# Patient Record
Sex: Male | Born: 1935 | Race: White | Hispanic: No | Marital: Married | Smoking: Former smoker
Health system: Southern US, Community
[De-identification: ages and names within clinical notes are randomized; demographics above are authoritative.]

## PROBLEM LIST (undated history)

## (undated) DIAGNOSIS — I10 Essential (primary) hypertension: Secondary | ICD-10-CM

## (undated) DIAGNOSIS — N189 Chronic kidney disease, unspecified: Secondary | ICD-10-CM

## (undated) DIAGNOSIS — F0390 Unspecified dementia without behavioral disturbance: Secondary | ICD-10-CM

## (undated) DIAGNOSIS — I2510 Atherosclerotic heart disease of native coronary artery without angina pectoris: Secondary | ICD-10-CM

## (undated) DIAGNOSIS — R0602 Shortness of breath: Secondary | ICD-10-CM

## (undated) DIAGNOSIS — N400 Enlarged prostate without lower urinary tract symptoms: Secondary | ICD-10-CM

## (undated) DIAGNOSIS — IMO0002 Reserved for concepts with insufficient information to code with codable children: Secondary | ICD-10-CM

## (undated) DIAGNOSIS — N183 Chronic kidney disease, stage 3 unspecified: Secondary | ICD-10-CM

## (undated) DIAGNOSIS — Z8719 Personal history of other diseases of the digestive system: Secondary | ICD-10-CM

## (undated) DIAGNOSIS — M1990 Unspecified osteoarthritis, unspecified site: Secondary | ICD-10-CM

## (undated) DIAGNOSIS — E7800 Pure hypercholesterolemia, unspecified: Secondary | ICD-10-CM

## (undated) DIAGNOSIS — I340 Nonrheumatic mitral (valve) insufficiency: Secondary | ICD-10-CM

## (undated) DIAGNOSIS — Z7901 Long term (current) use of anticoagulants: Secondary | ICD-10-CM

## (undated) DIAGNOSIS — I4891 Unspecified atrial fibrillation: Secondary | ICD-10-CM

## (undated) DIAGNOSIS — N200 Calculus of kidney: Secondary | ICD-10-CM

## (undated) DIAGNOSIS — R55 Syncope and collapse: Secondary | ICD-10-CM

## (undated) DIAGNOSIS — R9430 Abnormal result of cardiovascular function study, unspecified: Secondary | ICD-10-CM

## (undated) DIAGNOSIS — I219 Acute myocardial infarction, unspecified: Secondary | ICD-10-CM

## (undated) HISTORY — PX: SATURATION BIOPSY OF PROSTATE: SHX2375

## (undated) HISTORY — DX: Chronic kidney disease, unspecified: N18.9

## (undated) HISTORY — PX: LITHOTRIPSY: SUR834

## (undated) HISTORY — DX: Benign prostatic hyperplasia without lower urinary tract symptoms: N40.0

## (undated) HISTORY — PX: TONSILLECTOMY: SUR1361

## (undated) HISTORY — DX: Syncope and collapse: R55

## (undated) HISTORY — DX: Long term (current) use of anticoagulants: Z79.01

## (undated) HISTORY — PX: TRANSURETHRAL RESECTION OF PROSTATE: SHX73

---

## 2001-12-03 ENCOUNTER — Encounter: Admission: RE | Admit: 2001-12-03 | Discharge: 2001-12-03 | Payer: Self-pay | Admitting: Urology

## 2001-12-03 ENCOUNTER — Encounter: Payer: Self-pay | Admitting: Urology

## 2001-12-09 ENCOUNTER — Encounter: Admission: RE | Admit: 2001-12-09 | Discharge: 2001-12-09 | Payer: Self-pay | Admitting: Urology

## 2001-12-09 ENCOUNTER — Encounter: Payer: Self-pay | Admitting: Urology

## 2001-12-11 ENCOUNTER — Ambulatory Visit (HOSPITAL_BASED_OUTPATIENT_CLINIC_OR_DEPARTMENT_OTHER): Admission: RE | Admit: 2001-12-11 | Discharge: 2001-12-11 | Payer: Self-pay | Admitting: Urology

## 2001-12-11 ENCOUNTER — Encounter: Payer: Self-pay | Admitting: Urology

## 2002-08-12 ENCOUNTER — Ambulatory Visit (HOSPITAL_BASED_OUTPATIENT_CLINIC_OR_DEPARTMENT_OTHER): Admission: RE | Admit: 2002-08-12 | Discharge: 2002-08-12 | Payer: Self-pay | Admitting: Urology

## 2002-08-12 HISTORY — PX: CIRCUMCISION: SHX1350

## 2005-10-22 ENCOUNTER — Emergency Department (HOSPITAL_COMMUNITY): Admission: EM | Admit: 2005-10-22 | Discharge: 2005-10-23 | Payer: Self-pay | Admitting: Emergency Medicine

## 2010-12-03 DIAGNOSIS — N189 Chronic kidney disease, unspecified: Secondary | ICD-10-CM | POA: Insufficient documentation

## 2010-12-03 DIAGNOSIS — I10 Essential (primary) hypertension: Secondary | ICD-10-CM | POA: Insufficient documentation

## 2010-12-03 HISTORY — DX: Chronic kidney disease, unspecified: N18.9

## 2010-12-27 ENCOUNTER — Emergency Department (HOSPITAL_COMMUNITY): Payer: Medicare Other

## 2010-12-27 ENCOUNTER — Observation Stay (HOSPITAL_COMMUNITY)
Admission: EM | Admit: 2010-12-27 | Discharge: 2010-12-28 | Disposition: A | Discharge status: Home / Self Care | Payer: Medicare Other | Attending: Internal Medicine | Admitting: Internal Medicine

## 2010-12-27 DIAGNOSIS — N189 Chronic kidney disease, unspecified: Secondary | ICD-10-CM | POA: Insufficient documentation

## 2010-12-27 DIAGNOSIS — Z79899 Other long term (current) drug therapy: Secondary | ICD-10-CM | POA: Insufficient documentation

## 2010-12-27 DIAGNOSIS — Z7901 Long term (current) use of anticoagulants: Secondary | ICD-10-CM | POA: Insufficient documentation

## 2010-12-27 DIAGNOSIS — N400 Enlarged prostate without lower urinary tract symptoms: Secondary | ICD-10-CM | POA: Insufficient documentation

## 2010-12-27 DIAGNOSIS — I129 Hypertensive chronic kidney disease with stage 1 through stage 4 chronic kidney disease, or unspecified chronic kidney disease: Secondary | ICD-10-CM | POA: Insufficient documentation

## 2010-12-27 DIAGNOSIS — R55 Syncope and collapse: Principal | ICD-10-CM | POA: Insufficient documentation

## 2010-12-27 DIAGNOSIS — I4891 Unspecified atrial fibrillation: Secondary | ICD-10-CM | POA: Insufficient documentation

## 2010-12-27 HISTORY — DX: Essential (primary) hypertension: I10

## 2010-12-27 LAB — POCT I-STAT, CHEM 8
BUN: 21 mg/dL (ref 6–23)
Calcium, Ion: 1.14 mmol/L (ref 1.12–1.32)
Chloride: 110 mEq/L (ref 96–112)
Creatinine, Ser: 2.1 mg/dL — ABNORMAL HIGH (ref 0.50–1.35)
Glucose, Bld: 118 mg/dL — ABNORMAL HIGH (ref 70–99)
HCT: 39 % (ref 39.0–52.0)
Hemoglobin: 13.3 g/dL (ref 13.0–17.0)
Potassium: 3.8 mEq/L (ref 3.5–5.1)
Sodium: 142 mEq/L (ref 135–145)
TCO2: 23 mmol/L (ref 0–100)

## 2010-12-27 LAB — DIFFERENTIAL
Lymphocytes Relative: 10 % — ABNORMAL LOW (ref 12–46)
Lymphs Abs: 0.8 10*3/uL (ref 0.7–4.0)
Monocytes Absolute: 0.8 10*3/uL (ref 0.1–1.0)
Monocytes Relative: 11 % (ref 3–12)
Neutro Abs: 5.8 10*3/uL (ref 1.7–7.7)
Neutrophils Relative %: 76 % (ref 43–77)

## 2010-12-27 LAB — CBC
HCT: 40.6 % (ref 39.0–52.0)
Hemoglobin: 14.3 g/dL (ref 13.0–17.0)
MCH: 31.1 pg (ref 26.0–34.0)
MCHC: 35.2 g/dL (ref 30.0–36.0)
MCV: 88.3 fL (ref 78.0–100.0)
RBC: 4.6 MIL/uL (ref 4.22–5.81)

## 2010-12-27 LAB — GLUCOSE, CAPILLARY: Glucose-Capillary: 117 mg/dL — ABNORMAL HIGH (ref 70–99)

## 2010-12-27 LAB — PROTIME-INR: Prothrombin Time: 25.3 seconds — ABNORMAL HIGH (ref 11.6–15.2)

## 2010-12-27 LAB — TROPONIN I: Troponin I: <0.3 ng/mL (ref <0.30)

## 2010-12-28 ENCOUNTER — Encounter (HOSPITAL_COMMUNITY): Payer: Self-pay | Admitting: Radiology

## 2010-12-28 ENCOUNTER — Emergency Department (HOSPITAL_COMMUNITY): Payer: Medicare Other

## 2010-12-28 DIAGNOSIS — I059 Rheumatic mitral valve disease, unspecified: Secondary | ICD-10-CM

## 2010-12-28 LAB — HEPATIC FUNCTION PANEL
Alkaline Phosphatase: 60 U/L (ref 39–117)
Indirect Bilirubin: 0.2 mg/dL — ABNORMAL LOW (ref 0.3–0.9)
Total Protein: 7.2 g/dL (ref 6.0–8.3)

## 2010-12-28 LAB — CBC
HCT: 39.1 % (ref 39.0–52.0)
Platelets: 151 10*3/uL (ref 150–400)
RBC: 4.4 MIL/uL (ref 4.22–5.81)
RDW: 13.1 % (ref 11.5–15.5)
WBC: 7.3 10*3/uL (ref 4.0–10.5)

## 2010-12-28 LAB — CK TOTAL AND CKMB (NOT AT ARMC)
CK, MB: 4.3 ng/mL — ABNORMAL HIGH (ref 0.3–4.0)
Relative Index: 1.9 (ref 0.0–2.5)
Total CK: 229 U/L (ref 7–232)

## 2010-12-28 LAB — TECHNOLOGIST SMEAR REVIEW

## 2010-12-28 LAB — CARDIAC PANEL(CRET KIN+CKTOT+MB+TROPI)
CK, MB: 3.5 ng/mL (ref 0.3–4.0)
CK, MB: 3.8 ng/mL (ref 0.3–4.0)
Relative Index: 1.9 (ref 0.0–2.5)
Total CK: 175 U/L (ref 7–232)
Troponin I: <0.3 ng/mL (ref <0.30)

## 2010-12-28 LAB — COMPREHENSIVE METABOLIC PANEL
Albumin: 3.3 g/dL — ABNORMAL LOW (ref 3.5–5.2)
BUN: 19 mg/dL (ref 6–23)
Creatinine, Ser: 1.84 mg/dL — ABNORMAL HIGH (ref 0.50–1.35)
Total Protein: 6.6 g/dL (ref 6.0–8.3)

## 2010-12-28 LAB — DIFFERENTIAL
Basophils Absolute: 0 10*3/uL (ref 0.0–0.1)
Eosinophils Absolute: 0.1 10*3/uL (ref 0.0–0.7)
Eosinophils Relative: 2 % (ref 0–5)
Lymphocytes Relative: 21 % (ref 12–46)
Neutrophils Relative %: 67 % (ref 43–77)

## 2010-12-28 LAB — D-DIMER, QUANTITATIVE: D-Dimer, Quant: 0.37 ug/mL-FEU (ref 0.00–0.48)

## 2010-12-28 LAB — MAGNESIUM: Magnesium: 1.9 mg/dL (ref 1.5–2.5)

## 2010-12-28 LAB — TSH: TSH: 1.226 u[IU]/mL (ref 0.350–4.500)

## 2010-12-29 NOTE — Discharge Summary (Signed)
Troy Gregory, Troy Gregory NO.:  [id number]  MEDICAL RECORD NO.:  [id number]  LOCATION:  3712                         FACILITY:  [facility]  PHYSICIAN:  Valetta Close, M.D.   DATE OF BIRTH:  1936/02/07  DATE OF ADMISSION:  12/27/2010 DATE OF DISCHARGE:  12/28/2010                              DISCHARGE SUMMARY   DISCHARGE DIAGNOSES: 1. Near-syncope, etiology is deemed secondary to the excessive heat. 2. Atrial fibrillation with a stable rate, on no medications. 3. Hypertension, was initially orthostatic, but now was back to being     hypotensive. 4. Chronic kidney disease with a creatinine on discharge of 1.84, was     2.1 on admission. 5. History of benign prostatic hypertrophy, but stable.  DISCHARGE MEDICATIONS:  Unchanged and include, 1. Tramadol 50 mg by mouth every 6 hours as needed for pain. 2. Lisinopril 20 mg by mouth once a day. 3. Avodart 0.5 mg by mouth once a day. 4. Coumadin 2 mg, he takes 6 mg daily for 3 days and he takes 4 mg     daily for 2 days, and he keeps alternating.  His regimen is again     unchanged.  PROCEDURES PERFORMED DURING THIS HOSPITALIZATION:  A 2-D echo that was done on December 28, 2010, which showed an EF of 50% to 55%, mildly dilated pulmonary arteries, otherwise normal.  He also had a CT scan of the head on December 28, 2010 that was negative, partially opacified left nasal cavity and ethmoid air cells correlate clinically, is concern for sinusitis which I was not.  BRIEF ADMISSION HISTORY AND PHYSICAL:  Troy Gregory is a 75 year old male with a history as previously stated who was at church outside were it was quite hot, although there was a roof and some fans.  He has been standing for a long time, then he was sitting for a long time and then while getting up he felt very dizzy and almost passed out.  He sat back in his chair.  He felt like he had vertigo.  He was very diaphoretic and because of this, he was brought to the  emergency room where he was mildly orthostatic and given fluids when he was admitted for observation.  INITIAL LABS AND VITALS:  Blood pressure 156/104, pulse 80, temperature 98.5, O2 sat 98% on room air.  White count 7.6, hemoglobin 13, hematocrit 29, platelets 143, INR 2.2.  Sodium 142, potassium 3.2, chloride 110, glucose 118, bicarb was 23, BUN 21, creatinine 2.1.  For detailed history and physical, please refer to the admission details by Dr. Toniann Fail.  HOSPITAL COURSE: 1. Near-syncope.  He was worked up for this with again the CT scan     that was negative.  A 2-D echo was negative.  We will cycle cardiac     enzymes, they have remained negative.  He is no longer orthostatic.     In fact, if anything he is just plain hypotensive with his     medications being held.  I have stopped his fluids.  He remained     stable.  He is able to stand without difficulty.  He is not having  any return of his symptoms and he had no events on telemetry.     Because of this, I will allow him to go home today.  I will resume     his home medications some of them to take it easy in the heat     outside and again he will need to follow with his primary care     physician in 1 week to have a recheck of his blood pressure.  Even     though he is a little hypotensive now, I would not start new blood     pressure medications in the acute setting, certainly there might be     a white coat hypertension affect to this, I do not commit for     hypertension.  I see no neurological deficits, I will not pursue a     neuro workup.  I really seem like this was all secondary to heat. 2. Chronic kidney disease.  This is stable.  DISCHARGE LABS AND VITALS:  Temperature 98, heart rate 74, blood pressure 160/87, O2 sat 96% on room air.  White count 17, hemoglobin 14, hematocrit 29, platelets 151.  Sodium 134, potassium 3.5, bicarb 27, BUN 19, creatinine 1.8, glucose 111, calcium 9.2, magnesium 1.9, INR  2.26. TSH normal.  Approximate length of this discharge was approximately 30 minutes.     Valetta Close, M.D.     JC/MEDQ  D:  12/28/2010  T:  12/28/2010  Job:  [id number]  cc:   Courtney Paris, M.D. Windle Guard, M.D.  Electronically Signed by Valetta Close M.D. on 12/29/2010 01:58:42 PM

## 2011-02-06 NOTE — H&P (Signed)
NAMELUVERN, MCISAAC NO.:  [id number]  MEDICAL RECORD NO.:  [id number]  LOCATION:  [facility]                         FACILITY:  [facility]  PHYSICIAN:  Eduard Clos, MDDATE OF BIRTH:  [birth year]  DATE OF ADMISSION:  12/27/2010 DATE OF DISCHARGE:                             HISTORY & PHYSICAL   PRIMARY CARE PHYSICIAN:  Windle Guard, MD, [place].  CHIEF COMPLAINT:  Almost passed out.  HISTORY OF PRESENTING ILLNESS:  A 75 year old male, who was recently diagnosed with atrial fibrillation and is on Coumadin, was at church last evening around 8:30 when he was trying to stand up from a sitting position.  He felt very dizzy and almost passed out.  He sat back in the chair, had a spinning sensation but did not lose consciousness.  Denies any chest pain or shortness of breath or nausea, vomiting, or abdominal pain.  Denies any dysuria, discharges, or diarrhea, any focal deficit, headache, or visual symptoms.  Denies any difficulty speaking or swallowing.  In the ER, the patient was found to be mildly orthostatic and was given a 500 mL normal saline bolus.  At this time, the patient is admitted for further observation.  PAST MEDICAL HISTORY: 1. Recently diagnosed atrial fibrillation on Coumadin. 2. History of hypertension. 3. History of chronic kidney disease. 4. History of BPH.  PAST SURGICAL HISTORY:  None as per patient.  MEDICATIONS ON ADMISSION:  The patient is on Coumadin.  As per the chart, the patient is on Avodart, lisinopril, and tramadol.  We need to verify all this.  ALLERGIES:  No known drug allergies.  FAMILY HISTORY:  Significant for stroke and he states his father had some type of cancer which he cannot recall.  SOCIAL HISTORY:  The patient denies smoking cigarettes, drinking alcohol, or using illegal drugs.  REVIEW OF SYSTEMS:  As present in the history of presenting illness. Nothing else significant.  PHYSICAL EXAMINATION:   GENERAL:  The patient is examined at bedside, not in acute distress. VITAL SIGNS:  Blood pressure is 156/104, pulse 80 per minute, temperature 98.5, respirations 18 per minute, O2 sat is 98%.  HEENT: Anicteric.  No pallor.  No facial asymmetry.  No discharge from ears, eyes, nose, or mouth.  PERRLA positive.  Tongue is midline. CHEST:  Bilateral air entry present.  No rhonchi.  No crepitation. HEART:  S1, S2 heard. ABDOMEN:  Soft, nontender.  Bowel sounds heard. CNS:  The patient is alert, awake, oriented to time, place, and person. Moves upper and lower extremities 5/5.  There is no pronator drift.  No dysdiadochokinesia or any ataxia. EXTREMITIES:  Peripheral pulses felt.  No edema.  LABORATORY DATA:  EKG shows atrial fibrillation, rate is around 85 beats per minute with nonspecific ST-T changes.  Chest x-ray shows no acute disease.  CBC:  WBC is 7.6, hemoglobin is 13.3, hematocrit 39, platelets 143.  PT/INR is 25.3 and 2.2.  Basic metabolic panel:  Sodium 142, potassium 3.2, chloride 110, glucose 118, bicarb was 23, BUN 21, creatinine 2.1, CK-MB is 4.3, troponin less than 0.3.  ASSESSMENT: 1. Near syncope. 2. Recently diagnosed atrial fibrillation, is on Coumadin.  Presently  rate is relatively controlled. 3. Thrombocytopenia. 4. Chronic kidney disease.  His creatinine in 2007 was 2.2, presently     is 2.1. 5. History of hypertension, presently uncontrolled. 6. History of benign prostatic hypertrophy.  PLAN: 1. At this time, admit the patient to telemetry for further monitoring     to be able to rule out any arrhythmias or worsening of his AFib. 2. Near syncope.  The patient was found to be orthostatic.  The     patient was given some IV fluids.  We will continue with gentle     hydration at this time.  I am going to do cycle cardiac markers,     check D-dimer.  We will get a CT of the head without contrast, also     get a 2-D echo. 3. The patient does have  thrombocytopenia.  I am going to get     peripheral smear to check for schistocytes.  If there are     schistocytes then we need to get a Hematology consult for further     addressing his thrombocytopenia.  Presently, the patient does not     have any purpuric rash or fever. 4. Chronic kidney disease.  We need to closely follow his creatinine.     We will place the patient on strict intake, output and daily     weights. 5. Uncontrolled hypertension.  At this time, keeping the patient on     p.r.n. labetalol.  We need to verify his home medication and     continue if clinically appropriate. 6. Further recommendation as condition evolves.     Eduard Clos, MD     ANK/MEDQ  D:  12/28/2010  T:  12/28/2010  Job:  [id number]  cc:   Windle Guard, M.D.  Electronically Signed by Midge Minium MD on 02/06/2011 08:53:35 AM

## 2011-04-22 ENCOUNTER — Encounter (HOSPITAL_COMMUNITY): Payer: Self-pay

## 2011-04-22 ENCOUNTER — Other Ambulatory Visit: Payer: Self-pay

## 2011-04-22 ENCOUNTER — Emergency Department (HOSPITAL_COMMUNITY): Payer: Medicare Other

## 2011-04-22 ENCOUNTER — Emergency Department (HOSPITAL_COMMUNITY)
Admission: EM | Admit: 2011-04-22 | Discharge: 2011-04-22 | Disposition: A | Discharge status: Home / Self Care | Payer: Medicare Other | Attending: Emergency Medicine | Admitting: Emergency Medicine

## 2011-04-22 DIAGNOSIS — Z79899 Other long term (current) drug therapy: Secondary | ICD-10-CM | POA: Insufficient documentation

## 2011-04-22 DIAGNOSIS — I959 Hypotension, unspecified: Secondary | ICD-10-CM | POA: Insufficient documentation

## 2011-04-22 DIAGNOSIS — I10 Essential (primary) hypertension: Secondary | ICD-10-CM | POA: Insufficient documentation

## 2011-04-22 DIAGNOSIS — R404 Transient alteration of awareness: Secondary | ICD-10-CM | POA: Insufficient documentation

## 2011-04-22 DIAGNOSIS — R55 Syncope and collapse: Secondary | ICD-10-CM | POA: Insufficient documentation

## 2011-04-22 DIAGNOSIS — R0602 Shortness of breath: Secondary | ICD-10-CM | POA: Insufficient documentation

## 2011-04-22 DIAGNOSIS — Z7901 Long term (current) use of anticoagulants: Secondary | ICD-10-CM | POA: Insufficient documentation

## 2011-04-22 DIAGNOSIS — I4891 Unspecified atrial fibrillation: Secondary | ICD-10-CM | POA: Insufficient documentation

## 2011-04-22 DIAGNOSIS — R059 Cough, unspecified: Secondary | ICD-10-CM | POA: Insufficient documentation

## 2011-04-22 DIAGNOSIS — R05 Cough: Secondary | ICD-10-CM | POA: Insufficient documentation

## 2011-04-22 HISTORY — DX: Unspecified atrial fibrillation: I48.91

## 2011-04-22 LAB — PROTIME-INR
INR: 2.42 — ABNORMAL HIGH (ref 0.00–1.49)
Prothrombin Time: 26.7 seconds — ABNORMAL HIGH (ref 11.6–15.2)

## 2011-04-22 LAB — DIFFERENTIAL
Basophils Relative: 1 % (ref 0–1)
Monocytes Relative: 9 % (ref 3–12)
Neutro Abs: 6.1 10*3/uL (ref 1.7–7.7)
Neutrophils Relative %: 76 % (ref 43–77)

## 2011-04-22 LAB — BASIC METABOLIC PANEL
BUN: 22 mg/dL (ref 6–23)
Calcium: 9.2 mg/dL (ref 8.4–10.5)
Creatinine, Ser: 1.94 mg/dL — ABNORMAL HIGH (ref 0.50–1.35)
GFR calc non Af Amer: 33 mL/min — ABNORMAL LOW (ref >90)
Glucose, Bld: 101 mg/dL — ABNORMAL HIGH (ref 70–99)

## 2011-04-22 LAB — POCT I-STAT TROPONIN I: Troponin i, poc: 0 ng/mL (ref 0.00–0.08)

## 2011-04-22 LAB — CBC
Hemoglobin: 14.5 g/dL (ref 13.0–17.0)
MCH: 31.6 pg (ref 26.0–34.0)
MCHC: 34.2 g/dL (ref 30.0–36.0)
Platelets: 188 10*3/uL (ref 150–400)

## 2011-04-22 MED ORDER — SODIUM CHLORIDE 0.9 % IV BOLUS (SEPSIS)
500.0000 mL | Freq: Once | INTRAVENOUS | Status: AC
  Administered 2011-04-22: 500 mL via INTRAVENOUS

## 2011-04-22 NOTE — ED Notes (Signed)
Pt returned from xray, pt resting. NAD noted at this time.

## 2011-04-22 NOTE — ED Provider Notes (Signed)
History     CSN: [id number] Arrival date & time: 04/22/2011 12:51 PM   First MD Initiated Contact with Patient 04/22/11 1254    pt seen at 1300  Chief Complaint  Patient presents with  . Hypotension  . Loss of Consciousness    Patient is a 74 y.o. male presenting with syncope. The history is provided by the patient and the spouse.  Loss of Consciousness This is a new problem. The current episode started less than 1 hour ago. The problem occurs rarely. The problem has been resolved. Pertinent negatives include no chest pain. Associated symptoms comments: Reports recent cough/sob this past week . The symptoms are aggravated by nothing. The symptoms are relieved by nothing. Treatments tried: Iv fluids. The treatment provided moderate relief.   Pt was at church when he "slumped over" per wife and was unresponsive for about 2 minutes He ate normal meal this AM, but recently started lasix No seizure No head injury Now back to baseline after IV fluids Recent cough/congestion but no active CP at this time No focal weakness No Ha H/o syncope in the past, admitted earlier this yr for similar episode  Past Medical History  Diagnosis Date  . Hypertension   . Atrial fibrillation     History reviewed. No pertinent past surgical history.  History reviewed. No pertinent family history.  History  Substance Use Topics  . Smoking status: Former Smoker -- 45 years  . Smokeless tobacco: Not on file  . Alcohol Use: No      Review of Systems  Cardiovascular: Positive for syncope. Negative for chest pain.  All other systems reviewed and are negative.    Allergies  Review of patient's allergies indicates no known allergies.  Home Medications   Current Outpatient Rx  Name Route Sig Dispense Refill  . DUTASTERIDE 0.5 MG PO CAPS Oral Take 0.5 mg by mouth daily.      . FUROSEMIDE 40 MG PO TABS Oral Take 40 mg by mouth daily.     Marland Kitchen LISINOPRIL 20 MG PO TABS Oral Take 20 mg by mouth  daily.      Marland Kitchen NIACIN 100 MG PO TABS Oral Take 100 mg by mouth daily with breakfast.      . TRAMADOL HCL 50 MG PO TABS Oral Take 50 mg by mouth every 6 (six) hours as needed. Maximum dose= 8 tablets per day     . WARFARIN SODIUM 4 MG PO TABS Oral Take 4 mg by mouth daily.        BP 112/84  Pulse 79  Temp(Src) 97.6 F (36.4 C) (Oral)  Resp 16  SpO2 97%  Physical Exam  CONSTITUTIONAL: Well developed/well nourished HEAD AND FACE: Normocephalic/atraumatic EYES: EOMI/PERRL ENMT: Mucous membranes moist NECK: supple no meningeal signs SPINE:entire spine nontender CV: irregular no murmurs/rubs/gallops noted LUNGS: Lungs are clear to auscultation bilaterally, no apparent distress ABDOMEN: soft, nontender, no rebound or guarding GU:no cva tenderness NEURO: Pt is awake/alert, moves all extremitiesx4 No arm drift, facies symmetric EXTREMITIES: pulses normal, full ROM SKIN: warm, color normal PSYCH: no abnormalities of mood noted   ED Course  Procedures (   Labs Reviewed  CBC  DIFFERENTIAL  POCT I-STAT TROPONIN I  BASIC METABOLIC PANEL  I-STAT TROPONIN I  PROTIME-INR   Dg Chest 2 View  04/22/2011  *RADIOLOGY REPORT*  Clinical Data: Syncope.  Shortness of breath.  Hypertension.  CHEST - 2 VIEW  Comparison: 02/28/2011  Findings: Mild thoracic spondylosis noted.  There is  mild tortuosity of the thoracic aorta.  Borderline cardiomegaly noted.  No significant pulmonary abnormality is observed.  No pleural effusion noted.  IMPRESSION:  1.  Borderline cardiomegaly, without edema. 2.  Thoracic spondylosis. 3.  Aortic tortuosity.  Original Report Authenticated By: Dellia Cloud, M.D.    4:05 PM Pt observed in the ED Feels improved Taking PO, ambulatory, no hypotension No cp reported No HA/weakness reported He had extensive workup earlier this yr, no signs of aortic stenosis/CHF He has f/u with cardiology soon, and can f/u with PCP this week He recently had lasix started, I  asked him to hold this I offered admission, but he would rather go home since he had similar episode last July and negative workup BP 142/102  Pulse 100  Temp(Src) 97.6 F (36.4 C) (Oral)  Resp 22  SpO2 95%     MDM  Nursing notes reviewed and considered in documentation All labs/vitals reviewed and considered Previous records reviewed and considered - admission for syncope 12/2010, and had echo that showed EF at 50%, h/o afib. He is on coumadin, not on rate control meds xrays reviewed and considered    Date: 04/22/2011  Rate: 78  Rhythm: atrial fibrillation  QRS Axis: normal  Intervals: normal  ST/T Wave abnormalities: nonspecific ST changes  Conduction Disutrbances:none  Narrative Interpretation:   Old EKG Reviewed: unchanged from 12/27/10          Joya Gaskins, MD 04/22/11 1610

## 2011-04-22 NOTE — ED Notes (Signed)
Pt resting. NAD noted at this time. Family at bedside.

## 2011-04-22 NOTE — ED Notes (Signed)
Per ems- pt experienced syncopal episode in church this am. Upon arrival ems could not palpate or auscultate BP, howevber, after bolus, BP went up to 94/72. Pt has 18G IV in Left wrist. Pt is currently a&o x4.

## 2011-04-22 NOTE — ED Notes (Signed)
Patient transported to X-ray 

## 2011-04-22 NOTE — ED Notes (Signed)
Family at bedside. NAD noted at this time. Pt states no needs at this time.

## 2011-04-25 ENCOUNTER — Encounter: Payer: Self-pay | Admitting: Cardiology

## 2011-04-30 ENCOUNTER — Encounter: Payer: Self-pay | Admitting: Cardiology

## 2011-05-07 ENCOUNTER — Encounter: Payer: Self-pay | Admitting: Cardiology

## 2011-05-07 ENCOUNTER — Ambulatory Visit (INDEPENDENT_AMBULATORY_CARE_PROVIDER_SITE_OTHER): Payer: Medicare Other | Admitting: Cardiology

## 2011-05-07 VITALS — BP 110/60 | HR 89 | Ht 72.0 in | Wt 177.8 lb

## 2011-05-07 DIAGNOSIS — R0600 Dyspnea, unspecified: Secondary | ICD-10-CM

## 2011-05-07 DIAGNOSIS — I480 Paroxysmal atrial fibrillation: Secondary | ICD-10-CM | POA: Insufficient documentation

## 2011-05-07 DIAGNOSIS — N189 Chronic kidney disease, unspecified: Secondary | ICD-10-CM

## 2011-05-07 DIAGNOSIS — R0602 Shortness of breath: Secondary | ICD-10-CM

## 2011-05-07 DIAGNOSIS — R55 Syncope and collapse: Secondary | ICD-10-CM | POA: Insufficient documentation

## 2011-05-07 DIAGNOSIS — I10 Essential (primary) hypertension: Secondary | ICD-10-CM

## 2011-05-07 DIAGNOSIS — I4891 Unspecified atrial fibrillation: Secondary | ICD-10-CM

## 2011-05-07 NOTE — Assessment & Plan Note (Signed)
There is no overt evidence of congestive heart failure. Diuretic therapy has not improved his symptoms of dyspnea. I think this is only exacerbated his syncope. I recommended stopping his Lasix at this point and we will focus more on rhythm management. We will obtain a nuclear stress test to rule out ischemic heart disease.

## 2011-05-07 NOTE — Assessment & Plan Note (Signed)
We need to rule out significant bradycardia or pauses. Given the fact that he has a normal heart rate on no rate slowing medications suggest that he has electrical system disease of the heart. We will await the results of his event monitor. I have held his Lasix for the time being since this may exacerbate his symptoms.

## 2011-05-07 NOTE — Progress Notes (Signed)
[name] Date of Birth: May 10, 1936 Medical Record #[id number]  History of Present Illness: Mr. Alcock is seen at the request of Dr. Jeannetta Nap for evaluation of dyspnea. He is a 75 year old white male who reports he was in good health until May of this year when he developed atrial fibrillation. He was started on Coumadin at that time. He has not required any rate control medications. Over the past one to 2 months he reports increasing symptoms of dyspnea. He complains of marked fatigue. He has passed out twice at church. One episode occurred in July and another 2 weeks ago. Both times he did present to the hospital for evaluation. He's had extensive evaluation including blood work, echocardiogram, and CT of the head. His echocardiogram showed an ejection fraction of 50-55%. There was mild to moderate left atrial enlargement. There was mild pulmonary hypertension. His CT scan was unremarkable. Recent BNP level was elevated at 450. He was started on Lasix prior to his last episode of passing out. Since then his Lasix dose was reduced. He has noted no improvement in his dyspnea with Lasix. He did have a recent chest x-ray which showed no edema. He's had no increase in orthopnea or edema. In fact his weight has declined progressively at a rate of 4-5 pounds per month.  Current Outpatient Prescriptions on File Prior to Visit  Medication Sig Dispense Refill  . dutasteride (AVODART) 0.5 MG capsule Take 0.5 mg by mouth daily.       . furosemide (LASIX) 80 MG tablet Take 40 mg by mouth daily.        Marland Kitchen lisinopril (PRINIVIL,ZESTRIL) 20 MG tablet Take 20 mg by mouth daily.        . niacin 100 MG tablet Take 100 mg by mouth daily with breakfast.        . warfarin (COUMADIN) 2 MG tablet Take 4-6 mg by mouth daily. Take 6mg  on mon and thurs, 4mg  all other days.         No Known Allergies  Past Medical History  Diagnosis Date  . Hypertension 12/2010    was initially orthostatic, but now was back to being  hypotensive  . Atrial fibrillation     with a stable rate, on no medications  . Syncope     Near-syncope, etiology is deemed secondary to the excessive heat  . Chronic kidney disease 12/2010    with a creatinine on discharge of 1.84, was  2.1 on admission  . Benign prostatic hypertrophy     but stable    Past Surgical History  Procedure Date  . Circumcision 08/12/2002    because of phimosis    History  Smoking status  . Former Smoker -- 45 years  Smokeless tobacco  . Not on file    History  Alcohol Use No    Family History  Problem Relation Age of Onset  . Cancer Father   . Lung cancer Father   . Diabetes Father   . Hypertension Mother   . Hypertension Brother     Review of Systems: The review of systems is positive for fatigue.  He's had no chest pain or palpitations. He is currently wearing an event monitor. All other systems were reviewed and are negative.  Physical Exam: BP 110/60  Pulse 89  Ht 6' (1.829 m)  Wt 177 lb 12.8 oz (80.65 kg)  BMI 24.11 kg/m2 He is a pleasant elderly male in no acute distress.The patient is alert and oriented x  3.  The mood and affect are normal.  The skin is warm and dry.  Color is normal.  The HEENT exam reveals that the sclera are nonicteric.  The mucous membranes are moist.  The carotids are 2+ without bruits.  There is no thyromegaly.  There is no JVD.  The lungs are clear.  The chest wall is non tender.  The heart exam reveals an irregular rate with a normal S1 and S2.  There are no murmurs, gallops, or rubs.  The PMI is not displaced.   Abdominal exam reveals good bowel sounds.  There is no guarding or rebound.  There is no hepatosplenomegaly or tenderness.  There are no masses.  Exam of the legs reveal no clubbing, cyanosis, or edema.  The legs are without rashes.  The distal pulses are intact.  Cranial nerves II - XII are intact.  Motor and sensory functions are intact.  The gait is normal.  LABORATORY DATA: ECG today  demonstrates atrial fibrillation with voltage criteria for LVH. There is nonspecific T-wave abnormality. His INRs for the past month have been therapeutic.  Assessment / Plan:

## 2011-05-07 NOTE — Patient Instructions (Signed)
We will schedule you for a nuclear stress test.  Stop taking Lasix for now.  Continue your other medication.  Continue wearing the monitor.  I will have you see Lawson Fiscal back in 2 weeks.

## 2011-05-07 NOTE — Assessment & Plan Note (Signed)
Rate is well controlled on no rate slowing medication. He is on chronic anticoagulation. I am concerned that his atrial fibrillation is the primary cause of his dyspnea although he has no overt evidence of CHF. He is to continue with his event monitor. I recommended a nuclear stress test to rule out ischemic heart disease. We will see him back in 2 weeks. If these studies are unremarkable I would plan on proceeding with a DC cardioversion. Given the fact that he has been in atrial fibrillation for the past 6-7 months I think it is likely he will require antiarrhythmic drug therapy to maintain sinus rhythm. If his stress test is normal then flecainide might be a good choice. Given his chronic renal insufficiency he is not a good candidate for sotalol or Tikosyn.

## 2011-05-09 ENCOUNTER — Other Ambulatory Visit (HOSPITAL_COMMUNITY): Payer: Medicare Other | Admitting: Radiology

## 2011-05-09 ENCOUNTER — Ambulatory Visit (HOSPITAL_COMMUNITY): Payer: Medicare Other | Attending: Cardiovascular Disease | Admitting: Radiology

## 2011-05-09 VITALS — Ht 72.0 in | Wt 178.0 lb

## 2011-05-09 DIAGNOSIS — Z87891 Personal history of nicotine dependence: Secondary | ICD-10-CM | POA: Insufficient documentation

## 2011-05-09 DIAGNOSIS — R0989 Other specified symptoms and signs involving the circulatory and respiratory systems: Secondary | ICD-10-CM | POA: Insufficient documentation

## 2011-05-09 DIAGNOSIS — R55 Syncope and collapse: Secondary | ICD-10-CM

## 2011-05-09 DIAGNOSIS — I10 Essential (primary) hypertension: Secondary | ICD-10-CM | POA: Insufficient documentation

## 2011-05-09 DIAGNOSIS — Z8249 Family history of ischemic heart disease and other diseases of the circulatory system: Secondary | ICD-10-CM | POA: Insufficient documentation

## 2011-05-09 DIAGNOSIS — R0609 Other forms of dyspnea: Secondary | ICD-10-CM | POA: Insufficient documentation

## 2011-05-09 DIAGNOSIS — I4891 Unspecified atrial fibrillation: Secondary | ICD-10-CM

## 2011-05-09 DIAGNOSIS — I2789 Other specified pulmonary heart diseases: Secondary | ICD-10-CM | POA: Insufficient documentation

## 2011-05-09 DIAGNOSIS — R0602 Shortness of breath: Secondary | ICD-10-CM

## 2011-05-09 DIAGNOSIS — R5381 Other malaise: Secondary | ICD-10-CM | POA: Insufficient documentation

## 2011-05-09 MED ORDER — TECHNETIUM TC 99M TETROFOSMIN IV KIT
11.0000 | PACK | Freq: Once | INTRAVENOUS | Status: AC | PRN
  Administered 2011-05-09: 11 via INTRAVENOUS

## 2011-05-09 MED ORDER — REGADENOSON 0.4 MG/5ML IV SOLN
0.4000 mg | Freq: Once | INTRAVENOUS | Status: AC
  Administered 2011-05-09: 0.4 mg via INTRAVENOUS

## 2011-05-09 MED ORDER — TECHNETIUM TC 99M TETROFOSMIN IV KIT
33.0000 | PACK | Freq: Once | INTRAVENOUS | Status: AC | PRN
  Administered 2011-05-09: 33 via INTRAVENOUS

## 2011-05-09 NOTE — Progress Notes (Signed)
[facility] SITE 3 NUCLEAR MED [address] [place] [address] [phone]  Cardiology Nuclear Med Study  Troy Gregory is a 75 y.o. male [id number] 18-Apr-1936   Nuclear Med Background Indication for Stress Test:  Evaluation for Ischemia History: 12/22/10 Echo:EF=50-55%, mild pulmonary HTN, new afib. Since 5/12 Cardiac Risk Factors: Family History - CAD, History of Smoking and Hypertension  Symptoms:  DOE, Fatigue and Syncope   Nuclear Pre-Procedure Caffeine/Decaff Intake:  None NPO After: 8:00pm   Lungs:  Clear.  O2 SAT 97% on RA IV 0.9% NS with Angio Cath:  20g  IV Site: R Antecubital  IV Started by:  Bonnita Levan, RN  Chest Size (in):  44 Cup Size: N/A  Height: 6' (1.829 m)  Weight:  178 lb (80.74 kg)  BMI:  Body mass index is 24.14 kg/(m^2). Tech Comments:  N/A    Nuclear Med Study 1 or 2 day study: 1 day  Stress Test Type:  Lexiscan  Reading MD: Kristeen Miss, MD  Order Authorizing Provider:  Peter [place], MD  Resting Radionuclide: Technetium 88m Tetrofosmin  Resting Radionuclide Dose: 11.0 mCi   Stress Radionuclide:  Technetium 43m Tetrofosmin  Stress Radionuclide Dose: 33.0 mCi           Stress Protocol Rest HR: 83 Stress HR: 99  Rest BP: 110/60 Stress BP: 120/64  Exercise Time (min): n/a METS: n/a   Predicted Max HR: 147 bpm % Max HR: 67.35 bpm Rate Pressure Product: 21308   Dose of Adenosine (mg):  n/a Dose of Lexiscan: 0.4 mg  Dose of Atropine (mg): n/a Dose of Dobutamine: n/a mcg/kg/min (at max HR)  Stress Test Technologist: Smiley Houseman, CMA-N  Nuclear Technologist:  Domenic Polite, CNMT     Rest Procedure:  Myocardial perfusion imaging was performed at rest 45 minutes following the intravenous administration of Technetium 46m Tetrofosmin.  Rest ECG: Atrial Fibrilliation with occasional PVC's.  Stress Procedure:  The patient received IV Lexiscan 0.4 mg over 15-seconds.  Technetium 73m Tetrofosmin injected at 30-seconds.   There were no significant changes with Lexiscan, other than frequent PVC's.  Quantitative spect images were obtained after a 45 minute delay.  Stress ECG: No significant change from baseline ECG  QPS Raw Data Images:  Normal; no motion artifact; normal heart/lung ratio. Stress Images:  There is mild apical thinning with normal uptake in other regions. Rest Images:  There is mild apical thinning with normal uptake in other regions. Subtraction (SDS):  No evidence of ischemia. Transient Ischemic Dilatation (Normal <1.22):  1.03 Lung/Heart Ratio (Normal <0.45):  0.39  Quantitative Gated Spect Images QGS EDV:  207 ml QGS ESV:  159 ml QGS cine images:  There is severe, global LV dysfunction.  The LV is markedly dilated. QGS EF: 23%  Impression Exercise Capacity:  Lexiscan with no exercise. BP Response:  Normal blood pressure response. Clinical Symptoms:  No chest pain. ECG Impression:  No significant ST segment change suggestive of ischemia. Comparison with Prior Nuclear Study: No previous nuclear study performed  Overall Impression:  Abnormal stress nuclear study.  There is no evidence of ischemia.  The LV is markedly dilated and hypocontractile with an EF of 23%.   [name], [name]., MD, [facility] 05/09/2011, 6:25 PM

## 2011-05-11 ENCOUNTER — Institutional Professional Consult (permissible substitution): Payer: Medicare Other | Admitting: Cardiology

## 2011-05-11 NOTE — Progress Notes (Signed)
lmtcb

## 2011-05-14 ENCOUNTER — Telehealth: Payer: Self-pay | Admitting: *Deleted

## 2011-05-14 ENCOUNTER — Other Ambulatory Visit: Payer: Self-pay | Admitting: Cardiology

## 2011-05-14 ENCOUNTER — Other Ambulatory Visit: Payer: Self-pay | Admitting: *Deleted

## 2011-05-14 ENCOUNTER — Telehealth: Payer: Self-pay | Admitting: Cardiology

## 2011-05-14 DIAGNOSIS — I4891 Unspecified atrial fibrillation: Secondary | ICD-10-CM

## 2011-05-14 NOTE — Telephone Encounter (Signed)
New message:  Wife called and would like to get results of test done on Weds.  Please call her back with results.

## 2011-05-14 NOTE — Telephone Encounter (Signed)
Message copied by Lorayne Bender on Mon May 14, 2011 12:12 PM ------      Message from: [place], PETER M      Created: Fri May 11, 2011  1:06 PM       No ischemia. EF is markedly decreased at 23%. This is quite a drop from his Echo in July 2012. I reviewed Echo- images limited. I think we need to repeat Echo with Definity contrast.       Peter [place]

## 2011-05-14 NOTE — Telephone Encounter (Signed)
Message copied by Lorayne Bender on Mon May 14, 2011  3:03 PM ------      Message from: [place], PETER M      Created: Fri May 11, 2011  1:06 PM       No ischemia. EF is markedly decreased at 23%. This is quite a drop from his Echo in July 2012. I reviewed Echo- images limited. I think we need to repeat Echo with Definity contrast.       Peter [place]

## 2011-05-14 NOTE — Telephone Encounter (Signed)
Correction. Spoke w/wife and gave results of stress test. Will send to Dr. Jeannetta Nap.

## 2011-05-14 NOTE — Telephone Encounter (Signed)
Spoke w/wife and gave results of echo. Advised will need to get a limited Echo to evaluate EF. Will send order to Scheduler to call her back.

## 2011-05-14 NOTE — Telephone Encounter (Signed)
Wife called back requesting stress test results. Advised Dr. Swaziland would like a limited Echo w/Definity. Will send request for schedulers. Wife states anytime OK.

## 2011-05-16 ENCOUNTER — Ambulatory Visit (HOSPITAL_COMMUNITY): Payer: Medicare Other | Attending: Cardiology | Admitting: Radiology

## 2011-05-16 ENCOUNTER — Other Ambulatory Visit (HOSPITAL_COMMUNITY): Payer: Medicare Other | Admitting: Radiology

## 2011-05-16 DIAGNOSIS — I059 Rheumatic mitral valve disease, unspecified: Secondary | ICD-10-CM | POA: Insufficient documentation

## 2011-05-16 DIAGNOSIS — I359 Nonrheumatic aortic valve disorder, unspecified: Secondary | ICD-10-CM | POA: Insufficient documentation

## 2011-05-16 DIAGNOSIS — I4891 Unspecified atrial fibrillation: Secondary | ICD-10-CM | POA: Insufficient documentation

## 2011-05-16 DIAGNOSIS — I079 Rheumatic tricuspid valve disease, unspecified: Secondary | ICD-10-CM | POA: Insufficient documentation

## 2011-05-17 ENCOUNTER — Telehealth: Payer: Self-pay | Admitting: *Deleted

## 2011-05-17 NOTE — Telephone Encounter (Signed)
Notified wife of Echo results. To see Lawson Fiscal on Monday with Dr. Sw[place]iland to discuss further treatment. Will send copy to Dr. Jeannetta Nap.

## 2011-05-17 NOTE — Telephone Encounter (Signed)
Message copied by Lorayne Bender on Thu May 17, 2011  9:40 AM ------      Message from: [place], PETER M      Created: Wed May 16, 2011  1:02 PM       EF has decreased significantly since July. Now severely reduced. We need to see him back to optimize his medical therapy and to consider cath.      Theron Arista [place]

## 2011-05-17 NOTE — Telephone Encounter (Signed)
Lm that the call was about his Echo results which I had called her about this AM

## 2011-05-17 NOTE — Telephone Encounter (Signed)
FU CAll: Pt wife returning call from our office from yesterday. Pt wife doesn't know if call was regarding results of pt ECHO. Please return pt call to discuss further.

## 2011-05-21 ENCOUNTER — Encounter: Payer: Self-pay | Admitting: Nurse Practitioner

## 2011-05-21 ENCOUNTER — Encounter (HOSPITAL_COMMUNITY): Payer: Self-pay | Admitting: Pharmacy Technician

## 2011-05-21 ENCOUNTER — Ambulatory Visit (INDEPENDENT_AMBULATORY_CARE_PROVIDER_SITE_OTHER): Payer: Medicare Other | Admitting: Nurse Practitioner

## 2011-05-21 ENCOUNTER — Other Ambulatory Visit: Payer: Self-pay | Admitting: Nurse Practitioner

## 2011-05-21 ENCOUNTER — Other Ambulatory Visit: Payer: Self-pay | Admitting: *Deleted

## 2011-05-21 DIAGNOSIS — I5031 Acute diastolic (congestive) heart failure: Secondary | ICD-10-CM

## 2011-05-21 DIAGNOSIS — I5020 Unspecified systolic (congestive) heart failure: Secondary | ICD-10-CM

## 2011-05-21 DIAGNOSIS — R0989 Other specified symptoms and signs involving the circulatory and respiratory systems: Secondary | ICD-10-CM

## 2011-05-21 DIAGNOSIS — R0600 Dyspnea, unspecified: Secondary | ICD-10-CM

## 2011-05-21 DIAGNOSIS — R55 Syncope and collapse: Secondary | ICD-10-CM

## 2011-05-21 DIAGNOSIS — R0609 Other forms of dyspnea: Secondary | ICD-10-CM

## 2011-05-21 DIAGNOSIS — I4891 Unspecified atrial fibrillation: Secondary | ICD-10-CM

## 2011-05-21 NOTE — Assessment & Plan Note (Addendum)
He remains in atrial fib. He is on coumadin but we are going to stop and arrange for left/right heart catheterization.   His INR on 12/17 was 3.2. Coumadin was held. He was told on 12/18 to increase his greens. Did not get the message. Spoke with Dr. Sw[place]iland. Will give 5 mg of Vitamin K x one dose. Recheck INR here Friday am. Dr. Clifton James will proceed with an INR of 1.8 or less.

## 2011-05-21 NOTE — Assessment & Plan Note (Signed)
He has an event monitor in place per Dr. Jeannetta Nap.

## 2011-05-21 NOTE — Patient Instructions (Addendum)
Stop taking your coumadin. We will check your labs today.   We are going to arrange for a heart catheterization this Friday with Dr. Clifton James at 9:30am  You are scheduled for an outpatient cardiac catheterization on Friday with Dr. Clifton James or associates.  Go the [facility] at [facility] this Friday at 8:30am.  Call the Heart & Vascular Center at [phone] if you are unable to make your appointment.  The code to get into the parking garage under the building is 0006. You must have someone available to drive you home. Someone needs to be with you for the first 24 hours after you arrive home. Please wear clothes that are easy to get on and off.  Do not eat or drink after midnight on Thursday. You may have water only with your medications on the morning of your procedure.    Coronary Angiography Coronary angiography is an X-ray procedure used to look at the arteries in the heart. In this procedure, a dye is injected through a long, hollow tube (catheter). The catheter is about the size of a piece of cooked spaghetti. The catheter injects a dye into an artery in your groin. X-rays are then taken to show if there is a blockage in the arteries of your heart. BEFORE THE PROCEDURE   Let your caregiver know if you have allergies to shellfish or contrast dye. Also let your caregiver know if you have kidney problems or failure.   Do not eat or drink starting from midnight up to the time of the procedure, or as directed.   You may drink enough water to take your medications the morning of the procedure if you were instructed to do so.   You should be at the hospital or outpatient facility where the procedure is to be done 60 minutes prior to the procedure or as directed.  PROCEDURE  You may be given an IV medication to help you relax before the procedure.   You will be prepared for the procedure by washing and shaving the area where the catheter will be inserted. This is usually  done in the groin but may be done in the fold of your arm by your elbow.   A medicine will be given to numb your groin where the catheter will be inserted.   A specially trained doctor will insert the catheter into an artery in your groin. The catheter is guided by using a special type of X-ray (fluoroscopy) to the blood vessel being examined.   A special dye is then injected into the catheter and X-rays are taken. The dye helps to show where any narrowing or blockages are located in the heart arteries.  AFTER THE PROCEDURE   After the procedure you will be kept in bed lying flat for several hours. You will be instructed to not bend or cross your legs.   The groin insertion site will be watched and checked frequently.   The pulse in your feet will be checked frequently.   Additional blood tests, X-rays and an EKG may be done.   You may stay in the hospital overnight for observation.  SEEK IMMEDIATE MEDICAL CARE IF:   You develop chest pain, shortness of breath, feel faint, or pass out.   There is bleeding, swelling, or drainage from the catheter insertion site.   You develop pain, discoloration, coldness, or severe bruising in the leg or area where the catheter was inserted.   You have a fever.  Document  Released: 11/25/2002 Document Revised: 01/31/2011 Document Reviewed: 01/14/2008 [facility] Patient Information 2012 [place], [place].

## 2011-05-21 NOTE — Progress Notes (Signed)
 Osmar P Paino Date of Birth: 05/12/1936 Medical Record #[id number]  History of Present Illness: Troy Gregory is seen today for a work in visit. He is seen with Dr. Jordan. He has atrial fibrillation that was noted back in May. Coumadin was started. He did not require rate control. He has had 2 episodes of syncope occuring at church. One was in July and the second was back in November. He has an event monitor in place. He has had extensive evaluation including blood work, echo and CT of the head. His echo at that time showed an EF of 50 to 55%. He had mild to moderate LAE and mild pulmonary HTN. BNP recently was elevated at 450. He has actually lost weight. He has been taken off of his Lasix. He then had a myoview which showed no ischemia but a marked reduction of his EF at 23%. He then had an echo with definity showing reduced EF as well. We are now referring on for left and right heart catheterization.   His wife notes that he tends to downplay his symptoms. She notes more DOE. He is fatigued. He sits most of the day. No swelling. Appetite is poor. Does not feel bloated. No cough/PND/orthopnea. No chest pain reported.   Current Outpatient Prescriptions on File Prior to Visit  Medication Sig Dispense Refill  . dutasteride (AVODART) 0.5 MG capsule Take 0.5 mg by mouth daily.       . lisinopril (PRINIVIL,ZESTRIL) 20 MG tablet Take 20 mg by mouth daily.        . warfarin (COUMADIN) 2 MG tablet Take 4-6 mg by mouth daily. Take 6mg on mon and thurs, 4mg all other days.         No Known Allergies  Past Medical History  Diagnosis Date  . Hypertension 12/2010    was initially orthostatic, but now was back to being hypotensive  . Atrial fibrillation     with a stable rate, on no medications  . Syncope     Near-syncope, etiology is deemed secondary to the excessive heat  . Chronic kidney disease 12/2010    with a creatinine on discharge of 1.84, was  2.1 on admission  . Benign prostatic hypertrophy    but stable    Past Surgical History  Procedure Date  . Circumcision 08/12/2002    because of phimosis    History  Smoking status  . Former Smoker -- 45 years  Smokeless tobacco  . Not on file    History  Alcohol Use No    Family History  Problem Relation Age of Onset  . Cancer Father   . Lung cancer Father   . Diabetes Father   . Hypertension Mother   . Hypertension Brother     Review of Systems: The review of systems is per the HPI.  All other systems were reviewed and are negative.  Physical Exam: BP 128/104  Pulse 94  Ht 6' (1.829 m)  Wt 178 lb (80.74 kg)  BMI 24.14 kg/m2 Patient is very pleasant and in no acute distress. He does look ill. Skin is warm and dry. Color is normal.  HEENT is unremarkable. Normocephalic/atraumatic. PERRL. Sclera are nonicteric. Neck is supple. No masses. No JVD. Lungs are clear. Cardiac exam shows an irregular rhythm. Abdomen is soft. Extremities are without edema. Gait and ROM are intact. No gross neurologic deficits noted.    Lab Results  Component Value Date   WBC 8.1 04/22/2011   HGB 14.5 04/22/2011     HCT 42.4 04/22/2011   PLT 188 04/22/2011   GLUCOSE 101* 04/22/2011   ALT 16 12/28/2010   AST 21 12/28/2010   NA 142 04/22/2011   K 3.7 04/22/2011   CL 101 04/22/2011   CREATININE 1.94* 04/22/2011   BUN 22 04/22/2011   CO2 30 04/22/2011   TSH 1.226 12/28/2010   INR 2.42* 04/22/2011   Dg Chest 2 View  04/22/2011  *RADIOLOGY REPORT*  Clinical Data: Syncope.  Shortness of breath.  Hypertension.  CHEST - 2 VIEW  Comparison: 02/28/2011  Findings: Mild thoracic spondylosis noted.  There is mild tortuosity of the thoracic aorta.  Borderline cardiomegaly noted.  No significant pulmonary abnormality is observed.  No pleural effusion noted.  IMPRESSION:  1.  Borderline cardiomegaly, without edema. 2.  Thoracic spondylosis. 3.  Aortic tortuosity.  Original Report Authenticated By: WALTER D. LIEBKEMANN, M.D.   EKG today shows atrial  fib. Rate is 94. Tracing is reviewed with Dr. Jordan.   Assessment / Plan:  

## 2011-05-21 NOTE — Assessment & Plan Note (Signed)
Now noted to have marked reduction in his EF. We are proceeding on with left/right heart cath. Can hold on the ventriculogram due to his kidney function. He would need a staged procedure if PCI is warranted. Further disposition to follow.

## 2011-05-22 ENCOUNTER — Telehealth: Payer: Self-pay | Admitting: *Deleted

## 2011-05-22 ENCOUNTER — Telehealth: Payer: Self-pay | Admitting: Cardiology

## 2011-05-22 LAB — CBC WITH DIFFERENTIAL/PLATELET
Basophils Absolute: 0.1 10*3/uL (ref 0.0–0.1)
Basophils Relative: 0.7 % (ref 0.0–3.0)
Eosinophils Absolute: 0.2 10*3/uL (ref 0.0–0.7)
Eosinophils Relative: 2 % (ref 0.0–5.0)
HCT: 41.9 % (ref 39.0–52.0)
Hemoglobin: 14.3 g/dL (ref 13.0–17.0)
Lymphocytes Relative: 15.2 % (ref 12.0–46.0)
Lymphs Abs: 1.2 10*3/uL (ref 0.7–4.0)
MCHC: 34.1 g/dL (ref 30.0–36.0)
MCV: 92.3 fl (ref 78.0–100.0)
Monocytes Absolute: 0.8 10*3/uL (ref 0.1–1.0)
Monocytes Relative: 10.5 % (ref 3.0–12.0)
Neutro Abs: 5.7 10*3/uL (ref 1.4–7.7)
Neutrophils Relative %: 71.6 % (ref 43.0–77.0)
Platelets: 158 10*3/uL (ref 150.0–400.0)
RBC: 4.54 Mil/uL (ref 4.22–5.81)
RDW: 13 % (ref 11.5–14.6)
WBC: 7.9 10*3/uL (ref 4.5–10.5)

## 2011-05-22 LAB — TSH: TSH: 1.38 u[IU]/mL (ref 0.35–5.50)

## 2011-05-22 LAB — BASIC METABOLIC PANEL
BUN: 25 mg/dL — ABNORMAL HIGH (ref 6–23)
CO2: 29 mEq/L (ref 19–32)
Calcium: 9.3 mg/dL (ref 8.4–10.5)
Chloride: 107 mEq/L (ref 96–112)
Creatinine, Ser: 2 mg/dL — ABNORMAL HIGH (ref 0.4–1.5)
GFR: 35.17 mL/min — ABNORMAL LOW (ref >60.00)
Glucose, Bld: 108 mg/dL — ABNORMAL HIGH (ref 70–99)
Potassium: 4.4 mEq/L (ref 3.5–5.1)
Sodium: 143 mEq/L (ref 135–145)

## 2011-05-22 LAB — PROTIME-INR
INR: 3.2 ratio — ABNORMAL HIGH (ref 0.8–1.0)
Prothrombin Time: 35.8 s — ABNORMAL HIGH (ref 10.2–12.4)

## 2011-05-22 LAB — APTT: aPTT: 35.7 s — ABNORMAL HIGH (ref 21.7–28.8)

## 2011-05-22 NOTE — Telephone Encounter (Signed)
Spoke w/wife. Advised to be here in our office at 7:45 am Friday. Will check INR here and if therapeutic will send to [facility] cath lab as planned.

## 2011-05-22 NOTE — Telephone Encounter (Signed)
Fu call Pt's wife was calling back from yesterday

## 2011-05-22 NOTE — Telephone Encounter (Signed)
Lm at home # for him to eat A LOT of green vegetables today and tomorrow. INR was 3.2. Lm to call back to make sure they got message. Will repeat INR Friday AM.

## 2011-05-23 ENCOUNTER — Telehealth: Payer: Self-pay | Admitting: *Deleted

## 2011-05-23 MED ORDER — PHYTONADIONE 5 MG PO TABS: 5.0000 mg | ORAL_TABLET | Freq: Once | ORAL | Status: DC

## 2011-05-23 NOTE — Telephone Encounter (Signed)
Notified of lab results. Advised to eat a lot of greens today and Thursday. Per Dr. [place] will give Vitamin K 5 mg PO today.

## 2011-05-23 NOTE — Telephone Encounter (Signed)
Message copied by Lorayne Bender on Wed May 23, 2011 12:45 PM ------      Message from: Rosalio Macadamia      Created: Wed May 23, 2011 12:36 PM       Have reviewed with Dr. Sw[place]il[place] and Dr. Clifton James. Hoping for INR less than or equal to 1.8 on Friday. Was told to increase his intake of greens. Coumadin is currently on hold.  No LV gram ordered.

## 2011-05-24 NOTE — Telephone Encounter (Signed)
Spoke w/wife. States he did take Vitamin K last PM and has eaten [place] and broccoli last PM and today. Will be here at 7:45 in AM for INR check and then will decide if will have cath.

## 2011-05-24 NOTE — Telephone Encounter (Signed)
Fu call °Pt returning your call  °

## 2011-05-25 ENCOUNTER — Inpatient Hospital Stay (HOSPITAL_BASED_OUTPATIENT_CLINIC_OR_DEPARTMENT_OTHER)
Admission: RE | Admit: 2011-05-25 | Discharge: 2011-05-25 | Disposition: A | Discharge status: Home / Self Care | Payer: Medicare Other | Source: Ambulatory Visit | Attending: Cardiovascular Disease | Admitting: Cardiovascular Disease

## 2011-05-25 ENCOUNTER — Encounter (HOSPITAL_BASED_OUTPATIENT_CLINIC_OR_DEPARTMENT_OTHER)
Admission: RE | Disposition: A | Discharge status: Home / Self Care | Payer: Self-pay | Source: Ambulatory Visit | Attending: Cardiovascular Disease

## 2011-05-25 ENCOUNTER — Other Ambulatory Visit: Payer: Self-pay | Admitting: Cardiovascular Disease

## 2011-05-25 ENCOUNTER — Ambulatory Visit (INDEPENDENT_AMBULATORY_CARE_PROVIDER_SITE_OTHER): Payer: Self-pay | Admitting: *Deleted

## 2011-05-25 DIAGNOSIS — I129 Hypertensive chronic kidney disease with stage 1 through stage 4 chronic kidney disease, or unspecified chronic kidney disease: Secondary | ICD-10-CM | POA: Insufficient documentation

## 2011-05-25 DIAGNOSIS — I4891 Unspecified atrial fibrillation: Secondary | ICD-10-CM

## 2011-05-25 DIAGNOSIS — I2510 Atherosclerotic heart disease of native coronary artery without angina pectoris: Secondary | ICD-10-CM

## 2011-05-25 DIAGNOSIS — I509 Heart failure, unspecified: Secondary | ICD-10-CM | POA: Insufficient documentation

## 2011-05-25 DIAGNOSIS — R0989 Other specified symptoms and signs involving the circulatory and respiratory systems: Secondary | ICD-10-CM

## 2011-05-25 DIAGNOSIS — N189 Chronic kidney disease, unspecified: Secondary | ICD-10-CM | POA: Insufficient documentation

## 2011-05-25 LAB — POCT I-STAT 3, ART BLOOD GAS (G3+)
Bicarbonate: 23.7 mEq/L (ref 20.0–24.0)
pCO2 arterial: 36.5 mmHg (ref 35.0–45.0)
pH, Arterial: 7.422 (ref 7.350–7.450)
pO2, Arterial: 63 mmHg — ABNORMAL LOW (ref 80.0–100.0)

## 2011-05-25 LAB — POCT I-STAT 3, VENOUS BLOOD GAS (G3P V): pH, Ven: 7.349 — ABNORMAL HIGH (ref 7.250–7.300)

## 2011-05-25 LAB — POCT INR: INR: 1.4

## 2011-05-25 SURGERY — JV LEFT HEART CATHETERIZATION WITH CORONARY ANGIOGRAM
Anesthesia: Moderate Sedation

## 2011-05-25 SURGERY — JV LEFT AND RIGHT HEART CATHETERIZATION WITH CORONARY ANGIOGRAM
Anesthesia: Moderate Sedation

## 2011-05-25 MED ORDER — ASPIRIN 81 MG PO CHEW
324.0000 mg | CHEWABLE_TABLET | ORAL | Status: AC
  Administered 2011-05-25: 324 mg via ORAL

## 2011-05-25 MED ORDER — DIAZEPAM 5 MG PO TABS
5.0000 mg | ORAL_TABLET | ORAL | Status: AC
  Administered 2011-05-25: 5 mg via ORAL

## 2011-05-25 MED ORDER — SODIUM CHLORIDE 0.9 % IV SOLN
INTRAVENOUS | Status: DC
  Administered 2011-05-25: 09:00:00 via INTRAVENOUS

## 2011-05-25 MED ORDER — SODIUM CHLORIDE 0.9 % IV SOLN: 1.0000 mL/kg/h | INTRAVENOUS | Status: DC

## 2011-05-25 MED ORDER — ATORVASTATIN CALCIUM 40 MG PO TABS: 40.0000 mg | ORAL_TABLET | Freq: Every day | ORAL | Status: DC

## 2011-05-25 MED ORDER — ONDANSETRON HCL 4 MG/2ML IJ SOLN: 4.0000 mg | Freq: Four times a day (QID) | INTRAMUSCULAR | Status: DC | PRN

## 2011-05-25 MED ORDER — ACETAMINOPHEN 325 MG PO TABS: 650.0000 mg | ORAL_TABLET | ORAL | Status: DC | PRN

## 2011-05-25 NOTE — Op Note (Signed)
    Cardiac Cath Note  Troy Gregory [id number] 12-12-1935  Procedure: Right and left Heart Cardiac Catheterization Note Indications: Congestive heart failure  Procedure Details Consent: Obtained Time Out: Verified patient identification, verified procedure, site/side was marked, verified correct patient position, special equipment/implants available, Radiology Safety Procedures followed,  medications/allergies/relevent history reviewed, required imaging and test results available.  Performed  The right femoral artery was easily canulated using a modified Seldinger technique.  Hemodynamics:   RA: 12/11/9 RV: 38/11 PCWP: 25/24/20 ( prominent V waves suggestive of TR) PA:  42/21/30  Cardiac Output   Thermodilution: 2.5 lpm, index of 1.2  Fick : 3.7 lpm, index 1.8  Arterial Sat: 92 PA Sat: 57.  LV pressure: 103/18 Aortic pressure: 101/81  Angiography :  It was very difficult to cannulate the left main. We ended up using a Judkins left 5 catheter. It is very difficult to maintain catheter position.  We used 60 cc of contrast for angiography  Left Main: The left main has mild irregularities in the proximal and midsegment. There is a 70% stenosis in the distal left main.  This stenosis actually continues for and involves the origin of the left circumflex artery and left anterior descending artery.  Left anterior Descending: There's a 70% irregular stenosis in the origin of the left anterior descending artery. The stenosis actually involves the left main and origin of the left circumflex artery.  The LAD has moderate irregularities throughout its course. It gives off a large first diagonal artery that has mild regularities. The second diagonal artery has a 50-60% stenosis at its proximal segment.  There is a discrete 60-70% stenosis in the distal left anterior descending artery.  There is a large septal perforator branch that has a 70-80% stenosis at its origin. This septal perforator  provides most of the filling to the intraventricular septum.  Left Circumflex: The left complex artery is relatively small vessel. There is a 70% stenosis at its origin. There is a 80-90% stenosis in the distal circumflex artery just prior to giving off a small obtuse marginal branch.  Right Coronary Artery: Right coronary artery was also very difficult to engage. There is a hazy stenosis at the origin of the right coronary artery that is associated with a 20-30% stenosis at its origin. The right coronary artery is very dilated and ectatic. The posterior descending artery has a 40-50% stenosis at its origin.  LV Gram: No left ventriculogram was performed because of a creatinine of 2.0. We measured pressures in the left ventricle and then an LV/AO pullback. Complications: No apparent complications Patient did tolerate procedure well.  Conclusions:    1. Coronary artery disease: The patient has significant coronary artery disease involving the left main, left anterior descending artery, and left circumflex artery. These lesions are nonmovable 2 percutaneous coronary intervention and. We'll consult the CV surgeons.  2. Congestive heart failure:  The patient has a known ejection fraction of 20-25% by echo. His cardiac output is moderately to severely depressed by Fick and thermodilution calculation.    3. Chronic renal insufficiency: The patient's baseline creatinine is 2.0. We tried to use a minimal amount of contrast. We used 60 cc of contrast during the case. We had considerable difficulty keeping the cath was engaged in the coronary arteries.   [name], [name]., MD, [facility] 05/25/2011, 10:53 AM

## 2011-05-25 NOTE — H&P (View-Only) (Signed)
[name] Date of Birth: April 08, 1936 Medical Record #[id number]  History of Present Illness: Nikan is seen today for a work in visit. He is seen with Dr. Swaziland. He has atrial fibrillation that was noted back in May. Coumadin was started. He did not require rate control. He has had 2 episodes of syncope occuring at church. One was in July and the second was back in November. He has an event monitor in place. He has had extensive evaluation including blood work, echo and CT of the head. His echo at that time showed an EF of 50 to 55%. He had mild to moderate LAE and mild pulmonary HTN. BNP recently was elevated at 450. He has actually lost weight. He has been taken off of his Lasix. He then had a myoview which showed no ischemia but a marked reduction of his EF at 23%. He then had an echo with definity showing reduced EF as well. We are now referring on for left and right heart catheterization.   His wife notes that he tends to downplay his symptoms. She notes more DOE. He is fatigued. He sits most of the day. No swelling. Appetite is poor. Does not feel bloated. No cough/PND/orthopnea. No chest pain reported.   Current Outpatient Prescriptions on File Prior to Visit  Medication Sig Dispense Refill  . dutasteride (AVODART) 0.5 MG capsule Take 0.5 mg by mouth daily.       Marland Kitchen lisinopril (PRINIVIL,ZESTRIL) 20 MG tablet Take 20 mg by mouth daily.        Marland Kitchen warfarin (COUMADIN) 2 MG tablet Take 4-6 mg by mouth daily. Take 6mg  on mon and thurs, 4mg  all other days.         No Known Allergies  Past Medical History  Diagnosis Date  . Hypertension 12/2010    was initially orthostatic, but now was back to being hypotensive  . Atrial fibrillation     with a stable rate, on no medications  . Syncope     Near-syncope, etiology is deemed secondary to the excessive heat  . Chronic kidney disease 12/2010    with a creatinine on discharge of 1.84, was  2.1 on admission  . Benign prostatic hypertrophy    but stable    Past Surgical History  Procedure Date  . Circumcision 08/12/2002    because of phimosis    History  Smoking status  . Former Smoker -- 45 years  Smokeless tobacco  . Not on file    History  Alcohol Use No    Family History  Problem Relation Age of Onset  . Cancer Father   . Lung cancer Father   . Diabetes Father   . Hypertension Mother   . Hypertension Brother     Review of Systems: The review of systems is per the HPI.  All other systems were reviewed and are negative.  Physical Exam: BP 128/104  Pulse 94  Ht 6' (1.829 m)  Wt 178 lb (80.74 kg)  BMI 24.14 kg/m2 Patient is very pleasant and in no acute distress. He does look ill. Skin is warm and dry. Color is normal.  HEENT is unremarkable. Normocephalic/atraumatic. PERRL. Sclera are nonicteric. Neck is supple. No masses. No JVD. Lungs are clear. Cardiac exam shows an irregular rhythm. Abdomen is soft. Extremities are without edema. Gait and ROM are intact. No gross neurologic deficits noted.    Lab Results  Component Value Date   WBC 8.1 04/22/2011   HGB 14.5 04/22/2011  HCT 42.4 04/22/2011   PLT 188 04/22/2011   GLUCOSE 101* 04/22/2011   ALT 16 12/28/2010   AST 21 12/28/2010   NA 142 04/22/2011   K 3.7 04/22/2011   CL 101 04/22/2011   CREATININE 1.94* 04/22/2011   BUN 22 04/22/2011   CO2 30 04/22/2011   TSH 1.226 12/28/2010   INR 2.42* 04/22/2011   Dg Chest 2 View  04/22/2011  *RADIOLOGY REPORT*  Clinical Data: Syncope.  Shortness of breath.  Hypertension.  CHEST - 2 VIEW  Comparison: 02/28/2011  Findings: Mild thoracic spondylosis noted.  There is mild tortuosity of the thoracic aorta.  Borderline cardiomegaly noted.  No significant pulmonary abnormality is observed.  No pleural effusion noted.  IMPRESSION:  1.  Borderline cardiomegaly, without edema. 2.  Thoracic spondylosis. 3.  Aortic tortuosity.  Original Report Authenticated By: Dellia Cloud, M.D.   EKG today shows atrial  fib. Rate is 94. Tracing is reviewed with Dr. [place].   Assessment / Plan:

## 2011-05-25 NOTE — Progress Notes (Signed)
Bedrest begins @ 1050.

## 2011-05-25 NOTE — Brief Op Note (Signed)
    Cardiac Cath Note  Troy Gregory [id number] 08/19/1935  Procedure: Right and left Heart Cardiac Catheterization Note Indications: Congestive heart failure  Procedure Details Consent: Obtained Time Out: Verified patient identification, verified procedure, site/side was marked, verified correct patient position, special equipment/implants available, Radiology Safety Procedures followed,  medications/allergies/relevent history reviewed, required imaging and test results available.  Performed  The right femoral artery was easily canulated using a modified Seldinger technique.  Hemodynamics:   RA: 12/11/9 RV: 38/11 PCWP: 25/24/20 ( prominent V waves suggestive of TR) PA:  42/21/30  Cardiac Output   Thermodilution: 2.5 lpm, index of 1.2  Fick : 3.7 lpm, index 1.8  Arterial Sat: 92 PA Sat: 57.  LV pressure: 103/18 Aortic pressure: 101/81  Angiography :  It was very difficult to cannulate the left main. We ended up using a Judkins left 5 catheter. It is very difficult to maintain catheter position.  We used 60 cc of contrast for angiography  Left Main: The left main has mild irregularities in the proximal and midsegment. There is a 70% stenosis in the distal left main.  This stenosis actually continues for and involves the origin of the left circumflex artery and left anterior descending artery.  Left anterior Descending: There's a 70% irregular stenosis in the origin of the left anterior descending artery. The stenosis actually involves the left main and origin of the left circumflex artery.  The LAD has moderate irregularities throughout its course. It gives off a large first diagonal artery that has mild regularities. The second diagonal artery has a 50-60% stenosis at its proximal segment.  There is a discrete 60-70% stenosis in the distal left anterior descending artery.  There is a large septal perforator branch that has a 70-80% stenosis at its origin. This septal perforator  provides most of the filling to the intraventricular septum.  Left Circumflex: The left complex artery is relatively small vessel. There is a 70% stenosis at its origin. There is a 80-90% stenosis in the distal circumflex artery just prior to giving off a small obtuse marginal branch.  Right Coronary Artery: Right coronary artery was also very difficult to engage. There is a hazy stenosis at the origin of the right coronary artery that is associated with a 20-30% stenosis at its origin. The right coronary artery is very dilated and ectatic. The posterior descending artery has a 40-50% stenosis at its origin.  LV Gram: No left ventriculogram was performed because of a creatinine of 2.0. We measured pressures in the left ventricle and then an LV/AO pullback. Complications: No apparent complications Patient did tolerate procedure well.  Conclusions:    1. Coronary artery disease: The patient has significant coronary artery disease involving the left main, left anterior descending artery, and left circumflex artery. These lesions are nonmovable 2 percutaneous coronary intervention and. We'll consult the CV surgeons.  2. Congestive heart failure:  The patient has a known ejection fraction of 20-25% by echo. His cardiac output is moderately to severely depressed by Fick and thermodilution calculation.    3. Chronic renal insufficiency: The patient's baseline creatinine is 2.0. We tried to use a minimal amount of contrast. We used 60 cc of contrast during the case. We had considerable difficulty keeping the cath was engaged in the coronary arteries.   Troy Gregory J. Troy Gregory, Jr., MD, FACC 05/25/2011, 10:53 AM   

## 2011-05-25 NOTE — Interval H&P Note (Signed)
History and Physical Interval Note:  05/25/2011 9:31 AM  Troy Gregory  has presented today for surgery, with the diagnosis of Chest pain  The various methods of treatment have been discussed with the patient and family. After consideration of risks, benefits and other options for treatment, the patient has consented to  Procedure(s): JV LEFT HEART CATHETERIZATION WITH CORONARY ANGIOGRAM as a surgical intervention .  The patients' history has been reviewed, patient examined, no change in status, stable for surgery.  I have reviewed the patients' chart and labs.  Questions were answered to the patient's satisfaction.    INR is 1.4.  Discussed case with family.  Will proceed with R and L heart cath.  Risks, benefits, options discussed.    [name].

## 2011-05-25 NOTE — Brief Op Note (Signed)
    Cardiac Cath Note  Troy Gregory [id number] 07/15/1935  Procedure: Right and left Heart Cardiac Catheterization Note Indications: Congestive heart failure  Procedure Details Consent: Obtained Time Out: Verified patient identification, verified procedure, site/side was marked, verified correct patient position, special equipment/implants available, Radiology Safety Procedures followed,  medications/allergies/relevent history reviewed, required imaging and test results available.  Performed  The right femoral artery was easily canulated using a modified Seldinger technique.  Hemodynamics:   RA: 12/11/9 RV: 38/11 PCWP: 25/24/20 ( prominent V waves suggestive of TR) PA:  42/21/30  Cardiac Output   Thermodilution: 2.5 lpm, index of 1.2  Fick : 3.7 lpm, index 1.8  Arterial Sat: 92 PA Sat: 57.  LV pressure: 103/18 Aortic pressure: 101/81  Angiography :  It was very difficult to cannulate the left main. We ended up using a Judkins left 5 catheter. It is very difficult to maintain catheter position.  We used 60 cc of contrast for angiography  Left Main: The left main has mild irregularities in the proximal and midsegment. There is a 70% stenosis in the distal left main.  This stenosis actually continues for and involves the origin of the left circumflex artery and left anterior descending artery.  Left anterior Descending: There's a 70% irregular stenosis in the origin of the left anterior descending artery. The stenosis actually involves the left main and origin of the left circumflex artery.  The LAD has moderate irregularities throughout its course. It gives off a large first diagonal artery that has mild regularities. The second diagonal artery has a 50-60% stenosis at its proximal segment.  There is a discrete 60-70% stenosis in the distal left anterior descending artery.  There is a large septal perforator branch that has a 70-80% stenosis at its origin. This septal perforator  provides most of the filling to the intraventricular septum.  Left Circumflex: The left complex artery is relatively small vessel. There is a 70% stenosis at its origin. There is a 80-90% stenosis in the distal circumflex artery just prior to giving off a small obtuse marginal branch.  Right Coronary Artery: Right coronary artery was also very difficult to engage. There is a hazy stenosis at the origin of the right coronary artery that is associated with a 20-30% stenosis at its origin. The right coronary artery is very dilated and ectatic. The posterior descending artery has a 40-50% stenosis at its origin.  LV Gram: No left ventriculogram was performed because of a creatinine of 2.0. We measured pressures in the left ventricle and then an LV/AO pullback. Complications: No apparent complications Patient did tolerate procedure well.  Conclusions:    1. Coronary artery disease: The patient has significant coronary artery disease involving the left main, left anterior descending artery, and left circumflex artery. These lesions are nonmovable 2 percutaneous coronary intervention and. We'll consult the CV surgeons.  2. Congestive heart failure:  The patient has a known ejection fraction of 20-25% by echo. His cardiac output is moderately to severely depressed by Fick and thermodilution calculation.    3. Chronic renal insufficiency: The patient's baseline creatinine is 2.0. We tried to use a minimal amount of contrast. We used 60 cc of contrast during the case. We had considerable difficulty keeping the cath was engaged in the coronary arteries.   [name], [name]., MD, [facility] 05/25/2011, 10:40 AM

## 2011-05-30 ENCOUNTER — Telehealth: Payer: Self-pay | Admitting: Cardiology

## 2011-05-30 MED ORDER — NITROGLYCERIN 0.4 MG SL SUBL: 0.4000 mg | SUBLINGUAL_TABLET | SUBLINGUAL | Status: DC | PRN

## 2011-05-30 NOTE — Telephone Encounter (Signed)
Wife calling wanting to know if appointment had been made w/surgeons. Spoke w/Dr. Elease Hashimoto who did his cath on Fri 12/21. He states he did call consult into [facility] on Friday but would call them again today. Advised wife that surgeons office should be getting in touch with them. Dr. Elease Hashimoto wanted to be sure he had some NTG. Wife states he did not have any NTG so sent Rx into [facility]. Also LM w/Dee Talley,RN at [facility] with wife's work number.

## 2011-05-30 NOTE — Telephone Encounter (Signed)
Fu call pts wife is calling back. She hasnt heard anything

## 2011-05-30 NOTE — Telephone Encounter (Signed)
New message:  Pt had cath on Friday and was told he would need by pass surgery.  Went home but has had a rough week end with shortness of breath.  Please call and refer to a surgeon as soon as possible.  Please call wife back and advise when this is done.

## 2011-06-01 ENCOUNTER — Encounter (HOSPITAL_COMMUNITY): Payer: Self-pay | Admitting: Pharmacy Technician

## 2011-06-01 ENCOUNTER — Institutional Professional Consult (permissible substitution) (INDEPENDENT_AMBULATORY_CARE_PROVIDER_SITE_OTHER): Payer: Medicare Other | Admitting: Cardiothoracic Surgery

## 2011-06-01 ENCOUNTER — Other Ambulatory Visit: Payer: Self-pay

## 2011-06-01 ENCOUNTER — Encounter: Payer: Self-pay | Admitting: Cardiothoracic Surgery

## 2011-06-01 VITALS — BP 112/84 | HR 52 | Resp 18 | Ht 72.0 in | Wt 177.0 lb

## 2011-06-01 DIAGNOSIS — I2510 Atherosclerotic heart disease of native coronary artery without angina pectoris: Secondary | ICD-10-CM

## 2011-06-01 DIAGNOSIS — I4891 Unspecified atrial fibrillation: Secondary | ICD-10-CM

## 2011-06-01 DIAGNOSIS — I059 Rheumatic mitral valve disease, unspecified: Secondary | ICD-10-CM

## 2011-06-01 NOTE — Progress Notes (Signed)
[address]            [name] [address]          [phone]      Troy Gregory [facility] Medical Record #[id number] Date of Birth: 1936-04-24  Referring: Nahser, Tiajuana Amass., MD Primary Cardiology:Dr. Peter Swaziland, MD Primary Care: Kaleen Mask, MD  Chief Complaint:    Chief Complaint  Patient presents with  . Coronary Artery Disease    Referral from Dr Elease Hashimoto for eval CABG, cath'ed 05/22/11    History of Present Illness:    Patient  is 75 yo with no known history of CAD presents with 100 lb weight lass over two years, onset of atrial fib in may 2012, and two documented syncopal episodes in July and Nov of this  Year. He has had over past several months progressive SOB with an with out exertion, PND frequently, 2 pillow ortho[pnea. He denies lower extremity edema or chest pain. He has no history of prior MI    Current Activity/ Functional Status: Patient is  independent with mobility/ambulation, transfers, ADL's, IADL's.    Past Medical History  Diagnosis Date  . Hypertension 12/2010    was initially orthostatic, but now was back to being hypotensive  . Campath-induced atrial fibrillation May 2012    with a stable rate, on no medications  . Syncope Two episodes July and Nov episode in Nov precipitated Cardiology evaluation      . Benign prostatic hypertrophy Elevated PSA to 13 by patient history bx done "several" years ago    but stable  . Chronic anticoagulation   . Coronary artery disease New DX since cath   . Mitral regurgitation   . Shortness of breath   . Chronic kidney disease 12/2010    with a creatinine on discharge of 1.84, was  2.1 on admission  . Calculus of kidney   . H/O hiatal hernia     Past Surgical History  Procedure Date  . Circumcision 08/12/2002    because of phimosis   Prostate BX   Family History  Problem Relation Age of Onset       . Lung cancer Father 62   . Diabetes Father   .  Hypertension- died in sleep age 15 on coumadin for AFIB Mother   . Hypertension Brother        Daughter with lupus      History   Social History  . Marital Status: Married         Number of Children: 2  . Years of Education: N/A   Occupational History  . 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 Smoking status: Former Smoker --quit age 53  .    Marland Kitchen Alcohol Use: No  . Drug Use: No   Other Topics Concern  . Not on file   Social History Narrative  . No narrative on file      No Known Allergies  Current Outpatient Prescriptions  Medication Sig Dispense Refill  . dutasteride (AVODART) 0.5 MG capsule Take 0.5 mg by mouth daily.       . furosemide (LASIX) 80 MG tablet Take 40 mg by mouth every morning.       Marland Kitchen lisinopril (PRINIVIL,ZESTRIL) 20 MG tablet Take 20 mg by mouth every morning.       Marland Kitchen  warfarin (COUMADIN) 2 MG tablet Take 4-6 mg by mouth every evening. Take 4mg  daily, then on day 5 take 6mg .      . atorvastatin (LIPITOR) 40 MG tablet Take 40 mg by mouth every morning.        . nitroGLYCERIN (NITROSTAT) 0.4 MG SL tablet Place 0.4 mg under the tongue every 5 (five) minutes as needed. FOR CHEST PAIN         Review of Systems  Constitutional: Positive for weight loss and malaise/fatigue. Negative for fever, chills and diaphoresis.  Eyes: Negative.   Respiratory: Positive for cough and shortness of breath. Negative for wheezing.   Cardiovascular: Positive for palpitations, orthopnea and PND. Negative for chest pain and leg swelling.  Gastrointestinal: Negative for heartburn, nausea, abdominal pain, diarrhea, constipation, blood in stool and melena.  Genitourinary: Negative.   Musculoskeletal: Positive for joint pain.  Skin: Negative for itching and rash.  Neurological: Positive for dizziness, loss of consciousness and weakness. Negative for focal weakness and seizures.  Psychiatric/Behavioral: Negative.   Regular dental  care No history of colonoscopy  Physical Exam: BP 112/84  Pulse 52  Resp 18  Ht 6' (1.829 m)  Wt 177 lb (80.287 kg)  BMI 24.01 kg/m2  SpO2 95%  General appearance: alert, cooperative, appears older than stated age, cachectic and no distress Neurologic: intact no carotid bruits Heart: irregularly irregular rhythm and no rub Lungs: clear to auscultation bilaterally Abdomen: soft, non-tender; bowel sounds normal; no masses,  no organomegaly Extremities: extremities normal, atraumatic, no cyanosis or edema, Homans sign is negative, no sign of DVT, no edema, redness or tenderness in the calves or thighs and no ulcers, gangrene or trophic changes Palpable distal pulses in feet bilateral No palpable AAA  Diagnostic Studies & Laboratory data:     Recent Radiology Findings:   Dg Chest 2 View  06/04/2011  *RADIOLOGY REPORT*  Clinical Data: Preop evaluation.  History of coronary artery disease.  History of mitral valvular prolapse.  For CABG and mitral valvular repair.  History of hypertension and atrial fibrillation. History of hiatal hernia.  CHEST - 2 VIEW  Comparison: 04/22/2011.  Findings: There is slight enlargement of the cardiac silhouette. Mediastinal and hilar contours appear stable.  No pulmonary edema, pneumonia, or pleural effusion is seen.  There is osteophyte formation in the spine.  IMPRESSION: Slight enlargement of the cardiac silhouette.  No pulmonary edema, pneumonia, or other acute abnormality is seen.  Original Report Authenticated By: Crawford Givens, M.D.      Recent Lab Findings: Lab Results  Component Value Date   WBC 7.0 06/04/2011   HGB 14.8 06/04/2011   HCT 44.0 06/04/2011   PLT 153 06/04/2011   GLUCOSE 180* 06/04/2011   ALT 27 06/04/2011   AST 28 06/04/2011   NA 140 06/04/2011   K 3.3* 06/04/2011   CL 102 06/04/2011   CREATININE 1.60* 06/04/2011   BUN 22 06/04/2011   CO2 24 06/04/2011   TSH 1.38 05/21/2011   INR 1.53* 06/04/2011   HGBA1C 6.1* 06/04/2011    Echo 05/16/11 Transthoracic Echocardiography  Patient: Gregory, Troy MR #: [id number] Study Date: 05/16/2011 Gender: M Age: 60 Height: 182.9cm Weight: 80.3kg BSA: 2.57m^2 Pt. Status: Room:  ATTENDING Peter Swaziland, MD [facility] Peter Swaziland, MD REFERRING Peter Swaziland, MD PERFORMING [facility] SONOGRAPHER Junious Dresser, RDCS cc:  ------------------------------------------------------------ LV EF: 20% - 25%  ------------------------------------------------------------ Indications: Atrial fibrillation - 427.31. Re-evaluate LV function.  ------------------------------------------------------------ History: PMH: Acquired from the patient  and from the patient's chart. Fatigue, syncope, and dyspnea. Atrial fibrillation. Left ventricular dysfunction, with an ejection fraction of 23%by radionuclide ventriculography. Mild mitral regurgitation. Renal disease. Risk factors: Hypertension.  ------------------------------------------------------------ Study Conclusions  - Left ventricle: The cavity size was normal. There was mild focal basal hypertrophy of the septum. Systolic function was severely reduced. The estimated ejection fraction was in the range of 20% to 25%. Diffuse hypokinesis. - Aortic valve: Trivial regurgitation. - Mitral valve: Moderate regurgitation. - Left atrium: The atrium was moderately dilated. - Right ventricle: The cavity size was mildly dilated. - Right atrium: The atrium was mildly dilated. - Pericardium, extracardiac: A trivial pericardial effusion was identified.  ------------------------------------------------------------ Labs, prior tests, procedures, and surgery: Echocardiography (July 2012). The mitral valve showed mild regurgitation. EF was 55%.  Stress nuclear imaging (December 2012). The study demonstrated marked LV dilation with severe global dysfunction. EF was 23%. Transthoracic echocardiography. M-mode, limited 2D,  limited spectral Doppler, and color Doppler. Height: Height: 182.9cm. Height: 72in. Weight: Weight: 80.3kg. Weight: 176.6lb. Body mass index: BMI: 24kg/m^2. Body surface area: BSA: 2.56m^2. Blood pressure: 138/94. Patient status: Outpatient. Location: [place] [facility]  ------------------------------------------------------------  ------------------------------------------------------------ Left ventricle: The cavity size was normal. There was mild focal basal hypertrophy of the septum. Systolic function was severely reduced. The estimated ejection fraction was in the range of 20% to 25%. Diffuse hypokinesis.  ------------------------------------------------------------ Aortic valve: Trileaflet; mildly thickened leaflets. Doppler: There was no stenosis. Trivial regurgitation.  ------------------------------------------------------------ Aorta: Aortic root: The aortic root was normal in size.  ------------------------------------------------------------ Mitral valve: Structurally normal valve. Leaflet separation was normal. Doppler: Transvalvular velocity was within the normal range. There was no evidence for stenosis. Moderate regurgitation.  ------------------------------------------------------------ Left atrium: The atrium was moderately dilated.  ------------------------------------------------------------ Right ventricle: The cavity size was mildly dilated. Systolic function was low normal.  ------------------------------------------------------------ Pulmonic valve: The valve appears to be grossly normal. Doppler: Mild regurgitation.  ------------------------------------------------------------ Tricuspid valve: Structurally normal valve. Leaflet separation was normal. Doppler: Transvalvular velocity was within the normal range. Mild regurgitation.  ------------------------------------------------------------ Right atrium: The atrium was mildly  dilated.  ------------------------------------------------------------ Pericardium: A trivial pericardial effusion was identified.  ------------------------------------------------------------ Systemic veins: Inferior vena cava: The vessel was normal in size; the respirophasic diameter changes were in the normal range (= 50%); findings are consistent with normal central venous pressure.  ------------------------------------------------------------  2D measurements Normal Left ventricle LVID ED, 51 mm 43-52 chord, PLAX LVID ES, 48 mm 23-38 chord, PLAX FS, chord, 6 % >29 PLAX LVPW, ED 9 mm ------ IVS/LVPW 1.44 <1.3 ratio, ED Ventricular septum IVS, ED 13 mm ------ LVOT Diam, S 21 mm ------ Area 3.46 cm^2 ------ Aorta Root diam, 37 mm ------ ED Left atrium AP dim 42 mm ------ AP dim 2.08 cm/m^2 <2.2 index  ------------------------------------------------------------ Prepared and Electronically Authenticated by  Olga Millers, MD, [facility] 2012-12-12T10:34:39.227  Cardiac Cath: Cardiac Cath Note  ALBERT HERSCH  [id number]  June 17, 1935  Procedure: Right and left Heart Cardiac Catheterization Note  Indications: Congestive heart failure  Procedure Details  Consent: Obtained  Time Out: Verified patient identification, verified procedure, site/side was marked, verified correct patient position, special equipment/implants available, Radiology Safety Procedures followed, medications/allergies/relevent history reviewed, required imaging and test results available. Performed  The right femoral artery was easily canulated using a modified Seldinger technique.  Hemodynamics:  RA: 12/11/9  RV: 38/11  PCWP: 25/24/20 ( prominent V waves suggestive of TR)  PA: 42/21/30  Cardiac Output  Thermodilution: 2.5 lpm, index of 1.2  Fick :  3.7 lpm, index 1.8  Arterial Sat: 92  PA Sat: 57.  LV pressure: 103/18  Aortic pressure: 101/81  Angiography : It was very difficult to  cannulate the left main. We ended up using a Judkins left 5 catheter. It is very difficult to maintain catheter position. We used 60 cc of contrast for angiography  Left Main: The left main has mild irregularities in the proximal and midsegment. There is a 70% stenosis in the distal left main. This stenosis actually continues for and involves the origin of the left circumflex artery and left anterior descending artery.  Left anterior Descending: There's a 70% irregular stenosis in the origin of the left anterior descending artery. The stenosis actually involves the left main and origin of the left circumflex artery. The LAD has moderate irregularities throughout its course. It gives off a large first diagonal artery that has mild regularities. The second diagonal artery has a 50-60% stenosis at its proximal segment. There is a discrete 60-70% stenosis in the distal left anterior descending artery.  There is a large septal perforator branch that has a 70-80% stenosis at its origin. This septal perforator provides most of the filling to the intraventricular septum.  Left Circumflex: The left complex artery is relatively small vessel. There is a 70% stenosis at its origin. There is a 80-90% stenosis in the distal circumflex artery just prior to giving off a small obtuse marginal branch.  Right Coronary Artery: Right coronary artery was also very difficult to engage. There is a hazy stenosis at the origin of the right coronary artery that is associated with a 20-30% stenosis at its origin. The right coronary artery is very dilated and ectatic. The posterior descending artery has a 40-50% stenosis at its origin.  LV Gram: No left ventriculogram was performed because of a creatinine of 2.0. We measured pressures in the left ventricle and then an LV/AO pullback.  Complications: No apparent complications  Patient did tolerate procedure well.  Conclusions:  1. Coronary artery disease: The patient has significant  coronary artery disease involving the left main, left anterior descending artery, and left circumflex artery. These lesions are nonmovable 2 percutaneous coronary intervention and. We'll consult the CV surgeons.  2. Congestive heart failure: The patient has a known ejection fraction of 20-25% by echo. His cardiac output is moderately to severely depressed by Fick and thermodilution calculation.  3. Chronic renal insufficiency: The patient's baseline creatinine is 2.0. We tried to use a minimal amount of contrast. We used 60 cc of contrast during the case. We had considerable difficulty keeping the cath was engaged in the coronary arteries.  [name], [name]., MD, [facility]  05/25/2011, 10:53 AM  Assessment / Plan:   Severe  ischemic cardiomyopathy with left main stenosis with failure symptoms but without anginia. Moderate MR A Fib of 8 month duration on coumadin Unexplained weight lass past 2 years  CABG with TEE , placement of atrial clip, poss MAZE  Has been recommended to the patient, If TEE shows significant MR  Mitral ring will be considered. With the degree of lv dysfunction, afib, wt loss with deceased albumin patient is at greater risk of surgical intervention.   The goals risks and alternatives of the planned surgical procedure CABG poss MAZE pass Mitral ring have been discussed with the patient in detail. The risks of the procedure including death, infection, stroke, myocardial infarction, bleeding, blood transfusion have all been discussed specifically.  I have quoted [name] a 10 %  of perioperative mortality and a complication rate as high as 40%. The patient's questions have been answered.ROLLYN SCIALDONE is willing  to proceed with the planned procedure.  [name] MD  Beeper [phone] Office [phone]        ROS

## 2011-06-01 NOTE — Patient Instructions (Addendum)
Stop Coumadin will check blood Monday then may need heparin shots Stop Lisinopril Monday  Coronary Artery Bypass Grafting Coronary artery bypass grafting (CABG) is done to bypass or fix arteries of the heart (coronary) that have become narrow or blocked. This is usually the result of plaque built up in the walls of the vessels. The coronary arteries supply the heart with the oxygen and nutrients it needs to pump blood to your body. The heart never rests and needs constant blood flow. If an artery is partially blocked, you may have chest pain (angina). Lack of blood flow to part of the heart muscle may cause that part to die. This is what happens in a heart attack (myocardial infarction). Reasons for CABG include:  Arteries that cannot be treated with medications or other interventions (such as a heart stent).   Severe angina not responsive to other treatment.   Improving heart function.   Treating a heart attack.  Every person is unique. Be sure you understand the risks and benefits of CABG.  RISKS AND COMPLICATIONS Your surgeon will discuss these with you. Your risks will be different depending on your past and present health and other factors. It may be helpful to have a family member or advocate with you so you feel free to ask questions and get the answers you need to give an informed consent. Possible problems of CABG surgery include:  Blood loss and replacement.   Stroke.   Infection.   Surgical site pain.   Heart attack during or after.   Kidney failure.  BEFORE THE PROCEDURE  Tell your doctor about any allergies, medicines, bleeding problems and other surgeries.   Take all medicines exactly as directed. You may start new medicines and stop taking others. Do not stop medications or adjust dosages on your own. Continue taking the medications up until the time of surgery.   Perform only activities that are suggested by your caregiver.   If you are overweight, you should  try to lose weight. Eat a heart-healthy diet that is low in fat and salt.   If you smoke, quit.   Let your caregiver know if you have been on steroids (including creams or drops) for long periods of time. This is critical.   If you are diabetic discuss with your doctor whether or not you should take insulin the day of your surgery.  DAY OF SURGERY:  Plan to arrive 60 minutes before the scheduled time or as directed.   Do not eat or drink anything, including medicine, unless directed.   You will sign a written consent. You may have blood work and other tests done.   Your family will be shown where they can wait for the surgeon to talk to them when the surgery is over.  PROCEDURE Only a specially trained surgeon does a CABG assisted by a team of other health care professionals. You will be asleep and not feel any discomfort during the surgery.   Traditional method:   A cut (incision) is made down the front of the chest through the breastbone (sternum).   The sternum is spread open so the surgeon can see your heart.   You are connected to a machine that does the work of your heart and lungs and your heart stops beating.   Veins are taken from your leg(s) and used to bypass the blocked arteries of your heart. Sometimes an artery from inside your chest wall is used, either by itself or along with leg  veins.   When the bypasses are done, you are taken off the machine, and your heart is restarted and takes over again.   Alternate methods:   The heart lung machine is not used. This is called off-pump. Your heart continues to beat while the bypasses are done.   Smaller incisions are used instead of going through the middle of the chest (minimally invasive).   Intended to reduce pain and promote a faster recovery.  AFTER THE PROCEDURE  You may wake up with a tube in your throat to help your breathing. You may be connected to a breathing machine.   You will not be able to talk while  the tube is in place. Try not to fight against it. The tube will be taken out as soon as it is safe.   Even if you cannot see them, there are nurses nearby who are watching everything. You are not alone.   Your family should be prepared to see you with many tubes and wires. You will be sleepy and pale. Your family can hold your hand and speak to you, but you may have no memory of this time.  SEEK IMMEDIATE MEDICAL CARE IF:  You have severe chest pain, especially if the pain is crushing or pressure-like and spreads to the arms, back, neck, or jaw, or if you have sweating, feel sick to your stomach (nausea), or shortness of breath. THIS IS AN EMERGENCY. Do not wait to see if the pain will go away. Get medical help at once. Call your local emergency services (911 in [place] DO NOT drive yourself to the hospital.   You have an attack of chest pain lasting longer than usual, despite rest and treatment with the medications your doctor has prescribed.   You wake from sleep with chest pain.   You feel dizzy or faint.  Document Released: 02/28/2005 Document Revised: 01/31/2011 Document Reviewed: 11/05/2007 [facility] Patient Information 2012 [place], [place].  Coronary Artery Bypass Grafting Care After Refer to this sheet in the next few weeks. These instructions provide you with information on caring for yourself after your procedure. Your caregiver may also give you more specific instructions. Your treatment has been planned according to current medical practices, but problems sometimes occur. Call your caregiver if you have any problems or questions after your procedure.  Recovery from open heart surgery will be different for everyone. Some people feel well after 3 or 4 weeks, while for others it takes longer. After heart surgery, it may be normal to:  Not have an appetite, feel nauseated by the smell of food, or only want to eat a small amount.   Be constipated because of changes in your diet,  activity, and medicines. Eat foods high in fiber. Add fresh fruits and vegetables to your diet. Stool softeners may be helpful.   Feel sad or unhappy. You may be frustrated or cranky. You may have good days and bad days. Do not give up. Talk to your caregiver if you do not feel better.   Feel weakness and fatigue. You many need physical therapy or cardiac rehabilitation to get your strength back.   Develop an irregular heartbeat called atrial fibrillation. Symptoms of atrial fibrillation are a fast, irregular heartbeat or feelings of fluttery heartbeats, shortness of breath, low blood pressure, and dizziness. If these symptoms develop, see your caregiver right away.  MEDICATION  Have a list of all the medicines you will be taking when you leave the hospital. For every medicine,  know the following:   Name.   Exact dose.   Time of day to be taken.   How often it should be taken.   Why you are taking it.   Ask which medicines should or should not be taken together. If you take more than one heart medicine, ask if it is okay to take them together. Some heart medicines should not be taken at the same time because they may lower your blood pressure too much.   Narcotic pain medicine can cause constipation. Eat fresh fruits and vegetables. Add fiber to your diet. Stool softener medicine may help relieve constipation.   Keep a copy of your medicines with you at all times.   Do not add or stop taking any medicine until you check with your caregiver.   Medicines can have side effects. Call your caregiver who prescribed the medicine if you:   Start throwing up, have diarrhea, or have stomach pain.   Feel dizzy or lightheaded when you stand up.   Feel your heart is skipping beats or is beating too fast or too slow.   Develop a rash.   Notice unusual bruising or bleeding.  HOME CARE INSTRUCTIONS  After heart surgery, it is important to learn how to take your pulse. Have your caregiver  show you how to take your pulse.   Use your incentive spirometer. Ask your caregiver how long after surgery you need to use it.  Care of your chest incision  Tell your caregiver right away if you notice clicking in your chest (sternum).   Support your chest with a pillow or your arms when you take deep breaths and cough.   Follow your caregiver's instructions about when you can bathe or swim.   Protect your incision from sunlight during the first year to keep the scar from getting dark.   Tell your caregiver if you notice:   Increased tenderness of your incision.   Increased redness or swelling around your incision.   Drainage or pus from your incision.  Care of your leg incision(s)  Avoid crossing your legs.   Avoid sitting for long periods of time. Change positions every half hour.   Elevate your leg(s) when you are sitting.   Check your leg(s) daily for swelling. Check the incisions for redness or drainage.   Wear your elastic stockings as told by your caregiver. Take them off at bedtime.  Diet  Diet is very important to heart health.   Eat plenty of fresh fruits and vegetables. Meats should be lean cut. Avoid canned, processed, and fried foods.   Talk to a dietician. They can teach you how to make healthy food and drink choices.  Weight  Weigh yourself every day. This is important because it helps to know if you are retaining fluid that may make your heart and lungs work harder.   Use the same scale each time.   Weigh yourself every morning at the same time. You should do this after you go to the bathroom, but before you eat breakfast.   Your weight will be more accurate if you do not wear any clothes.   Record your weight.   Tell your caregiver if you have gained 2 pounds or more overnight.  Activity Stop any activity at once if you have chest pain, shortness of breath, irregular heartbeats, or dizziness. Get help right away if you have any of these  symptoms.  Bathing.  Avoid soaking in a bath or hot tub  until your incisions are healed.   Rest. You need a balance of rest and activity.   Exercise. Exercise per your caregiver's advice. You may need physical therapy or cardiac rehabilitation to help strengthen your muscles and build your endurance.   Climbing stairs. Unless your caregiver tells you not to climb stairs, go up stairs slowly and rest if you tire. Do not pull yourself up by the handrail.   Driving a car. Follow your caregiver's advice on when you may drive. You may ride as a passenger at any time. When traveling for long periods of time in a car, get out of the car and walk around for a few minutes every 2 hours.   Lifting. Avoid lifting, pushing, or pulling anything heavier than 10 pounds for 6 weeks after surgery or as told by your caregiver.   Returning to work. Check with your caregiver. People heal at different rates. Most people will be able to go back to work 6 to 12 weeks after surgery.   Sexual activity. You may resume sexual relations as told by your caregiver.  SEEK MEDICAL CARE IF:  Any of your incisions are red, painful, or have any type of drainage coming from them.   You have an oral temperature above 102 F (38.9 C).   You have ankle or leg swelling.   You have pain in your legs.   You have weight gain of 2 or more pounds a day.   You feel dizzy or lightheaded when you stand up.  SEEK IMMEDIATE MEDICAL CARE IF:  You have angina or chest pain that goes to your jaw or arms. Call your local emergency services right away.   You have shortness of breath at rest or with activity.   You have a fast or irregular heartbeat (arrhythmia).   There is a "clicking" in your sternum when you move.   You have numbness or weakness in your arms or legs.  MAKE SURE YOU:  Understand these instructions.   Will watch your condition.   Will get help right away if you are not doing well or get worse.  Document  Released: 12/08/2004 Document Revised: 01/31/2011 Document Reviewed: 07/26/2010 [facility] Patient Information 2012 [place], [place].

## 2011-06-04 ENCOUNTER — Encounter (HOSPITAL_COMMUNITY)
Admission: RE | Admit: 2011-06-04 | Discharge: 2011-06-04 | Disposition: A | Discharge status: Home / Self Care | Payer: Medicare Other | Source: Ambulatory Visit | Attending: Cardiothoracic Surgery | Admitting: Cardiothoracic Surgery

## 2011-06-04 ENCOUNTER — Encounter (HOSPITAL_COMMUNITY): Payer: Self-pay

## 2011-06-04 ENCOUNTER — Ambulatory Visit (HOSPITAL_COMMUNITY)
Admission: RE | Admit: 2011-06-04 | Discharge: 2011-06-04 | Disposition: A | Discharge status: Home / Self Care | Payer: Medicare Other | Source: Ambulatory Visit | Attending: Cardiothoracic Surgery | Admitting: Cardiothoracic Surgery

## 2011-06-04 DIAGNOSIS — Z01810 Encounter for preprocedural cardiovascular examination: Secondary | ICD-10-CM

## 2011-06-04 DIAGNOSIS — Z01818 Encounter for other preprocedural examination: Secondary | ICD-10-CM | POA: Insufficient documentation

## 2011-06-04 DIAGNOSIS — I059 Rheumatic mitral valve disease, unspecified: Secondary | ICD-10-CM

## 2011-06-04 DIAGNOSIS — I2510 Atherosclerotic heart disease of native coronary artery without angina pectoris: Secondary | ICD-10-CM

## 2011-06-04 DIAGNOSIS — Z01812 Encounter for preprocedural laboratory examination: Secondary | ICD-10-CM | POA: Insufficient documentation

## 2011-06-04 DIAGNOSIS — I4891 Unspecified atrial fibrillation: Secondary | ICD-10-CM

## 2011-06-04 HISTORY — DX: Nonrheumatic mitral (valve) insufficiency: I34.0

## 2011-06-04 HISTORY — DX: Calculus of kidney: N20.0

## 2011-06-04 HISTORY — DX: Atherosclerotic heart disease of native coronary artery without angina pectoris: I25.10

## 2011-06-04 HISTORY — DX: Personal history of other diseases of the digestive system: Z87.19

## 2011-06-04 HISTORY — DX: Shortness of breath: R06.02

## 2011-06-04 LAB — CBC
HCT: 44 % (ref 39.0–52.0)
Hemoglobin: 14.8 g/dL (ref 13.0–17.0)
MCH: 30.3 pg (ref 26.0–34.0)
MCHC: 33.6 g/dL (ref 30.0–36.0)
MCV: 90.2 fL (ref 78.0–100.0)
Platelets: 153 10*3/uL (ref 150–400)
RBC: 4.88 MIL/uL (ref 4.22–5.81)
RDW: 12.9 % (ref 11.5–15.5)
WBC: 7 10*3/uL (ref 4.0–10.5)

## 2011-06-04 LAB — BLOOD GAS, ARTERIAL
Acid-Base Excess: 3 mmol/L — ABNORMAL HIGH (ref 0.0–2.0)
Bicarbonate: 26.9 mEq/L — ABNORMAL HIGH (ref 20.0–24.0)
Drawn by: 181601
FIO2: 0.21 %
O2 Saturation: 98.6 %
Patient temperature: 98.6
TCO2: 28.2 mmol/L (ref 0–100)
pCO2 arterial: 40.8 mmHg (ref 35.0–45.0)
pH, Arterial: 7.435 (ref 7.350–7.450)
pO2, Arterial: 106 mmHg — ABNORMAL HIGH (ref 80.0–100.0)

## 2011-06-04 LAB — COMPREHENSIVE METABOLIC PANEL
ALT: 27 U/L (ref 0–53)
AST: 28 U/L (ref 0–37)
Albumin: 3.2 g/dL — ABNORMAL LOW (ref 3.5–5.2)
Alkaline Phosphatase: 75 U/L (ref 39–117)
BUN: 22 mg/dL (ref 6–23)
CO2: 24 mEq/L (ref 19–32)
Calcium: 9.2 mg/dL (ref 8.4–10.5)
Chloride: 102 mEq/L (ref 96–112)
Creatinine, Ser: 1.6 mg/dL — ABNORMAL HIGH (ref 0.50–1.35)
GFR calc Af Amer: 48 mL/min — ABNORMAL LOW (ref >90)
GFR calc non Af Amer: 41 mL/min — ABNORMAL LOW (ref >90)
Glucose, Bld: 180 mg/dL — ABNORMAL HIGH (ref 70–99)
Potassium: 3.3 mEq/L — ABNORMAL LOW (ref 3.5–5.1)
Sodium: 140 mEq/L (ref 135–145)
Total Bilirubin: 0.5 mg/dL (ref 0.3–1.2)
Total Protein: 7.4 g/dL (ref 6.0–8.3)

## 2011-06-04 LAB — URINALYSIS, ROUTINE W REFLEX MICROSCOPIC
Bilirubin Urine: NEGATIVE
Glucose, UA: NEGATIVE mg/dL
Hgb urine dipstick: NEGATIVE
Ketones, ur: NEGATIVE mg/dL
Leukocytes, UA: NEGATIVE
Nitrite: NEGATIVE
Protein, ur: 30 mg/dL — AB
Specific Gravity, Urine: 1.016 (ref 1.005–1.030)
Urobilinogen, UA: 1 mg/dL (ref 0.0–1.0)
pH: 5.5 (ref 5.0–8.0)

## 2011-06-04 LAB — HEMOGLOBIN A1C
Hgb A1c MFr Bld: 6.1 % — ABNORMAL HIGH (ref <5.7)
Mean Plasma Glucose: 128 mg/dL — ABNORMAL HIGH (ref <117)

## 2011-06-04 LAB — TYPE AND SCREEN
ABO/RH(D): O POS
Antibody Screen: NEGATIVE

## 2011-06-04 LAB — URINE MICROSCOPIC-ADD ON

## 2011-06-04 LAB — PSA: PSA: 3.77 ng/mL (ref <=4.00)

## 2011-06-04 LAB — PROTIME-INR
INR: 1.53 — ABNORMAL HIGH (ref 0.00–1.49)
Prothrombin Time: 18.7 seconds — ABNORMAL HIGH (ref 11.6–15.2)

## 2011-06-04 LAB — APTT: aPTT: 26 seconds (ref 24–37)

## 2011-06-04 LAB — ABO/RH: ABO/RH(D): O POS

## 2011-06-04 LAB — CEA: CEA: <0.5 ng/mL (ref 0.0–5.0)

## 2011-06-04 MED ORDER — CHLORHEXIDINE GLUCONATE 4 % EX LIQD: 30.0000 mL | CUTANEOUS | Status: DC

## 2011-06-04 NOTE — Pre-Procedure Instructions (Signed)
22 Troy Gregory  06/04/2011   Your procedure is scheduled on: 06/06/11  Report to [facility] at 630 AM.  Call this number if you have problems the morning of surgery: [phone]   Remember:   Do not eat food:After Midnight.  May have clear liquids: up to 4 Hours before arrival.  Clear liquids include soda, tea, black coffee, apple or grape juice, broth.  Take these medicines the morning of surgery with A SIP OF WATER: avodart   Do not wear jewelry, make-up or nail polish.  Do not wear lotions, powders, or perfumes. You may wear deodorant.  Do not shave 48 hours prior to surgery.  Do not bring valuables to the hospital.  Contacts, dentures or bridgework may not be worn into surgery.  Leave suitcase in the car. After surgery it may be brought to your room.  For patients admitted to the hospital, checkout time is 11:00 AM the day of discharge.   Patients discharged the day of surgery will not be allowed to drive home.  Name and phone number of your driver: Ardine Eng [phone]  Special Instructions: CHG Shower Use Special Wash: 1/2 bottle night before surgery and 1/2 bottle morning of surgery.   Please read over the following fact sheets that you were given: Pain Booklet, Coughing and Deep Breathing, Blood Transfusion Information, Open Heart Packet, MRSA Information and Surgical Site Infection Prevention

## 2011-06-04 NOTE — Progress Notes (Addendum)
ekg in epic 05/21/11,echo 05/16/11 in epic

## 2011-06-04 NOTE — Progress Notes (Signed)
Pre CABG Dopplers completed at 09:55.  Preliminary report is carotid artery duplex and ABI is within normal limits bilaterally. Smiley Houseman 06/04/2011, 10:54 AM

## 2011-06-04 NOTE — Consult Note (Signed)
Anesthesia:  Patient is a 75 year old male scheduled for CABG, possible Maze/MV Repair on 06/06/11.  History includes HTN, CAD, afib, syncope, BPH, CKD, HH, and former smoker.   I was asked to review his preoperative labs showing an elevated Cr of 1.6.  This is actually improved from 05/21/11 when it was 2.0.  ABG and electrolytes also noted.  His PT and INR are elevated at 18.7 and 1.53, respectively.  His PTT is 26.  Since his INR is less than 1.6, then I will defer additional lab orders to Dr. Tyrone Sage.    CXR showed slight enlargement of the cardiac silhouette. No pulmonary edema, pneumonia, or other acute abnormality is seen.   His EKG from [facility] done on 05/21/11 showed afib.  ECHO on 12/12//12 showed: - Left ventricle: The cavity size was normal. There was mild focal basal hypertrophy of the septum. Systolic function was severely reduced. The estimated ejection fraction was in the range of 20% to 25%. Diffuse hypokinesis. - Aortic valve: Trivial regurgitation. - Mitral valve: Moderate regurgitation. - Left atrium: The atrium was moderately dilated. - Right ventricle: The cavity size was mildly dilated. - Right atrium: The atrium was mildly dilated. - Pericardium, extracardiac: A trivial pericardial effusion was identified.  Dr. Elease Hashimoto performed a cardiac cath (see Op Note under Notes tab) on 05/25/11.   Conclusions:  1. Coronary artery disease: The patient has significant coronary artery disease involving the left main, left anterior descending artery, and left circumflex artery. These lesions are nonmovable 2 percutaneous coronary intervention and. We'll consult the CV surgeons.  2. Congestive heart failure: The patient has a known ejection fraction of 20-25% by echo. His cardiac output is moderately to severely depressed by Fick and thermodilution calculation.  3. Chronic renal insufficiency: The patient's baseline creatinine is 2.0. We tried to use a minimal amount of contrast. We  used 60 cc of contrast during the case. We had considerable difficulty keeping the cath was engaged in the coronary arteries.   He will be evaluated by his assigned Anesthesiologist on the morning of surgery, but anticipate he can proceed as planned.

## 2011-06-05 DIAGNOSIS — I4891 Unspecified atrial fibrillation: Secondary | ICD-10-CM

## 2011-06-05 DIAGNOSIS — Z9889 Other specified postprocedural states: Secondary | ICD-10-CM | POA: Insufficient documentation

## 2011-06-05 DIAGNOSIS — Z951 Presence of aortocoronary bypass graft: Secondary | ICD-10-CM | POA: Insufficient documentation

## 2011-06-05 DIAGNOSIS — I2510 Atherosclerotic heart disease of native coronary artery without angina pectoris: Secondary | ICD-10-CM

## 2011-06-05 DIAGNOSIS — I340 Nonrheumatic mitral (valve) insufficiency: Secondary | ICD-10-CM

## 2011-06-05 HISTORY — DX: Atherosclerotic heart disease of native coronary artery without angina pectoris: I25.10

## 2011-06-05 HISTORY — DX: Nonrheumatic mitral (valve) insufficiency: I34.0

## 2011-06-05 HISTORY — DX: Unspecified atrial fibrillation: I48.91

## 2011-06-05 MED ORDER — SODIUM CHLORIDE 0.9 % IV SOLN
1250.0000 mg | INTRAVENOUS | Status: AC
  Administered 2011-06-06: 1250 mg via INTRAVENOUS
  Filled 2011-06-05: qty 1250

## 2011-06-05 MED ORDER — NITROGLYCERIN IN D5W 200-5 MCG/ML-% IV SOLN
2.0000 ug/min | INTRAVENOUS | Status: DC
  Filled 2011-06-05: qty 250

## 2011-06-05 MED ORDER — SODIUM CHLORIDE 0.9 % IV SOLN
INTRAVENOUS | Status: DC
  Filled 2011-06-05: qty 40

## 2011-06-05 MED ORDER — DEXTROSE 5 % IV SOLN
750.0000 mg | INTRAVENOUS | Status: DC
  Filled 2011-06-05: qty 750

## 2011-06-05 MED ORDER — PHENYLEPHRINE HCL 10 MG/ML IJ SOLN
30.0000 ug/min | INTRAVENOUS | Status: DC
  Filled 2011-06-05: qty 2

## 2011-06-05 MED ORDER — CEFUROXIME SODIUM 1.5 G IJ SOLR
1.5000 g | INTRAMUSCULAR | Status: AC
  Administered 2011-06-06: 1.5 g via INTRAVENOUS
  Filled 2011-06-05: qty 1.5

## 2011-06-05 MED ORDER — DOPAMINE-DEXTROSE 3.2-5 MG/ML-% IV SOLN
2.0000 ug/kg/min | INTRAVENOUS | Status: DC
  Filled 2011-06-05: qty 250

## 2011-06-05 MED ORDER — PLASMA-LYTE 148 IV SOLN
INTRAVENOUS | Status: AC
  Administered 2011-06-06: 10:00:00
  Filled 2011-06-05: qty 0.5

## 2011-06-05 MED ORDER — SODIUM CHLORIDE 0.9 % IV SOLN
INTRAVENOUS | Status: DC
  Filled 2011-06-05: qty 1

## 2011-06-05 MED ORDER — MAGNESIUM SULFATE 50 % IJ SOLN
40.0000 meq | INTRAMUSCULAR | Status: DC
  Filled 2011-06-05: qty 10

## 2011-06-05 MED ORDER — SODIUM CHLORIDE 0.9 % IV SOLN
0.1000 ug/kg/h | INTRAVENOUS | Status: DC
  Filled 2011-06-05: qty 4

## 2011-06-05 MED ORDER — EPINEPHRINE HCL 1 MG/ML IJ SOLN
0.5000 ug/min | INTRAVENOUS | Status: DC
  Filled 2011-06-05: qty 4

## 2011-06-05 MED ORDER — POTASSIUM CHLORIDE 2 MEQ/ML IV SOLN
80.0000 meq | INTRAVENOUS | Status: DC
  Filled 2011-06-05: qty 40

## 2011-06-06 ENCOUNTER — Encounter (HOSPITAL_COMMUNITY): Payer: Self-pay | Admitting: Vascular Surgery

## 2011-06-06 ENCOUNTER — Inpatient Hospital Stay (HOSPITAL_COMMUNITY): Payer: Medicare Other

## 2011-06-06 ENCOUNTER — Other Ambulatory Visit: Payer: Self-pay

## 2011-06-06 ENCOUNTER — Inpatient Hospital Stay (HOSPITAL_COMMUNITY)
Admission: RE | Admit: 2011-06-06 | Discharge: 2011-06-19 | DRG: 219 | Disposition: A | Discharge status: Skilled Nursing Facility | Payer: Medicare Other | Source: Ambulatory Visit | Attending: Cardiothoracic Surgery | Admitting: Cardiothoracic Surgery

## 2011-06-06 ENCOUNTER — Encounter (HOSPITAL_COMMUNITY)
Admission: RE | Disposition: A | Discharge status: Skilled Nursing Facility | Payer: Self-pay | Source: Ambulatory Visit | Attending: Cardiothoracic Surgery

## 2011-06-06 ENCOUNTER — Encounter (HOSPITAL_COMMUNITY): Payer: Self-pay | Admitting: *Deleted

## 2011-06-06 ENCOUNTER — Ambulatory Visit (HOSPITAL_COMMUNITY): Payer: Medicare Other | Admitting: Vascular Surgery

## 2011-06-06 DIAGNOSIS — T50995A Adverse effect of other drugs, medicaments and biological substances, initial encounter: Secondary | ICD-10-CM | POA: Diagnosis not present

## 2011-06-06 DIAGNOSIS — Z8249 Family history of ischemic heart disease and other diseases of the circulatory system: Secondary | ICD-10-CM

## 2011-06-06 DIAGNOSIS — I4891 Unspecified atrial fibrillation: Secondary | ICD-10-CM

## 2011-06-06 DIAGNOSIS — Z7901 Long term (current) use of anticoagulants: Secondary | ICD-10-CM

## 2011-06-06 DIAGNOSIS — Z7982 Long term (current) use of aspirin: Secondary | ICD-10-CM

## 2011-06-06 DIAGNOSIS — Z79899 Other long term (current) drug therapy: Secondary | ICD-10-CM

## 2011-06-06 DIAGNOSIS — I2510 Atherosclerotic heart disease of native coronary artery without angina pectoris: Secondary | ICD-10-CM

## 2011-06-06 DIAGNOSIS — E8779 Other fluid overload: Secondary | ICD-10-CM | POA: Diagnosis not present

## 2011-06-06 DIAGNOSIS — N179 Acute kidney failure, unspecified: Secondary | ICD-10-CM | POA: Diagnosis not present

## 2011-06-06 DIAGNOSIS — N138 Other obstructive and reflux uropathy: Secondary | ICD-10-CM | POA: Diagnosis present

## 2011-06-06 DIAGNOSIS — R310 Gross hematuria: Secondary | ICD-10-CM | POA: Diagnosis not present

## 2011-06-06 DIAGNOSIS — Y9210 Unspecified residential institution as the place of occurrence of the external cause: Secondary | ICD-10-CM | POA: Diagnosis not present

## 2011-06-06 DIAGNOSIS — R339 Retention of urine, unspecified: Secondary | ICD-10-CM | POA: Diagnosis not present

## 2011-06-06 DIAGNOSIS — I059 Rheumatic mitral valve disease, unspecified: Secondary | ICD-10-CM

## 2011-06-06 DIAGNOSIS — Z87891 Personal history of nicotine dependence: Secondary | ICD-10-CM

## 2011-06-06 DIAGNOSIS — N401 Enlarged prostate with lower urinary tract symptoms: Secondary | ICD-10-CM | POA: Diagnosis present

## 2011-06-06 DIAGNOSIS — S3730XA Unspecified injury of urethra, initial encounter: Secondary | ICD-10-CM | POA: Diagnosis not present

## 2011-06-06 DIAGNOSIS — S3720XA Unspecified injury of bladder, initial encounter: Secondary | ICD-10-CM | POA: Diagnosis not present

## 2011-06-06 DIAGNOSIS — Z833 Family history of diabetes mellitus: Secondary | ICD-10-CM

## 2011-06-06 DIAGNOSIS — D62 Acute posthemorrhagic anemia: Secondary | ICD-10-CM | POA: Diagnosis not present

## 2011-06-06 DIAGNOSIS — I129 Hypertensive chronic kidney disease with stage 1 through stage 4 chronic kidney disease, or unspecified chronic kidney disease: Secondary | ICD-10-CM | POA: Diagnosis present

## 2011-06-06 DIAGNOSIS — Z801 Family history of malignant neoplasm of trachea, bronchus and lung: Secondary | ICD-10-CM

## 2011-06-06 DIAGNOSIS — N189 Chronic kidney disease, unspecified: Secondary | ICD-10-CM | POA: Diagnosis present

## 2011-06-06 DIAGNOSIS — X58XXXA Exposure to other specified factors, initial encounter: Secondary | ICD-10-CM | POA: Diagnosis not present

## 2011-06-06 DIAGNOSIS — I5022 Chronic systolic (congestive) heart failure: Secondary | ICD-10-CM | POA: Diagnosis present

## 2011-06-06 DIAGNOSIS — G929 Unspecified toxic encephalopathy: Secondary | ICD-10-CM | POA: Diagnosis not present

## 2011-06-06 DIAGNOSIS — G92 Toxic encephalopathy: Secondary | ICD-10-CM | POA: Diagnosis not present

## 2011-06-06 DIAGNOSIS — D696 Thrombocytopenia, unspecified: Secondary | ICD-10-CM | POA: Diagnosis not present

## 2011-06-06 DIAGNOSIS — R5381 Other malaise: Secondary | ICD-10-CM | POA: Diagnosis not present

## 2011-06-06 DIAGNOSIS — I2589 Other forms of chronic ischemic heart disease: Secondary | ICD-10-CM | POA: Diagnosis present

## 2011-06-06 HISTORY — PX: MAZE: SHX5063

## 2011-06-06 HISTORY — PX: CORONARY ARTERY BYPASS GRAFT: SHX141

## 2011-06-06 HISTORY — PX: MITRAL VALVE REPAIR: SHX2039

## 2011-06-06 LAB — POCT I-STAT 4, (NA,K, GLUC, HGB,HCT)
Glucose, Bld: 120 mg/dL — ABNORMAL HIGH (ref 70–99)
Glucose, Bld: 134 mg/dL — ABNORMAL HIGH (ref 70–99)
Glucose, Bld: 114 mg/dL — ABNORMAL HIGH (ref 70–99)
Glucose, Bld: 169 mg/dL — ABNORMAL HIGH (ref 70–99)
Glucose, Bld: 127 mg/dL — ABNORMAL HIGH (ref 70–99)
Glucose, Bld: 82 mg/dL (ref 70–99)
HCT: 39 % (ref 39.0–52.0)
HCT: 38 % — ABNORMAL LOW (ref 39.0–52.0)
HCT: 26 % — ABNORMAL LOW (ref 39.0–52.0)
HCT: 30 % — ABNORMAL LOW (ref 39.0–52.0)
HCT: 26 % — ABNORMAL LOW (ref 39.0–52.0)
HCT: 34 % — ABNORMAL LOW (ref 39.0–52.0)
Hemoglobin: 13.3 g/dL (ref 13.0–17.0)
Hemoglobin: 12.9 g/dL — ABNORMAL LOW (ref 13.0–17.0)
Hemoglobin: 8.8 g/dL — ABNORMAL LOW (ref 13.0–17.0)
Hemoglobin: 10.2 g/dL — ABNORMAL LOW (ref 13.0–17.0)
Hemoglobin: 8.8 g/dL — ABNORMAL LOW (ref 13.0–17.0)
Hemoglobin: 11.6 g/dL — ABNORMAL LOW (ref 13.0–17.0)
Potassium: 3.7 mEq/L (ref 3.5–5.1)
Potassium: 3.8 mEq/L (ref 3.5–5.1)
Potassium: 3.6 mEq/L (ref 3.5–5.1)
Potassium: 4 mEq/L (ref 3.5–5.1)
Potassium: 3.6 mEq/L (ref 3.5–5.1)
Potassium: 3.4 mEq/L — ABNORMAL LOW (ref 3.5–5.1)
Sodium: 142 mEq/L (ref 135–145)
Sodium: 141 mEq/L (ref 135–145)
Sodium: 139 mEq/L (ref 135–145)
Sodium: 142 mEq/L (ref 135–145)
Sodium: 139 mEq/L (ref 135–145)
Sodium: 142 mEq/L (ref 135–145)

## 2011-06-06 LAB — POCT I-STAT GLUCOSE
Glucose, Bld: 150 mg/dL — ABNORMAL HIGH (ref 70–99)
Glucose, Bld: 179 mg/dL — ABNORMAL HIGH (ref 70–99)
Operator id: 3406
Operator id: 3406

## 2011-06-06 LAB — POCT I-STAT 3, ART BLOOD GAS (G3+)
Acid-Base Excess: 2 mmol/L (ref 0.0–2.0)
Acid-base deficit: 2 mmol/L (ref 0.0–2.0)
Bicarbonate: 25.8 mEq/L — ABNORMAL HIGH (ref 20.0–24.0)
Bicarbonate: 20.4 mEq/L (ref 20.0–24.0)
O2 Saturation: 100 %
O2 Saturation: 92 %
Patient temperature: 34.6
TCO2: 27 mmol/L (ref 0–100)
TCO2: 21 mmol/L (ref 0–100)
pCO2 arterial: 35 mmHg (ref 35.0–45.0)
pCO2 arterial: 24.5 mmHg — ABNORMAL LOW (ref 35.0–45.0)
pH, Arterial: 7.476 — ABNORMAL HIGH (ref 7.350–7.450)
pH, Arterial: 7.518 — ABNORMAL HIGH (ref 7.350–7.450)
pO2, Arterial: 287 mmHg — ABNORMAL HIGH (ref 80.0–100.0)
pO2, Arterial: 50 mmHg — ABNORMAL LOW (ref 80.0–100.0)

## 2011-06-06 LAB — CBC
HCT: 35.3 % — ABNORMAL LOW (ref 39.0–52.0)
HCT: 31 % — ABNORMAL LOW (ref 39.0–52.0)
Hemoglobin: 12 g/dL — ABNORMAL LOW (ref 13.0–17.0)
Hemoglobin: 10.7 g/dL — ABNORMAL LOW (ref 13.0–17.0)
MCH: 30.2 pg (ref 26.0–34.0)
MCH: 30.8 pg (ref 26.0–34.0)
MCHC: 34 g/dL (ref 30.0–36.0)
MCHC: 34.5 g/dL (ref 30.0–36.0)
MCV: 88.9 fL (ref 78.0–100.0)
MCV: 89.3 fL (ref 78.0–100.0)
Platelets: 76 10*3/uL — ABNORMAL LOW (ref 150–400)
Platelets: 85 10*3/uL — ABNORMAL LOW (ref 150–400)
RBC: 3.97 MIL/uL — ABNORMAL LOW (ref 4.22–5.81)
RBC: 3.47 MIL/uL — ABNORMAL LOW (ref 4.22–5.81)
RDW: 12.9 % (ref 11.5–15.5)
RDW: 13.2 % (ref 11.5–15.5)
WBC: 12.1 10*3/uL — ABNORMAL HIGH (ref 4.0–10.5)
WBC: 14.1 10*3/uL — ABNORMAL HIGH (ref 4.0–10.5)

## 2011-06-06 LAB — POCT I-STAT, CHEM 8
BUN: 18 mg/dL (ref 6–23)
Calcium, Ion: 1.21 mmol/L (ref 1.12–1.32)
Chloride: 111 mEq/L (ref 96–112)
Creatinine, Ser: 1.3 mg/dL (ref 0.50–1.35)
Glucose, Bld: 123 mg/dL — ABNORMAL HIGH (ref 70–99)
HCT: 30 % — ABNORMAL LOW (ref 39.0–52.0)
Hemoglobin: 10.2 g/dL — ABNORMAL LOW (ref 13.0–17.0)
Potassium: 4.4 mEq/L (ref 3.5–5.1)
Sodium: 142 mEq/L (ref 135–145)
TCO2: 22 mmol/L (ref 0–100)

## 2011-06-06 LAB — PROTIME-INR
INR: 1.73 — ABNORMAL HIGH (ref 0.00–1.49)
Prothrombin Time: 20.6 seconds — ABNORMAL HIGH (ref 11.6–15.2)

## 2011-06-06 LAB — APTT: aPTT: 33 seconds (ref 24–37)

## 2011-06-06 LAB — MAGNESIUM: Magnesium: 2.8 mg/dL — ABNORMAL HIGH (ref 1.5–2.5)

## 2011-06-06 LAB — CREATININE, SERUM
Creatinine, Ser: 1.33 mg/dL (ref 0.50–1.35)
GFR calc Af Amer: 60 mL/min — ABNORMAL LOW (ref >90)
GFR calc non Af Amer: 51 mL/min — ABNORMAL LOW (ref >90)

## 2011-06-06 LAB — HEMOGLOBIN AND HEMATOCRIT, BLOOD
HCT: 29.9 % — ABNORMAL LOW (ref 39.0–52.0)
Hemoglobin: 10.2 g/dL — ABNORMAL LOW (ref 13.0–17.0)

## 2011-06-06 LAB — PLATELET COUNT: Platelets: 100 10*3/uL — ABNORMAL LOW (ref 150–400)

## 2011-06-06 SURGERY — CORONARY ARTERY BYPASS GRAFTING (CABG)
Anesthesia: General | Site: Chest | Wound class: Clean

## 2011-06-06 MED ORDER — METOPROLOL TARTRATE 12.5 MG HALF TABLET
12.5000 mg | ORAL_TABLET | Freq: Two times a day (BID) | ORAL | Status: DC
  Administered 2011-06-07 – 2011-06-09 (×3): 12.5 mg via ORAL
  Filled 2011-06-06 (×7): qty 1

## 2011-06-06 MED ORDER — HEPARIN SODIUM (PORCINE) 1000 UNIT/ML IJ SOLN
INTRAMUSCULAR | Status: DC | PRN
  Administered 2011-06-06: 30000 [IU] via INTRAVENOUS

## 2011-06-06 MED ORDER — NITROGLYCERIN IN D5W 200-5 MCG/ML-% IV SOLN
INTRAVENOUS | Status: DC | PRN
  Administered 2011-06-06: 16 ug/min via INTRAVENOUS

## 2011-06-06 MED ORDER — SODIUM CHLORIDE 0.9 % IV SOLN
0.4000 ug/kg/h | INTRAVENOUS | Status: DC
  Administered 2011-06-06: 0.4 ug/kg/h via INTRAVENOUS
  Filled 2011-06-06: qty 4

## 2011-06-06 MED ORDER — LACTATED RINGERS IV SOLN
INTRAVENOUS | Status: DC
  Administered 2011-06-09: 20 mL/h via INTRAVENOUS

## 2011-06-06 MED ORDER — ALBUMIN HUMAN 25 % IV SOLN
INTRAVENOUS | Status: DC | PRN
  Administered 2011-06-06 (×2): via INTRAVENOUS

## 2011-06-06 MED ORDER — SODIUM CHLORIDE 0.9 % IJ SOLN
OROMUCOSAL | Status: DC | PRN
  Administered 2011-06-06 (×3): via TOPICAL

## 2011-06-06 MED ORDER — INSULIN ASPART 100 UNIT/ML ~~LOC~~ SOLN
0.0000 [IU] | SUBCUTANEOUS | Status: DC
  Administered 2011-06-07: 2 [IU] via SUBCUTANEOUS
  Administered 2011-06-07: 4 [IU] via SUBCUTANEOUS
  Administered 2011-06-07: 2 [IU] via SUBCUTANEOUS
  Administered 2011-06-07: 8 [IU] via SUBCUTANEOUS
  Administered 2011-06-07: 4 [IU] via SUBCUTANEOUS
  Administered 2011-06-08 (×2): 2 [IU] via SUBCUTANEOUS

## 2011-06-06 MED ORDER — METOPROLOL TARTRATE 25 MG/10 ML ORAL SUSPENSION
12.5000 mg | Freq: Two times a day (BID) | ORAL | Status: DC
  Administered 2011-06-08 (×2): 12.5 mg
  Filled 2011-06-06 (×7): qty 5

## 2011-06-06 MED ORDER — ASPIRIN EC 325 MG PO TBEC
325.0000 mg | DELAYED_RELEASE_TABLET | Freq: Every day | ORAL | Status: DC
  Administered 2011-06-07 – 2011-06-08 (×2): 325 mg via ORAL
  Filled 2011-06-06 (×3): qty 1

## 2011-06-06 MED ORDER — ACETAMINOPHEN 160 MG/5ML PO SOLN: 650.0000 mg | ORAL | Status: AC

## 2011-06-06 MED ORDER — LACTATED RINGERS IV SOLN
INTRAVENOUS | Status: DC | PRN
  Administered 2011-06-06 (×2): via INTRAVENOUS

## 2011-06-06 MED ORDER — DUTASTERIDE 0.5 MG PO CAPS
0.5000 mg | ORAL_CAPSULE | Freq: Every day | ORAL | Status: DC
  Administered 2011-06-07 – 2011-06-11 (×4): 0.5 mg via ORAL
  Filled 2011-06-06 (×7): qty 1

## 2011-06-06 MED ORDER — 0.9 % SODIUM CHLORIDE (POUR BTL) OPTIME
TOPICAL | Status: DC | PRN
  Administered 2011-06-06: 5000 mL

## 2011-06-06 MED ORDER — ASPIRIN 81 MG PO CHEW
324.0000 mg | CHEWABLE_TABLET | Freq: Every day | ORAL | Status: DC
  Administered 2011-06-09: 324 mg
  Filled 2011-06-06: qty 4

## 2011-06-06 MED ORDER — BISACODYL 5 MG PO TBEC
10.0000 mg | DELAYED_RELEASE_TABLET | Freq: Every day | ORAL | Status: DC
  Administered 2011-06-07 – 2011-06-11 (×4): 10 mg via ORAL
  Filled 2011-06-06 (×4): qty 2

## 2011-06-06 MED ORDER — FENTANYL CITRATE 0.05 MG/ML IJ SOLN
INTRAMUSCULAR | Status: DC | PRN
  Administered 2011-06-06: 250 ug via INTRAVENOUS
  Administered 2011-06-06: 50 ug via INTRAVENOUS
  Administered 2011-06-06 (×3): 250 ug via INTRAVENOUS

## 2011-06-06 MED ORDER — FAMOTIDINE IN NACL 20-0.9 MG/50ML-% IV SOLN
20.0000 mg | Freq: Two times a day (BID) | INTRAVENOUS | Status: AC
  Administered 2011-06-06 (×2): 20 mg via INTRAVENOUS
  Filled 2011-06-06: qty 50

## 2011-06-06 MED ORDER — MORPHINE SULFATE 2 MG/ML IJ SOLN: 1.0000 mg | INTRAMUSCULAR | Status: AC | PRN

## 2011-06-06 MED ORDER — PHENYLEPHRINE HCL 10 MG/ML IJ SOLN
0.0000 ug/min | INTRAVENOUS | Status: DC
  Filled 2011-06-06: qty 2

## 2011-06-06 MED ORDER — DOCUSATE SODIUM 100 MG PO CAPS
200.0000 mg | ORAL_CAPSULE | Freq: Every day | ORAL | Status: DC
  Administered 2011-06-07 – 2011-06-11 (×4): 200 mg via ORAL
  Filled 2011-06-06 (×4): qty 2

## 2011-06-06 MED ORDER — CEFUROXIME SODIUM 1.5 G IJ SOLR
750.0000 g | INTRAMUSCULAR | Status: DC | PRN
  Administered 2011-06-06: 750 g via INTRAVENOUS

## 2011-06-06 MED ORDER — MILRINONE IN DEXTROSE 200-5 MCG/ML-% IV SOLN
INTRAVENOUS | Status: DC | PRN
  Administered 2011-06-06: .3 ug/kg/min via INTRAVENOUS

## 2011-06-06 MED ORDER — MILRINONE IN DEXTROSE 200-5 MCG/ML-% IV SOLN
0.1250 ug/kg/min | INTRAVENOUS | Status: AC
  Administered 2011-06-06 – 2011-06-08 (×3): 0.3 ug/kg/min via INTRAVENOUS
  Filled 2011-06-06 (×3): qty 100

## 2011-06-06 MED ORDER — INSULIN ASPART 100 UNIT/ML ~~LOC~~ SOLN
0.0000 [IU] | SUBCUTANEOUS | Status: AC
  Administered 2011-06-07 (×2): 2 [IU] via SUBCUTANEOUS
  Filled 2011-06-06 (×2): qty 3

## 2011-06-06 MED ORDER — SODIUM CHLORIDE 0.9 % IV SOLN: INTRAVENOUS | Status: DC

## 2011-06-06 MED ORDER — MAGNESIUM SULFATE 40 MG/ML IJ SOLN
4.0000 g | Freq: Once | INTRAMUSCULAR | Status: AC
  Administered 2011-06-06: 4 g via INTRAVENOUS
  Filled 2011-06-06: qty 100

## 2011-06-06 MED ORDER — DOPAMINE-DEXTROSE 3.2-5 MG/ML-% IV SOLN
3.0000 ug/kg/min | INTRAVENOUS | Status: DC
  Administered 2011-06-08: 3 ug/kg/min via INTRAVENOUS
  Filled 2011-06-06: qty 250

## 2011-06-06 MED ORDER — SODIUM CHLORIDE 0.9 % IV SOLN
200.0000 ug | INTRAVENOUS | Status: DC | PRN
  Administered 2011-06-06: .2 ug/kg/h via INTRAVENOUS

## 2011-06-06 MED ORDER — ROCURONIUM BROMIDE 100 MG/10ML IV SOLN
INTRAVENOUS | Status: DC | PRN
  Administered 2011-06-06: 50 mg via INTRAVENOUS

## 2011-06-06 MED ORDER — PROTAMINE SULFATE 10 MG/ML IV SOLN
INTRAVENOUS | Status: DC | PRN
  Administered 2011-06-06: 280 mg via INTRAVENOUS

## 2011-06-06 MED ORDER — ACETAMINOPHEN 500 MG PO TABS
1000.0000 mg | ORAL_TABLET | Freq: Four times a day (QID) | ORAL | Status: AC
  Administered 2011-06-07 – 2011-06-08 (×5): 1000 mg via ORAL
  Administered 2011-06-09: 500 mg via ORAL
  Administered 2011-06-09 – 2011-06-11 (×3): 1000 mg via ORAL
  Filled 2011-06-06 (×19): qty 2

## 2011-06-06 MED ORDER — VECURONIUM BROMIDE 10 MG IV SOLR
INTRAVENOUS | Status: DC | PRN
  Administered 2011-06-06: 4 mg via INTRAVENOUS
  Administered 2011-06-06: 5 mg via INTRAVENOUS
  Administered 2011-06-06: 3 mg via INTRAVENOUS
  Administered 2011-06-06: 2 mg via INTRAVENOUS
  Administered 2011-06-06: 6 mg via INTRAVENOUS

## 2011-06-06 MED ORDER — HEMOSTATIC AGENTS (NO CHARGE) OPTIME
TOPICAL | Status: DC | PRN
  Administered 2011-06-06: 1 via TOPICAL

## 2011-06-06 MED ORDER — PANTOPRAZOLE SODIUM 40 MG PO TBEC
40.0000 mg | DELAYED_RELEASE_TABLET | Freq: Every day | ORAL | Status: DC
  Administered 2011-06-08 – 2011-06-11 (×4): 40 mg via ORAL
  Filled 2011-06-06 (×4): qty 1

## 2011-06-06 MED ORDER — AMIODARONE HCL 150 MG/3ML IV SOLN
30.0000 mg/h | INTRAVENOUS | Status: DC
  Administered 2011-06-06 – 2011-06-08 (×2): 30 mg/h via INTRAVENOUS
  Filled 2011-06-06 (×4): qty 9

## 2011-06-06 MED ORDER — SIMVASTATIN 20 MG PO TABS
20.0000 mg | ORAL_TABLET | Freq: Every day | ORAL | Status: DC
  Administered 2011-06-07 – 2011-06-11 (×4): 20 mg via ORAL
  Filled 2011-06-06 (×7): qty 1

## 2011-06-06 MED ORDER — ALBUMIN HUMAN 5 % IV SOLN
250.0000 mL | INTRAVENOUS | Status: AC | PRN
  Administered 2011-06-06 (×3): 250 mL via INTRAVENOUS
  Filled 2011-06-06 (×2): qty 250

## 2011-06-06 MED ORDER — LACTATED RINGERS IV SOLN
INTRAVENOUS | Status: DC | PRN
  Administered 2011-06-06 (×2): via INTRAVENOUS

## 2011-06-06 MED ORDER — PAPAVERINE HCL 30 MG/ML IJ SOLN
INTRAMUSCULAR | Status: DC | PRN
  Administered 2011-06-06: 60 mg via INTRAVENOUS

## 2011-06-06 MED ORDER — SODIUM CHLORIDE 0.9 % IV SOLN
INTRAVENOUS | Status: DC
  Filled 2011-06-06: qty 40

## 2011-06-06 MED ORDER — PROPOFOL 10 MG/ML IV BOLUS
INTRAVENOUS | Status: DC | PRN
  Administered 2011-06-06: 40 mg via INTRAVENOUS

## 2011-06-06 MED ORDER — DEXTROSE 5 % IV SOLN
1.5000 g | Freq: Two times a day (BID) | INTRAVENOUS | Status: AC
  Administered 2011-06-06 – 2011-06-08 (×4): 1.5 g via INTRAVENOUS
  Filled 2011-06-06 (×4): qty 1.5

## 2011-06-06 MED ORDER — SODIUM CHLORIDE 0.45 % IV SOLN: INTRAVENOUS | Status: DC

## 2011-06-06 MED ORDER — PHENYLEPHRINE HCL 10 MG/ML IJ SOLN
20.0000 mg | INTRAVENOUS | Status: DC | PRN
  Administered 2011-06-06: 13 ug/min via INTRAVENOUS

## 2011-06-06 MED ORDER — SODIUM CHLORIDE 0.9 % IV SOLN: 250.0000 mL | INTRAVENOUS | Status: DC

## 2011-06-06 MED ORDER — ACETAMINOPHEN 650 MG RE SUPP
650.0000 mg | RECTAL | Status: AC
  Administered 2011-06-06: 650 mg via RECTAL

## 2011-06-06 MED ORDER — SODIUM CHLORIDE 0.9 % IV SOLN
100.0000 [IU] | INTRAVENOUS | Status: DC | PRN
  Administered 2011-06-06: 1.8 [IU]/h via INTRAVENOUS

## 2011-06-06 MED ORDER — CALCIUM CHLORIDE 10 % IV SOLN
INTRAVENOUS | Status: DC | PRN
  Administered 2011-06-06 (×2): .35 g via INTRAVENOUS

## 2011-06-06 MED ORDER — SODIUM CHLORIDE 0.9 % IJ SOLN
3.0000 mL | INTRAMUSCULAR | Status: DC | PRN
  Administered 2011-06-08: 3 mL via INTRAVENOUS

## 2011-06-06 MED ORDER — METOPROLOL TARTRATE 1 MG/ML IV SOLN
2.5000 mg | INTRAVENOUS | Status: DC | PRN
  Administered 2011-06-08 (×2): 2.5 mg via INTRAVENOUS
  Administered 2011-06-08: 3 mg via INTRAVENOUS
  Administered 2011-06-08: 2.5 mg via INTRAVENOUS
  Filled 2011-06-06 (×3): qty 5

## 2011-06-06 MED ORDER — MORPHINE SULFATE 4 MG/ML IJ SOLN
2.0000 mg | INTRAMUSCULAR | Status: DC | PRN
  Administered 2011-06-07: 2 mg via INTRAVENOUS
  Filled 2011-06-06 (×3): qty 1

## 2011-06-06 MED ORDER — VANCOMYCIN HCL 1000 MG IV SOLR
1000.0000 mg | Freq: Once | INTRAVENOUS | Status: AC
  Administered 2011-06-06: 1000 mg via INTRAVENOUS
  Filled 2011-06-06: qty 1000

## 2011-06-06 MED ORDER — DOPAMINE-DEXTROSE 1.6-5 MG/ML-% IV SOLN
INTRAVENOUS | Status: DC | PRN
  Administered 2011-06-06: 3 ug/kg/min via INTRAVENOUS

## 2011-06-06 MED ORDER — ONDANSETRON HCL 4 MG/2ML IJ SOLN
4.0000 mg | Freq: Four times a day (QID) | INTRAMUSCULAR | Status: DC | PRN
  Administered 2011-06-08: 4 mg via INTRAVENOUS
  Filled 2011-06-06: qty 2

## 2011-06-06 MED ORDER — OXYCODONE HCL 5 MG PO TABS: 5.0000 mg | ORAL_TABLET | ORAL | Status: DC | PRN

## 2011-06-06 MED ORDER — BISACODYL 10 MG RE SUPP
10.0000 mg | Freq: Every day | RECTAL | Status: DC
  Administered 2011-06-10: 10 mg via RECTAL
  Filled 2011-06-06: qty 1

## 2011-06-06 MED ORDER — PHENYLEPHRINE HCL 10 MG/ML IJ SOLN
10.0000 mg | INTRAVENOUS | Status: DC | PRN
  Administered 2011-06-06: 1 ug/min via INTRAVENOUS

## 2011-06-06 MED ORDER — ACETAMINOPHEN 160 MG/5ML PO SOLN
975.0000 mg | Freq: Four times a day (QID) | ORAL | Status: AC
  Administered 2011-06-10 – 2011-06-11 (×2): 975 mg
  Filled 2011-06-06 (×3): qty 40.6

## 2011-06-06 MED ORDER — LACTATED RINGERS IV SOLN
INTRAVENOUS | Status: DC | PRN
  Administered 2011-06-06 (×2): via INTRAVENOUS

## 2011-06-06 MED ORDER — SODIUM CHLORIDE 0.9 % IV SOLN
0.1000 ug/kg/h | INTRAVENOUS | Status: DC
  Filled 2011-06-06: qty 2

## 2011-06-06 MED ORDER — POTASSIUM CHLORIDE 10 MEQ/50ML IV SOLN
10.0000 meq | INTRAVENOUS | Status: AC
  Administered 2011-06-06 (×3): 10 meq via INTRAVENOUS

## 2011-06-06 MED ORDER — SODIUM CHLORIDE 0.9 % IV SOLN
10.0000 g | INTRAVENOUS | Status: DC | PRN
  Administered 2011-06-06: 5 g/h via INTRAVENOUS

## 2011-06-06 MED ORDER — NITROGLYCERIN IN D5W 200-5 MCG/ML-% IV SOLN
0.0000 ug/min | INTRAVENOUS | Status: DC
Start: 2011-06-06 — End: 2011-06-09
  Administered 2011-06-07: 10 ug/min via INTRAVENOUS
  Filled 2011-06-06: qty 250

## 2011-06-06 MED ORDER — METOPROLOL TARTRATE 12.5 MG HALF TABLET
12.5000 mg | ORAL_TABLET | ORAL | Status: AC
  Administered 2011-06-06: 12.5 mg via ORAL

## 2011-06-06 MED ORDER — MIDAZOLAM HCL 5 MG/5ML IJ SOLN
INTRAMUSCULAR | Status: DC | PRN
  Administered 2011-06-06: 3 mg via INTRAVENOUS
  Administered 2011-06-06 (×2): 2 mg via INTRAVENOUS
  Administered 2011-06-06: 5 mg via INTRAVENOUS
  Administered 2011-06-06: 3 mg via INTRAVENOUS

## 2011-06-06 MED ORDER — LACTATED RINGERS IV SOLN: 500.0000 mL | Freq: Once | INTRAVENOUS | Status: AC | PRN

## 2011-06-06 MED ORDER — MIDAZOLAM HCL 2 MG/2ML IJ SOLN: 2.0000 mg | INTRAMUSCULAR | Status: DC | PRN

## 2011-06-06 MED ORDER — SODIUM CHLORIDE 0.9 % IJ SOLN
3.0000 mL | Freq: Two times a day (BID) | INTRAMUSCULAR | Status: DC
  Administered 2011-06-07 – 2011-06-11 (×7): 3 mL via INTRAVENOUS

## 2011-06-06 SURGICAL SUPPLY — 162 items
ADAPTER CARDIO PERF ANTE/RETRO (ADAPTER) ×6 IMPLANT
AORTIC HEART VENT ×3 IMPLANT
ATTRACTOMAT 16X20 MAGNETIC DRP (DRAPES) ×3 IMPLANT
BAG DECANTER FOR FLEXI CONT (MISCELLANEOUS) ×6 IMPLANT
BANDAGE ACE 4 STERILE (GAUZE/BANDAGES/DRESSINGS) ×3 IMPLANT
BANDAGE ELASTIC 4 VELCRO ST LF (GAUZE/BANDAGES/DRESSINGS) ×3 IMPLANT
BANDAGE ELASTIC 6 VELCRO ST LF (GAUZE/BANDAGES/DRESSINGS) ×3 IMPLANT
BANDAGE GAUZE ELAST BULKY 4 IN (GAUZE/BANDAGES/DRESSINGS) ×3 IMPLANT
BLADE SAW STERNAL (BLADE) ×6 IMPLANT
BLADE SURG 11 STRL SS (BLADE) ×3 IMPLANT
BLADE SURG 15 STRL LF DISP TIS (BLADE) ×2 IMPLANT
BLADE SURG 15 STRL SS (BLADE) ×1
BLADE SURG ROTATE 9660 (MISCELLANEOUS) ×3 IMPLANT
CANISTER SUCTION 2500CC (MISCELLANEOUS) ×6 IMPLANT
CANN PRFSN .5XCNCT 15X34-48 (MISCELLANEOUS)
CANN PRFSN 3/8X14X24FR PCFC (MISCELLANEOUS) ×2
CANN PRFSN 3/8XCNCT ST RT ANG (MISCELLANEOUS) ×2
CANNULA ARTERIAL NVNT 3/8 20FR (MISCELLANEOUS) ×3 IMPLANT
CANNULA GUNDRY RCSP 15FR (MISCELLANEOUS) IMPLANT
CANNULA PRFSN .5XCNCT 15X34-48 (MISCELLANEOUS) IMPLANT
CANNULA PRFSN 3/8X14X24FR PCFC (MISCELLANEOUS) ×2 IMPLANT
CANNULA PRFSN 3/8XCNCT RT ANG (MISCELLANEOUS) ×2 IMPLANT
CANNULA VEN 1 STAGE STR 66236 (MISCELLANEOUS) IMPLANT
CANNULA VEN 2 STAGE (MISCELLANEOUS)
CANNULA VEN MTL TIP RT (MISCELLANEOUS) ×2
CANNULA VESSEL W/WING WO/VALVE (CANNULA) IMPLANT
CARDIOBLATE CARDIAC ABLATION (MISCELLANEOUS)
CATH CPB KIT GERHARDT (MISCELLANEOUS) ×3 IMPLANT
CATH HEART VENT LEFT (CATHETERS) ×6 IMPLANT
CATH ROBINSON RED A/P 18FR (CATHETERS) ×6 IMPLANT
CATH THORACIC 28FR (CATHETERS) ×3 IMPLANT
CATH THORACIC 28FR RT ANG (CATHETERS) IMPLANT
CATH THORACIC 36FR (CATHETERS) IMPLANT
CATH THORACIC 36FR RT ANG (CATHETERS) IMPLANT
CLIP FOGARTY SPRING 6M (CLIP) IMPLANT
CLIP RETRACTION 3.0MM CORONARY (MISCELLANEOUS) ×3 IMPLANT
CLIP TI MEDIUM 24 (CLIP) IMPLANT
CLIP TI WIDE RED SMALL 24 (CLIP) IMPLANT
CLOTH BEACON ORANGE TIMEOUT ST (SAFETY) ×3 IMPLANT
CONN 1/2X1/2X1/2  BEN (MISCELLANEOUS) ×1
CONN 1/2X1/2X1/2 BEN (MISCELLANEOUS) ×2 IMPLANT
CONN 3/8X1/2 ST GISH (MISCELLANEOUS) IMPLANT
CONN Y 3/8X3/8X3/8  BEN (MISCELLANEOUS) ×1
CONN Y 3/8X3/8X3/8 BEN (MISCELLANEOUS) ×2 IMPLANT
COVER SURGICAL LIGHT HANDLE (MISCELLANEOUS) ×6 IMPLANT
CRADLE DONUT ADULT HEAD (MISCELLANEOUS) ×3 IMPLANT
DEVICE CARDIOBLATE CARDIAC ABL (MISCELLANEOUS) IMPLANT
DRAIN CHANNEL 28F RND 3/8 FF (WOUND CARE) ×3 IMPLANT
DRAIN CHANNEL 32F RND 10.7 FF (WOUND CARE) IMPLANT
DRAPE CARDIOVASCULAR INCISE (DRAPES) ×1
DRAPE SLUSH MACHINE 52X66 (DRAPES) ×3 IMPLANT
DRAPE SLUSH/WARMER DISC (DRAPES) IMPLANT
DRAPE SRG 135X102X78XABS (DRAPES) ×2 IMPLANT
DRSG COVADERM 4X14 (GAUZE/BANDAGES/DRESSINGS) IMPLANT
ELECT BLADE 4.0 EZ CLEAN MEGAD (MISCELLANEOUS) ×3
ELECT CAUTERY BLADE 6.4 (BLADE) ×3 IMPLANT
ELECT REM PT RETURN 9FT ADLT (ELECTROSURGICAL) ×9
ELECTRODE BLDE 4.0 EZ CLN MEGD (MISCELLANEOUS) ×2 IMPLANT
ELECTRODE REM PT RTRN 9FT ADLT (ELECTROSURGICAL) ×6 IMPLANT
FRENCH EYE ×3 IMPLANT
GAUZE KERLIX 2  STERILE LF (GAUZE/BANDAGES/DRESSINGS) ×3 IMPLANT
GAUZE SPONGE 4X4 12PLY STRL LF (GAUZE/BANDAGES/DRESSINGS) ×3 IMPLANT
GLOVE BIO SURGEON STRL SZ 6 (GLOVE) IMPLANT
GLOVE BIO SURGEON STRL SZ 6.5 (GLOVE) ×15 IMPLANT
GLOVE BIO SURGEON STRL SZ7 (GLOVE) IMPLANT
GLOVE BIO SURGEON STRL SZ7.5 (GLOVE) IMPLANT
GLOVE BIOGEL PI IND STRL 6 (GLOVE) ×4 IMPLANT
GLOVE BIOGEL PI IND STRL 6.5 (GLOVE) ×4 IMPLANT
GLOVE BIOGEL PI IND STRL 7.0 (GLOVE) ×6 IMPLANT
GLOVE BIOGEL PI INDICATOR 6 (GLOVE) ×2
GLOVE BIOGEL PI INDICATOR 6.5 (GLOVE) ×2
GLOVE BIOGEL PI INDICATOR 7.0 (GLOVE) ×3
GLOVE ORTHO TXT STRL SZ7.5 (GLOVE) IMPLANT
GOWN STRL NON-REIN LRG LVL3 (GOWN DISPOSABLE) ×15 IMPLANT
HEMOSTAT POWDER SURGIFOAM 1G (HEMOSTASIS) ×9 IMPLANT
HEMOSTAT SURGICEL 2X14 (HEMOSTASIS) ×3 IMPLANT
INSERT FOGARTY 61MM (MISCELLANEOUS) IMPLANT
INSERT FOGARTY XLG (MISCELLANEOUS) IMPLANT
KIT BASIN OR (CUSTOM PROCEDURE TRAY) ×3 IMPLANT
KIT ROOM TURNOVER OR (KITS) ×3 IMPLANT
KIT SUCTION CATH 14FR (SUCTIONS) ×9 IMPLANT
KIT VASOVIEW W/TROCAR VH 2000 (KITS) ×3 IMPLANT
LEAD MYOCARDIAL PACING ×3 IMPLANT
LEAD PACING MYOCARDI (MISCELLANEOUS) ×9 IMPLANT
LINE VENT (MISCELLANEOUS) ×3 IMPLANT
MARKER GRAFT CORONARY BYPASS (MISCELLANEOUS) ×9 IMPLANT
NS IRRIG 1000ML POUR BTL (IV SOLUTION) ×18 IMPLANT
PACK OPEN HEART (CUSTOM PROCEDURE TRAY) ×3 IMPLANT
PAD ARMBOARD 7.5X6 YLW CONV (MISCELLANEOUS) ×6 IMPLANT
PENCIL BUTTON HOLSTER BLD 10FT (ELECTRODE) ×3 IMPLANT
PROBE CRYO2-ABLATION MALLABLE (MISCELLANEOUS) IMPLANT
PUNCH AORTIC ROTATE 4.0MM (MISCELLANEOUS) ×3 IMPLANT
PUNCH AORTIC ROTATE 4.5MM 8IN (MISCELLANEOUS) IMPLANT
PUNCH AORTIC ROTATE 5MM 8IN (MISCELLANEOUS) IMPLANT
RING MITRAL MEMO 3D 30MM SMD30 (Prosthesis & Implant Heart) ×3 IMPLANT
SET CARDIOPLEGIA MPS 5001102 (MISCELLANEOUS) ×3 IMPLANT
SOLUTION ANTI FOG 6CC (MISCELLANEOUS) IMPLANT
SPONGE GAUZE 4X4 12PLY (GAUZE/BANDAGES/DRESSINGS) ×3 IMPLANT
SPONGE INTESTINAL PEANUT (DISPOSABLE) IMPLANT
SPONGE LAP 18X18 X RAY DECT (DISPOSABLE) ×6 IMPLANT
SPONGE LAP 4X18 X RAY DECT (DISPOSABLE) IMPLANT
STOPCOCK MORSE 400PSI 3WAY (MISCELLANEOUS) ×3 IMPLANT
SUCKER WEIGHTED FLEX (MISCELLANEOUS) ×6 IMPLANT
SUT BONE WAX W31G (SUTURE) ×3 IMPLANT
SUT ETHIBOND 2 0 SH (SUTURE) ×1 IMPLANT
SUT ETHIBOND 2 0 SH 36X2 (SUTURE) ×2 IMPLANT
SUT ETHIBOND 2 0 V4 (SUTURE) ×9 IMPLANT
SUT ETHIBOND 2 0V4 GREEN (SUTURE) ×9 IMPLANT
SUT MNCRL AB 4-0 PS2 18 (SUTURE) ×3 IMPLANT
SUT PROLENE 3 0 SH 1 (SUTURE) ×3 IMPLANT
SUT PROLENE 3 0 SH DA (SUTURE) IMPLANT
SUT PROLENE 3 0 SH1 36 (SUTURE) ×6 IMPLANT
SUT PROLENE 4 0 RB 1 (SUTURE) ×3
SUT PROLENE 4 0 SH DA (SUTURE) ×6 IMPLANT
SUT PROLENE 4 0 TF (SUTURE) ×6 IMPLANT
SUT PROLENE 4-0 RB1 .5 CRCL 36 (SUTURE) ×6 IMPLANT
SUT PROLENE 5 0 C 1 36 (SUTURE) ×6 IMPLANT
SUT PROLENE 5 0 CC1 (SUTURE) IMPLANT
SUT PROLENE 6 0 C 1 30 (SUTURE) ×3 IMPLANT
SUT PROLENE 6 0 CC (SUTURE) ×9 IMPLANT
SUT PROLENE 7 0 BV 1 (SUTURE) IMPLANT
SUT PROLENE 7 0 BV1 MDA (SUTURE) ×3 IMPLANT
SUT PROLENE 7.0 RB 3 (SUTURE) IMPLANT
SUT PROLENE 8 0 BV175 6 (SUTURE) ×9 IMPLANT
SUT SILK  1 MH (SUTURE)
SUT SILK 1 MH (SUTURE) IMPLANT
SUT SILK 1 TIES 10X30 (SUTURE) IMPLANT
SUT SILK 2 0 (SUTURE)
SUT SILK 2 0 SH CR/8 (SUTURE) ×3 IMPLANT
SUT SILK 2-0 18XBRD TIE 12 (SUTURE) IMPLANT
SUT SILK 3 0 SH CR/8 (SUTURE) IMPLANT
SUT SILK 4 0 (SUTURE)
SUT SILK 4-0 18XBRD TIE 12 (SUTURE) IMPLANT
SUT STEEL 6MS V (SUTURE) ×3 IMPLANT
SUT STEEL STERNAL CCS#1 18IN (SUTURE) IMPLANT
SUT STEEL SZ 6 DBL 3X14 BALL (SUTURE) ×3 IMPLANT
SUT TEM PAC WIRE 2 0 SH (SUTURE) IMPLANT
SUT VIC AB 1 CTX 18 (SUTURE) ×6 IMPLANT
SUT VIC AB 1 CTX 36 (SUTURE)
SUT VIC AB 1 CTX36XBRD ANBCTR (SUTURE) IMPLANT
SUT VIC AB 2-0 CT1 27 (SUTURE) ×1
SUT VIC AB 2-0 CT1 TAPERPNT 27 (SUTURE) ×2 IMPLANT
SUT VIC AB 2-0 CTX 27 (SUTURE) IMPLANT
SUT VIC AB 3-0 SH 27 (SUTURE)
SUT VIC AB 3-0 SH 27X BRD (SUTURE) IMPLANT
SUT VIC AB 3-0 X1 27 (SUTURE) IMPLANT
SUT VICRYL 4-0 PS2 18IN ABS (SUTURE) IMPLANT
SUTURE E-PAK OPEN HEART (SUTURE) ×3 IMPLANT
SYS ATRICLIP LAA EXCLUSION 45 (CLIP) IMPLANT
SYSTEM SAHARA CHEST DRAIN ATS (WOUND CARE) ×3 IMPLANT
TAPE CLOTH SURG 4X10 WHT LF (GAUZE/BANDAGES/DRESSINGS) ×3 IMPLANT
TOWEL OR 17X24 6PK STRL BLUE (TOWEL DISPOSABLE) ×6 IMPLANT
TOWEL OR 17X26 10 PK STRL BLUE (TOWEL DISPOSABLE) ×6 IMPLANT
TRAY CATH LUMEN 1 20CM STRL (SET/KITS/TRAYS/PACK) ×3 IMPLANT
TRAY FOLEY IC TEMP SENS 14FR (CATHETERS) ×3 IMPLANT
TUBE FEEDING 8FR 16IN STR KANG (MISCELLANEOUS) ×3 IMPLANT
TUBE SUCT INTRACARD DLP 20F (MISCELLANEOUS) ×3 IMPLANT
TUBING ART PRESS 48 MALE/FEM (TUBING) ×3 IMPLANT
TUBING INSUFFLATION 10FT LAP (TUBING) ×3 IMPLANT
UNDERPAD 30X30 INCONTINENT (UNDERPADS AND DIAPERS) ×3 IMPLANT
VENT LEFT HEART 12002 (CATHETERS) ×9
WATER STERILE IRR 1000ML POUR (IV SOLUTION) ×6 IMPLANT

## 2011-06-06 NOTE — Transfer of Care (Signed)
Immediate Anesthesia Transfer of Care Note  Patient: Troy Gregory  Procedure(s) Performed:  CORONARY ARTERY BYPASS GRAFTING (CABG) - grafts times three using left internal mammary artery and right leg greater saphenous vein harvested endoscopically; MAZE; MITRAL VALVE REPAIR (MVR)  Patient Location: PACU and SICU  Anesthesia Type: General  Level of Consciousness: sedated  Airway & Oxygen Therapy: Patient remains intubated per anesthesia plan and Patient placed on Ventilator (see vital sign flow sheet for setting)  Post-op Assessment: Report given to PACU RN and Post -op Vital signs reviewed and stable  Post vital signs: Reviewed and stable  Complications: No apparent anesthesia complications

## 2011-06-06 NOTE — Preoperative (Signed)
Beta Blockers   Reason not to administer Beta Blockers:Not Applicable 

## 2011-06-06 NOTE — Anesthesia Preprocedure Evaluation (Addendum)
Anesthesia Evaluation  Patient identified by MRN, date of birth, ID band Patient awake    Reviewed: Allergy & Precautions, H&P , NPO status   Airway       Dental  (+) Partial Upper and Partial Lower   Pulmonary  clear to auscultation        Cardiovascular Irregular Normal    Neuro/Psych    GI/Hepatic hiatal hernia,   Endo/Other    Renal/GU      Musculoskeletal   Abdominal   Peds  Hematology   Anesthesia Other Findings   Reproductive/Obstetrics                         Anesthesia Physical Anesthesia Plan  ASA: III  Anesthesia Plan: General   Post-op Pain Management:    Induction: Intravenous  Airway Management Planned: Oral ETT  Additional Equipment: PA Cath and 3D TEE  Intra-op Plan:   Post-operative Plan:   Informed Consent: I have reviewed the patients History and Physical, chart, labs and discussed the procedure including the risks, benefits and alternatives for the proposed anesthesia with the patient or authorized representative who has indicated his/her understanding and acceptance.   Dental advisory given  Plan Discussed with: CRNA and Surgeon  Anesthesia Plan Comments: (Severe 3 vessel CAD L. Ventricular Dysfunction Renal insufficiency Cr. 1.6 Mitral insuuficiency)        Anesthesia Quick Evaluation

## 2011-06-06 NOTE — Progress Notes (Signed)
[address]            [place] [address]          [id number]                        Troy Gregory [facility] Medical Record #[id number] Date of Birth: 1936/01/10  Referring: Dr Becky Augusta Primary Cardiology:Dr. Peter Swaziland, MD Primary Care: Kaleen Mask, MD  Chief Complaint:    Syncope and SOB   History of Present Illness:    Patient  is 76 yo with no known history of CAD presents with 100 lb weight lass over two years, onset of atrial fib in may 2012, and two documented syncopal episodes in July and Nov of this  Year. He has had over past several months progressive SOB with an with out exertion, PND frequently, 2 pillow ortho[pnea. He denies lower extremity edema or chest pain. He has no history of prior MI    Current Activity/ Functional Status: Patient is  independent with mobility/ambulation, transfers, ADL's, IADL's.    Past Medical History  Diagnosis Date  . Hypertension 12/2010    was initially orthostatic, but now was back to being hypotensive  . Campath-induced atrial fibrillation May 2012    with a stable rate, on no medications  . Syncope Two episodes July and Nov episode in Nov precipitated Cardiology evaluation      . Benign prostatic hypertrophy Elevated PSA to 13 by patient history bx done "several" years ago    but stable  . Chronic anticoagulation   . Coronary artery disease New DX since cath   . Mitral regurgitation   . Shortness of breath   . Chronic kidney disease 12/2010    with a creatinine on discharge of 1.84, was  2.1 on admission  . Calculus of kidney   . H/O hiatal hernia     Past Surgical History  Procedure Date  . Circumcision 08/12/2002    because of phimosis   Prostate BX   Family History  Problem Relation Age of Onset       . Lung cancer Father 70   . Diabetes Father   . Hypertension- died in sleep age 16 on coumadin for AFIB Mother   . Hypertension Brother        Daughter with lupus       History   Social History  . Marital Status: Married         Number of Children: 2  . Years of Education: N/A   Occupational History  . 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 Smoking status: Former Smoker --quit age 71  .    Marland Kitchen Alcohol Use: No  . Drug Use: No   Other Topics Concern  . Not on file   Social History Narrative  . No narrative on file      No Known Allergies  Current Outpatient Prescriptions  Medication Sig Dispense Refill  . dutasteride (AVODART) 0.5 MG capsule Take 0.5 mg by mouth daily.       . furosemide (LASIX) 80 MG tablet Take 40 mg by mouth every morning.       Marland Kitchen lisinopril (PRINIVIL,ZESTRIL) 20 MG tablet Take 20 mg by mouth every morning.       . warfarin (COUMADIN) 2  MG tablet Take 4-6 mg by mouth every evening. Take 4mg  daily, then on day 5 take 6mg .      . atorvastatin (LIPITOR) 40 MG tablet Take 40 mg by mouth every morning.        . nitroGLYCERIN (NITROSTAT) 0.4 MG SL tablet Place 0.4 mg under the tongue every 5 (five) minutes as needed. FOR CHEST PAIN         Review of Systems  Constitutional: Positive for weight loss and malaise/fatigue. Negative for fever, chills and diaphoresis.  Eyes: Negative.   Respiratory: Positive for cough and shortness of breath. Negative for wheezing.   Cardiovascular: Positive for palpitations, orthopnea and PND. Negative for chest pain and leg swelling.  Gastrointestinal: Negative for heartburn, nausea, abdominal pain, diarrhea, constipation, blood in stool and melena.  Genitourinary: Negative.   Musculoskeletal: Positive for joint pain.  Skin: Negative for itching and rash.  Neurological: Positive for dizziness, loss of consciousness and weakness. Negative for focal weakness and seizures.  Psychiatric/Behavioral: Negative.   Regular dental care No history of colonoscopy  Physical Exam: BP 144/109  Pulse 92  Temp(Src) 97.7 F (36.5 C) (Oral)  Resp 16   Wt 173 lb (78.472 kg)  SpO2 97%  General appearance: alert, cooperative, appears older than stated age, cachectic and no distress Neurologic: intact no carotid bruits Heart: irregularly irregular rhythm and no rub Lungs: clear to auscultation bilaterally Abdomen: soft, non-tender; bowel sounds normal; no masses,  no organomegaly Extremities: extremities normal, atraumatic, no cyanosis or edema, Homans sign is negative, no sign of DVT, no edema, redness or tenderness in the calves or thighs and no ulcers, gangrene or trophic changes Palpable distal pulses in feet bilateral No palpable AAA  Diagnostic Studies & Laboratory data:     Recent Radiology Findings:   Dg Chest 2 View  06/04/2011  *RADIOLOGY REPORT*  Clinical Data: Preop evaluation.  History of coronary artery disease.  History of mitral valvular prolapse.  For CABG and mitral valvular repair.  History of hypertension and atrial fibrillation. History of hiatal hernia.  CHEST - 2 VIEW  Comparison: 04/22/2011.  Findings: There is slight enlargement of the cardiac silhouette. Mediastinal and hilar contours appear stable.  No pulmonary edema, pneumonia, or pleural effusion is seen.  There is osteophyte formation in the spine.  IMPRESSION: Slight enlargement of the cardiac silhouette.  No pulmonary edema, pneumonia, or other acute abnormality is seen.  Original Report Authenticated By: Crawford Givens, M.D.      Recent Lab Findings: Lab Results  Component Value Date   WBC 7.0 06/04/2011   HGB 14.8 06/04/2011   HCT 44.0 06/04/2011   PLT 153 06/04/2011   GLUCOSE 180* 06/04/2011   ALT 27 06/04/2011   AST 28 06/04/2011   NA 140 06/04/2011   K 3.3* 06/04/2011   CL 102 06/04/2011   CREATININE 1.60* 06/04/2011   BUN 22 06/04/2011   CO2 24 06/04/2011   TSH 1.38 05/21/2011   INR 1.53* 06/04/2011   HGBA1C 6.1* 06/04/2011   Echo 05/16/11 Transthoracic Echocardiography  Patient: Troy Gregory, Troy Gregory MR #: [id number] Study Date:  05/16/2011 Gender: M Age: 71 Height: 182.9cm Weight: 80.3kg BSA: 2.65m^2 Pt. Status: Room:  ATTENDING Peter Swaziland, MD [facility] Peter Swaziland, MD REFERRING Peter Swaziland, MD PERFORMING [facility], [facility] SONOGRAPHER Junious Dresser, RDCS cc:  ------------------------------------------------------------ LV EF: 20% - 25%  ------------------------------------------------------------ Indications: Atrial fibrillation - 427.31. Re-evaluate LV function.  ------------------------------------------------------------ History: PMH: Acquired from the patient and from the patient's chart.  Fatigue, syncope, and dyspnea. Atrial fibrillation. Left ventricular dysfunction, with an ejection fraction of 23%by radionuclide ventriculography. Mild mitral regurgitation. Renal disease. Risk factors: Hypertension.  ------------------------------------------------------------ Study Conclusions  - Left ventricle: The cavity size was normal. There was mild focal basal hypertrophy of the septum. Systolic function was severely reduced. The estimated ejection fraction was in the range of 20% to 25%. Diffuse hypokinesis. - Aortic valve: Trivial regurgitation. - Mitral valve: Moderate regurgitation. - Left atrium: The atrium was moderately dilated. - Right ventricle: The cavity size was mildly dilated. - Right atrium: The atrium was mildly dilated. - Pericardium, extracardiac: A trivial pericardial effusion was identified.  ------------------------------------------------------------ Labs, prior tests, procedures, and surgery: Echocardiography (July 2012). The mitral valve showed mild regurgitation. EF was 55%.  Stress nuclear imaging (December 2012). The study demonstrated marked LV dilation with severe global dysfunction. EF was 23%. Transthoracic echocardiography. M-mode, limited 2D, limited spectral Doppler, and color Doppler. Height: Height: 182.9cm. Height: 72in. Weight: Weight: 80.3kg.  Weight: 176.6lb. Body mass index: BMI: 24kg/m^2. Body surface area: BSA: 2.43m^2. Blood pressure: 138/94. Patient status: Outpatient. Location: [facility]  ------------------------------------------------------------  ------------------------------------------------------------ Left ventricle: The cavity size was normal. There was mild focal basal hypertrophy of the septum. Systolic function was severely reduced. The estimated ejection fraction was in the range of 20% to 25%. Diffuse hypokinesis.  ------------------------------------------------------------ Aortic valve: Trileaflet; mildly thickened leaflets. Doppler: There was no stenosis. Trivial regurgitation.  ------------------------------------------------------------ Aorta: Aortic root: The aortic root was normal in size.  ------------------------------------------------------------ Mitral valve: Structurally normal valve. Leaflet separation was normal. Doppler: Transvalvular velocity was within the normal range. There was no evidence for stenosis. Moderate regurgitation.  ------------------------------------------------------------ Left atrium: The atrium was moderately dilated.  ------------------------------------------------------------ Right ventricle: The cavity size was mildly dilated. Systolic function was low normal.  ------------------------------------------------------------ Pulmonic valve: The valve appears to be grossly normal. Doppler: Mild regurgitation.  ------------------------------------------------------------ Tricuspid valve: Structurally normal valve. Leaflet separation was normal. Doppler: Transvalvular velocity was within the normal range. Mild regurgitation.  ------------------------------------------------------------ Right atrium: The atrium was mildly dilated.  ------------------------------------------------------------ Pericardium: A trivial pericardial effusion was  identified.  ------------------------------------------------------------ Systemic veins: Inferior vena cava: The vessel was normal in size; the respirophasic diameter changes were in the normal range (= 50%); findings are consistent with normal central venous pressure.  ------------------------------------------------------------  2D measurements Normal Left ventricle LVID ED, 51 mm 43-52 chord, PLAX LVID ES, 48 mm 23-38 chord, PLAX FS, chord, 6 % >29 PLAX LVPW, ED 9 mm ------ IVS/LVPW 1.44 <1.3 ratio, ED Ventricular septum IVS, ED 13 mm ------ LVOT Diam, S 21 mm ------ Area 3.46 cm^2 ------ Aorta Root diam, 37 mm ------ ED Left atrium AP dim 42 mm ------ AP dim 2.08 cm/m^2 <2.2 index  ------------------------------------------------------------ Prepared and Electronically Authenticated by  Olga Millers, MD, [facility] 2012-12-12T10:34:39.227  Cardiac Cath: Cardiac Cath Note  Troy Gregory  [id number]  1935-11-27  Procedure: Right and left Heart Cardiac Catheterization Note  Indications: Congestive heart failure  Procedure Details  Consent: Obtained  Time Out: Verified patient identification, verified procedure, site/side was marked, verified correct patient position, special equipment/implants available, Radiology Safety Procedures followed, medications/allergies/relevent history reviewed, required imaging and test results available. Performed  The right femoral artery was easily canulated using a modified Seldinger technique.  Hemodynamics:  RA: 12/11/9  RV: 38/11  PCWP: 25/24/20 ( prominent V waves suggestive of TR)  PA: 42/21/30  Cardiac Output  Thermodilution: 2.5 lpm, index of 1.2  Fick : 3.7 lpm, index 1.8  Arterial Sat: 92  PA Sat: 57.  LV pressure: 103/18  Aortic pressure: 101/81  Angiography : It was very difficult to cannulate the left main. We ended up using a Judkins left 5 catheter. It is very difficult to maintain catheter position. We used  60 cc of contrast for angiography  Left Main: The left main has mild irregularities in the proximal and midsegment. There is a 70% stenosis in the distal left main. This stenosis actually continues for and involves the origin of the left circumflex artery and left anterior descending artery.  Left anterior Descending: There's a 70% irregular stenosis in the origin of the left anterior descending artery. The stenosis actually involves the left main and origin of the left circumflex artery. The LAD has moderate irregularities throughout its course. It gives off a large first diagonal artery that has mild regularities. The second diagonal artery has a 50-60% stenosis at its proximal segment. There is a discrete 60-70% stenosis in the distal left anterior descending artery.  There is a large septal perforator branch that has a 70-80% stenosis at its origin. This septal perforator provides most of the filling to the intraventricular septum.  Left Circumflex: The left complex artery is relatively small vessel. There is a 70% stenosis at its origin. There is a 80-90% stenosis in the distal circumflex artery just prior to giving off a small obtuse marginal branch.  Right Coronary Artery: Right coronary artery was also very difficult to engage. There is a hazy stenosis at the origin of the right coronary artery that is associated with a 20-30% stenosis at its origin. The right coronary artery is very dilated and ectatic. The posterior descending artery has a 40-50% stenosis at its origin.  LV Gram: No left ventriculogram was performed because of a creatinine of 2.0. We measured pressures in the left ventricle and then an LV/AO pullback.  Complications: No apparent complications  Patient did tolerate procedure well.  Conclusions:  1. Coronary artery disease: The patient has significant coronary artery disease involving the left main, left anterior descending artery, and left circumflex artery. These lesions are  nonmovable 2 percutaneous coronary intervention and. We'll consult the CV surgeons.  2. Congestive heart failure: The patient has a known ejection fraction of 20-25% by echo. His cardiac output is moderately to severely depressed by Fick and thermodilution calculation.  3. Chronic renal insufficiency: The patient's baseline creatinine is 2.0. We tried to use a minimal amount of contrast. We used 60 cc of contrast during the case. We had considerable difficulty keeping the cath was engaged in the coronary arteries.  [name], [name]., MD, [facility]  05/25/2011, 10:53 AM  Assessment / Plan:   Severe  ischemic cardiomyopathy with left main stenosis with failure symptoms but without anginia. Moderate MR A Fib of 8 month duration on coumadin Unexplained weight lass past 2 years  CABG with TEE , placement of atrial clip, poss MAZE  Has been recommended to the patient, If TEE shows significant MR  Mitral ring will be considered. With the degree of lv dysfunction, afib, wt loss with deceased albumin patient is at greater risk of surgical intervention.   The goals risks and alternatives of the planned surgical procedure CABG poss MAZE pass Mitral ring have been discussed with the patient in detail. The risks of the procedure including death, infection, stroke, myocardial infarction, bleeding, blood transfusion have all been discussed specifically.  I have quoted [name] a 10 % of perioperative mortality and  a complication rate as high as 40%. The patient's questions have been answered.Troy Gregory is willing  to proceed with the planned procedure.  [name] MD  Beeper [phone] Office [phone]        ROS

## 2011-06-06 NOTE — Brief Op Note (Signed)
06/06/2011  3:44 PM  PATIENT:  Troy Gregory  76 y.o. male  PRE-OPERATIVE DIAGNOSIS:  Coronary artery disease, mitral valve regurgitation, atrial fibrillation  POST-OPERATIVE DIAGNOSIS:  Coronary artery disease, mitral valve regurgitation, atrial fibrillation  PROCEDURE:  Procedure(s): CORONARY ARTERY BYPASS GRAFTING (CABG) X 3 with LIMA to LAD, Vein to Intermediate, Vein to RCA with Right calf and thigh endovein harvest Left MAZE and closure of left atriaf appendage MITRAL VALVE REPAIR (MVR) with #30 Sorin SMD30 Ring SN [id number] annuloplastry Placement of rt femoral arterial line  SURGEON:  Surgeon(s): [name], MD  PHYSICIAN ASSISTANT: Gershon Crane PA  ASSISTANTS:  Cecile Sheerer SA  ANESTHESIA:   general  EBL:  Total I/O In: 4125 [I.V.:3000; Blood:875; IV Piggyback:250] Out: 3560 [Urine:1400; Blood:2160]  BLOOD ADMINISTERED:875 CC CELLSAVER  DRAINS: one #28  Chest Tube(s) in the left  and (one ) Blake drain(s) in the mediastinum    COUNTS:  YES   DICTATION: .Other Dictation: Dictation Number   PLAN OF CARE: Admit to inpatient   PATIENT DISPOSITION:  ICU - intubated and hemodynamically stable.   Delay start of Pharmacological VTE agent (>24hrs) due to surgical blood loss or risk of bleeding:  {YES/NO/NOT APPLICABLE:20182

## 2011-06-06 NOTE — Anesthesia Procedure Notes (Signed)
Procedures In PACU at 1335, patient became apneic briefly after being given Dialudid and versed. Dr. Michelle Piper in PACU and immediately mask ventilated patient by AMBU bag then given 1/2 amp narcan-0.2mg . Patient immediately responded and was alert, oriented and talking immediately afterwards at 1340.

## 2011-06-06 NOTE — H&P (Signed)
[address]            [place] [address]          [id number]                                      MARX DOIG [facility] Medical Record #[id number] Date of Birth: 1935-07-15  Referring: Dr Becky Augusta Primary Cardiology:Dr. Peter Swaziland, MD Primary Care: Kaleen Mask, MD  Chief Complaint:    Syncope and SOB   History of Present Illness:    Patient  is 76 yo with no known history of CAD presents with 100 lb weight lass over two years, onset of atrial fib in may 2012, and two documented syncopal episodes in July and Nov of this  Year. He has had over past several months progressive SOB with an with out exertion, PND frequently, 2 pillow ortho[pnea. He denies lower extremity edema or chest pain. He has no history of prior MI    Current Activity/ Functional Status: Patient is  independent with mobility/ambulation, transfers, ADL's, IADL's.    Past Medical History  Diagnosis Date  . Hypertension 12/2010    was initially orthostatic, but now was back to being hypotensive  . Campath-induced atrial fibrillation May 2012    with a stable rate, on no medications  . Syncope Two episodes July and Nov episode in Nov precipitated Cardiology evaluation      . Benign prostatic hypertrophy Elevated PSA to 13 by patient history bx done "several" years ago    but stable  . Chronic anticoagulation   . Coronary artery disease New DX since cath   . Mitral regurgitation   . Shortness of breath   . Chronic kidney disease 12/2010    with a creatinine on discharge of 1.84, was  2.1 on admission  . Calculus of kidney   . H/O hiatal hernia     Past Surgical History  Procedure Date  . Circumcision 08/12/2002    because of phimosis   Prostate BX   Family History  Problem Relation Age of Onset       . Lung cancer Father 77   . Diabetes Father   . Hypertension- died in sleep age 40 on coumadin for AFIB Mother   . Hypertension Brother    Daughter with lupus      History   Social History  . Marital Status: Married         Number of Children: 2  . Years of Education: N/A   Occupational History  . 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 Smoking status: Former Smoker --quit age 11  .    Marland Kitchen Alcohol Use: No  . Drug Use: No   Other Topics Concern  . Not on file   Social History Narrative  . No narrative on file      No Known Allergies  Current Outpatient Prescriptions  Medication Sig Dispense Refill  . dutasteride (AVODART) 0.5 MG capsule Take 0.5 mg by mouth daily.       . furosemide (LASIX) 80 MG tablet Take 40 mg by mouth every morning.       Marland Kitchen lisinopril (PRINIVIL,ZESTRIL) 20 MG tablet Take 20 mg by mouth every morning.       Marland Kitchen  warfarin (COUMADIN) 2 MG tablet Take 4-6 mg by mouth every evening. Take 4mg  daily, then on day 5 take 6mg .      . atorvastatin (LIPITOR) 40 MG tablet Take 40 mg by mouth every morning.        . nitroGLYCERIN (NITROSTAT) 0.4 MG SL tablet Place 0.4 mg under the tongue every 5 (five) minutes as needed. FOR CHEST PAIN         Review of Systems  Constitutional: Positive for weight loss and malaise/fatigue. Negative for fever, chills and diaphoresis.  Eyes: Negative.   Respiratory: Positive for cough and shortness of breath. Negative for wheezing.   Cardiovascular: Positive for palpitations, orthopnea and PND. Negative for chest pain and leg swelling.  Gastrointestinal: Negative for heartburn, nausea, abdominal pain, diarrhea, constipation, blood in stool and melena.  Genitourinary: Negative.   Musculoskeletal: Positive for joint pain.  Skin: Negative for itching and rash.  Neurological: Positive for dizziness, loss of consciousness and weakness. Negative for focal weakness and seizures.  Psychiatric/Behavioral: Negative.   Regular dental care No history of colonoscopy  Physical Exam: BP 144/109  Pulse 92  Temp(Src) 97.7 F (36.5 C)  (Oral)  Resp 16  Wt 173 lb (78.472 kg)  SpO2 97%  General appearance: alert, cooperative, appears older than stated age, cachectic and no distress Neurologic: intact no carotid bruits Heart: irregularly irregular rhythm and no rub Lungs: clear to auscultation bilaterally Abdomen: soft, non-tender; bowel sounds normal; no masses,  no organomegaly Extremities: extremities normal, atraumatic, no cyanosis or edema, Homans sign is negative, no sign of DVT, no edema, redness or tenderness in the calves or thighs and no ulcers, gangrene or trophic changes Palpable distal pulses in feet bilateral No palpable AAA  Diagnostic Studies & Laboratory data:     Recent Radiology Findings:   Dg Chest 2 View  06/04/2011  *RADIOLOGY REPORT*  Clinical Data: Preop evaluation.  History of coronary artery disease.  History of mitral valvular prolapse.  For CABG and mitral valvular repair.  History of hypertension and atrial fibrillation. History of hiatal hernia.  CHEST - 2 VIEW  Comparison: 04/22/2011.  Findings: There is slight enlargement of the cardiac silhouette. Mediastinal and hilar contours appear stable.  No pulmonary edema, pneumonia, or pleural effusion is seen.  There is osteophyte formation in the spine.  IMPRESSION: Slight enlargement of the cardiac silhouette.  No pulmonary edema, pneumonia, or other acute abnormality is seen.  Original Report Authenticated By: Crawford Givens, M.D.      Recent Lab Findings: Lab Results  Component Value Date   WBC 7.0 06/04/2011   HGB 14.8 06/04/2011   HCT 44.0 06/04/2011   PLT 153 06/04/2011   GLUCOSE 180* 06/04/2011   ALT 27 06/04/2011   AST 28 06/04/2011   NA 140 06/04/2011   K 3.3* 06/04/2011   CL 102 06/04/2011   CREATININE 1.60* 06/04/2011   BUN 22 06/04/2011   CO2 24 06/04/2011   TSH 1.38 05/21/2011   INR 1.53* 06/04/2011   HGBA1C 6.1* 06/04/2011   Echo 05/16/11 Transthoracic Echocardiography  Patient: Troy Gregory, Hauschild MR #: [id number] Study  Date: 05/16/2011 Gender: M Age: 6 Height: 182.9cm Weight: 80.3kg BSA: 2.69m^2 Pt. Status: Room:  ATTENDING Peter Swaziland, MD [facility] Peter Swaziland, MD REFERRING Peter Swaziland, MD PERFORMING [facility] SONOGRAPHER Junious Dresser, RDCS cc:  ------------------------------------------------------------ LV EF: 20% - 25%  ------------------------------------------------------------ Indications: Atrial fibrillation - 427.31. Re-evaluate LV function.  ------------------------------------------------------------ History: PMH: Acquired from the patient and from  the patient's chart. Fatigue, syncope, and dyspnea. Atrial fibrillation. Left ventricular dysfunction, with an ejection fraction of 23%by radionuclide ventriculography. Mild mitral regurgitation. Renal disease. Risk factors: Hypertension.  ------------------------------------------------------------ Study Conclusions  - Left ventricle: The cavity size was normal. There was mild focal basal hypertrophy of the septum. Systolic function was severely reduced. The estimated ejection fraction was in the range of 20% to 25%. Diffuse hypokinesis. - Aortic valve: Trivial regurgitation. - Mitral valve: Moderate regurgitation. - Left atrium: The atrium was moderately dilated. - Right ventricle: The cavity size was mildly dilated. - Right atrium: The atrium was mildly dilated. - Pericardium, extracardiac: A trivial pericardial effusion was identified.  ------------------------------------------------------------ Labs, prior tests, procedures, and surgery: Echocardiography (July 2012). The mitral valve showed mild regurgitation. EF was 55%.  Stress nuclear imaging (December 2012). The study demonstrated marked LV dilation with severe global dysfunction. EF was 23%. Transthoracic echocardiography. M-mode, limited 2D, limited spectral Doppler, and color Doppler. Height: Height: 182.9cm. Height: 72in. Weight: Weight:  80.3kg. Weight: 176.6lb. Body mass index: BMI: 24kg/m^2. Body surface area: BSA: 2.86m^2. Blood pressure: 138/94. Patient status: Outpatient. Location: [place] [facility]  ------------------------------------------------------------  ------------------------------------------------------------ Left ventricle: The cavity size was normal. There was mild focal basal hypertrophy of the septum. Systolic function was severely reduced. The estimated ejection fraction was in the range of 20% to 25%. Diffuse hypokinesis.  ------------------------------------------------------------ Aortic valve: Trileaflet; mildly thickened leaflets. Doppler: There was no stenosis. Trivial regurgitation.  ------------------------------------------------------------ Aorta: Aortic root: The aortic root was normal in size.  ------------------------------------------------------------ Mitral valve: Structurally normal valve. Leaflet separation was normal. Doppler: Transvalvular velocity was within the normal range. There was no evidence for stenosis. Moderate regurgitation.  ------------------------------------------------------------ Left atrium: The atrium was moderately dilated.  ------------------------------------------------------------ Right ventricle: The cavity size was mildly dilated. Systolic function was low normal.  ------------------------------------------------------------ Pulmonic valve: The valve appears to be grossly normal. Doppler: Mild regurgitation.  ------------------------------------------------------------ Tricuspid valve: Structurally normal valve. Leaflet separation was normal. Doppler: Transvalvular velocity was within the normal range. Mild regurgitation.  ------------------------------------------------------------ Right atrium: The atrium was mildly dilated.  ------------------------------------------------------------ Pericardium: A trivial pericardial effusion was  identified.  ------------------------------------------------------------ Systemic veins: Inferior vena cava: The vessel was normal in size; the respirophasic diameter changes were in the normal range (= 50%); findings are consistent with normal central venous pressure.  ------------------------------------------------------------  2D measurements Normal Left ventricle LVID ED, 51 mm 43-52 chord, PLAX LVID ES, 48 mm 23-38 chord, PLAX FS, chord, 6 % >29 PLAX LVPW, ED 9 mm ------ IVS/LVPW 1.44 <1.3 ratio, ED Ventricular septum IVS, ED 13 mm ------ LVOT Diam, S 21 mm ------ Area 3.46 cm^2 ------ Aorta Root diam, 37 mm ------ ED Left atrium AP dim 42 mm ------ AP dim 2.08 cm/m^2 <2.2 index  ------------------------------------------------------------ Prepared and Electronically Authenticated by  Olga Millers, MD, [facility] 2012-12-12T10:34:39.227  Cardiac Cath: Cardiac Cath Note  JEFFERIE HOLSTON  [id number]  [birth year]  Procedure: Right and left Heart Cardiac Catheterization Note  Indications: Congestive heart failure  Procedure Details  Consent: Obtained  Time Out: Verified patient identification, verified procedure, site/side was marked, verified correct patient position, special equipment/implants available, Radiology Safety Procedures followed, medications/allergies/relevent history reviewed, required imaging and test results available. Performed  The right femoral artery was easily canulated using a modified Seldinger technique.  Hemodynamics:  RA: 12/11/9  RV: 38/11  PCWP: 25/24/20 ( prominent V waves suggestive of TR)  PA: 42/21/30  Cardiac Output  Thermodilution: 2.5 lpm, index of 1.2  Fick : 3.7  lpm, index 1.8  Arterial Sat: 92  PA Sat: 57.  LV pressure: 103/18  Aortic pressure: 101/81  Angiography : It was very difficult to cannulate the left main. We ended up using a Judkins left 5 catheter. It is very difficult to maintain catheter position. We used  60 cc of contrast for angiography  Left Main: The left main has mild irregularities in the proximal and midsegment. There is a 70% stenosis in the distal left main. This stenosis actually continues for and involves the origin of the left circumflex artery and left anterior descending artery.  Left anterior Descending: There's a 70% irregular stenosis in the origin of the left anterior descending artery. The stenosis actually involves the left main and origin of the left circumflex artery. The LAD has moderate irregularities throughout its course. It gives off a large first diagonal artery that has mild regularities. The second diagonal artery has a 50-60% stenosis at its proximal segment. There is a discrete 60-70% stenosis in the distal left anterior descending artery.  There is a large septal perforator branch that has a 70-80% stenosis at its origin. This septal perforator provides most of the filling to the intraventricular septum.  Left Circumflex: The left complex artery is relatively small vessel. There is a 70% stenosis at its origin. There is a 80-90% stenosis in the distal circumflex artery just prior to giving off a small obtuse marginal branch.  Right Coronary Artery: Right coronary artery was also very difficult to engage. There is a hazy stenosis at the origin of the right coronary artery that is associated with a 20-30% stenosis at its origin. The right coronary artery is very dilated and ectatic. The posterior descending artery has a 40-50% stenosis at its origin.  LV Gram: No left ventriculogram was performed because of a creatinine of 2.0. We measured pressures in the left ventricle and then an LV/AO pullback.  Complications: No apparent complications  Patient did tolerate procedure well.  Conclusions:  1. Coronary artery disease: The patient has significant coronary artery disease involving the left main, left anterior descending artery, and left circumflex artery. These lesions are  nonmovable 2 percutaneous coronary intervention and. We'll consult the CV surgeons.  2. Congestive heart failure: The patient has a known ejection fraction of 20-25% by echo. His cardiac output is moderately to severely depressed by Fick and thermodilution calculation.  3. Chronic renal insufficiency: The patient's baseline creatinine is 2.0. We tried to use a minimal amount of contrast. We used 60 cc of contrast during the case. We had considerable difficulty keeping the cath was engaged in the coronary arteries.  [name], [name]., MD, [facility]  05/25/2011, 10:53 AM  Assessment / Plan:   Severe  ischemic cardiomyopathy with left main stenosis with failure symptoms but without anginia. Moderate MR A Fib of 8 month duration on coumadin Unexplained weight lass past 2 years  CABG with TEE , placement of atrial clip, poss MAZE  Has been recommended to the patient, If TEE shows significant MR  Mitral ring will be considered. With the degree of lv dysfunction, afib, wt loss with deceased albumin patient is at greater risk of surgical intervention.   The goals risks and alternatives of the planned surgical procedure CABG poss MAZE pass Mitral ring have been discussed with the patient in detail. The risks of the procedure including death, infection, stroke, myocardial infarction, bleeding, blood transfusion have all been discussed specifically.  I have quoted [name] a 10 %  of perioperative mortality and a complication rate as high as 40%. The patient's questions have been answered.BERISH BOHMAN is willing  to proceed with the planned procedure.  [name] MD 7:18 AM  06/07/2011 Beeper [phone] Office [phone]

## 2011-06-06 NOTE — Progress Notes (Signed)
  Echocardiogram 2D Echocardiogram has been performed.  Troy Gregory [facility] 06/06/2011, 9:34 AM

## 2011-06-06 NOTE — Progress Notes (Signed)
ANTIBIOTIC CONSULT NOTE - INITIAL  Pharmacy Consult for Vancomycin  Indication:  24 Hour Post-op Coverage  No Known Allergies  Patient Measurements: Weight: 173 lb (78.472 kg)  Assessment: 76 yo with CAD - s/p OHS without noted complications.  He received IV Vancomycin and Cefuroxime pre-op without adverse event.  He is to continue with IV Vancomycin x 24 hours for post-op prophylaxis.  Goal of Therapy:  Infection prevention.  Plan:  Vancomycin 1 gm IV x 1 to be given at midnight tonight. This will provide appropriate 24 hours post-op coverage.  Nadara Mustard, PharmD., MS Clinical Pharmacist Pager:  [phone] 06/06/2011,4:21 PM    Vital Signs: Temp: 97.7 F (36.5 C) (01/02 0640) Temp src: Oral (01/02 0640) BP: 144/109 mmHg (01/02 0640) Pulse Rate: 92  (01/02 0640) Intake/Output from previous day:   Intake/Output from this shift: Total I/O In: 4375 [I.V.:3000; Blood:875; IV Piggyback:500] Out: 3810 [Urine:1650; Blood:2160]  Labs:  Basename 06/06/11 1510 06/06/11 1407 06/06/11 1404 06/04/11 0857  WBC -- -- -- 7.0  HGB 8.8* 10.2* 10.2* --  PLT -- -- 100* 153  LABCREA -- -- -- --  CREATININE -- -- -- 1.60*     Microbiology: Recent Results (from the past 720 hour(s))  SURGICAL PCR SCREEN     Status: Normal   Collection Time   06/04/11  8:38 AM      Component Value Range Status Comment   MRSA, PCR NEGATIVE  NEGATIVE  Final    Staphylococcus aureus NEGATIVE  NEGATIVE  Final     Medical History: Past Medical History  Diagnosis Date  . Hypertension 12/2010    was initially orthostatic, but now was back to being hypotensive  . Campath-induced atrial fibrillation     with a stable rate, on no medications  . Syncope     Near-syncope, etiology is deemed secondary to the excessive heat  . Benign prostatic hypertrophy     but stable  . Chronic anticoagulation   . Coronary artery disease   . Mitral regurgitation   . Shortness of breath   . Chronic kidney  disease 12/2010    with a creatinine on discharge of 1.84, was  2.1 on admission  . Calculus of kidney   . H/O hiatal hernia

## 2011-06-07 ENCOUNTER — Encounter (HOSPITAL_COMMUNITY): Payer: Self-pay | Admitting: Cardiothoracic Surgery

## 2011-06-07 ENCOUNTER — Encounter (HOSPITAL_COMMUNITY): Payer: Self-pay | Admitting: Anesthesiology

## 2011-06-07 ENCOUNTER — Inpatient Hospital Stay (HOSPITAL_COMMUNITY): Payer: Medicare Other

## 2011-06-07 ENCOUNTER — Other Ambulatory Visit: Payer: Self-pay

## 2011-06-07 LAB — CBC
HCT: 30.7 % — ABNORMAL LOW (ref 39.0–52.0)
HCT: 30.2 % — ABNORMAL LOW (ref 39.0–52.0)
Hemoglobin: 10.4 g/dL — ABNORMAL LOW (ref 13.0–17.0)
Hemoglobin: 10.2 g/dL — ABNORMAL LOW (ref 13.0–17.0)
MCH: 30.6 pg (ref 26.0–34.0)
MCH: 30.5 pg (ref 26.0–34.0)
MCHC: 33.9 g/dL (ref 30.0–36.0)
MCHC: 33.8 g/dL (ref 30.0–36.0)
MCV: 90.3 fL (ref 78.0–100.0)
MCV: 90.4 fL (ref 78.0–100.0)
Platelets: 85 10*3/uL — ABNORMAL LOW (ref 150–400)
Platelets: 85 10*3/uL — ABNORMAL LOW (ref 150–400)
RBC: 3.4 MIL/uL — ABNORMAL LOW (ref 4.22–5.81)
RBC: 3.34 MIL/uL — ABNORMAL LOW (ref 4.22–5.81)
RDW: 13.4 % (ref 11.5–15.5)
RDW: 13.7 % (ref 11.5–15.5)
WBC: 18.1 10*3/uL — ABNORMAL HIGH (ref 4.0–10.5)
WBC: 22.3 10*3/uL — ABNORMAL HIGH (ref 4.0–10.5)

## 2011-06-07 LAB — POCT I-STAT 3, ART BLOOD GAS (G3+)
Acid-base deficit: 3 mmol/L — ABNORMAL HIGH (ref 0.0–2.0)
Acid-base deficit: 3 mmol/L — ABNORMAL HIGH (ref 0.0–2.0)
Bicarbonate: 21.9 mEq/L (ref 20.0–24.0)
Bicarbonate: 22.1 mEq/L (ref 20.0–24.0)
O2 Saturation: 95 %
O2 Saturation: 98 %
Patient temperature: 37.9
Patient temperature: 38.2
TCO2: 23 mmol/L (ref 0–100)
TCO2: 23 mmol/L (ref 0–100)
pCO2 arterial: 39.7 mmHg (ref 35.0–45.0)
pCO2 arterial: 39.7 mmHg (ref 35.0–45.0)
pH, Arterial: 7.354 (ref 7.350–7.450)
pH, Arterial: 7.359 (ref 7.350–7.450)
pO2, Arterial: 119 mmHg — ABNORMAL HIGH (ref 80.0–100.0)
pO2, Arterial: 83 mmHg (ref 80.0–100.0)

## 2011-06-07 LAB — BASIC METABOLIC PANEL
BUN: 19 mg/dL (ref 6–23)
CO2: 22 mEq/L (ref 19–32)
Calcium: 9 mg/dL (ref 8.4–10.5)
Chloride: 111 mEq/L (ref 96–112)
Creatinine, Ser: 1.53 mg/dL — ABNORMAL HIGH (ref 0.50–1.35)
GFR calc Af Amer: 50 mL/min — ABNORMAL LOW (ref >90)
GFR calc non Af Amer: 43 mL/min — ABNORMAL LOW (ref >90)
Glucose, Bld: 174 mg/dL — ABNORMAL HIGH (ref 70–99)
Potassium: 4 mEq/L (ref 3.5–5.1)
Sodium: 141 mEq/L (ref 135–145)

## 2011-06-07 LAB — GLUCOSE, CAPILLARY
Glucose-Capillary: 180 mg/dL — ABNORMAL HIGH (ref 70–99)
Glucose-Capillary: 103 mg/dL — ABNORMAL HIGH (ref 70–99)
Glucose-Capillary: 106 mg/dL — ABNORMAL HIGH (ref 70–99)
Glucose-Capillary: 144 mg/dL — ABNORMAL HIGH (ref 70–99)
Glucose-Capillary: 154 mg/dL — ABNORMAL HIGH (ref 70–99)
Glucose-Capillary: 79 mg/dL (ref 70–99)
Glucose-Capillary: 84 mg/dL (ref 70–99)
Glucose-Capillary: 93 mg/dL (ref 70–99)
Glucose-Capillary: 133 mg/dL — ABNORMAL HIGH (ref 70–99)
Glucose-Capillary: 210 mg/dL — ABNORMAL HIGH (ref 70–99)
Glucose-Capillary: 174 mg/dL — ABNORMAL HIGH (ref 70–99)
Glucose-Capillary: 182 mg/dL — ABNORMAL HIGH (ref 70–99)

## 2011-06-07 LAB — CREATININE, SERUM
Creatinine, Ser: 1.73 mg/dL — ABNORMAL HIGH (ref 0.50–1.35)
GFR calc Af Amer: 43 mL/min — ABNORMAL LOW (ref >90)
GFR calc non Af Amer: 37 mL/min — ABNORMAL LOW (ref >90)

## 2011-06-07 LAB — POCT I-STAT, CHEM 8
BUN: 23 mg/dL (ref 6–23)
Calcium, Ion: 1.31 mmol/L (ref 1.12–1.32)
Chloride: 110 mEq/L (ref 96–112)
Creatinine, Ser: 1.7 mg/dL — ABNORMAL HIGH (ref 0.50–1.35)
Glucose, Bld: 159 mg/dL — ABNORMAL HIGH (ref 70–99)
HCT: 29 % — ABNORMAL LOW (ref 39.0–52.0)
Hemoglobin: 9.9 g/dL — ABNORMAL LOW (ref 13.0–17.0)
Potassium: 4 mEq/L (ref 3.5–5.1)
Sodium: 143 mEq/L (ref 135–145)
TCO2: 22 mmol/L (ref 0–100)

## 2011-06-07 LAB — MAGNESIUM
Magnesium: 2.5 mg/dL (ref 1.5–2.5)
Magnesium: 2.4 mg/dL (ref 1.5–2.5)

## 2011-06-07 MED ORDER — HALOPERIDOL LACTATE 5 MG/ML IJ SOLN
2.0000 mg | Freq: Once | INTRAMUSCULAR | Status: AC
  Administered 2011-06-07: 2 mg via INTRAVENOUS

## 2011-06-07 MED ORDER — HALOPERIDOL LACTATE 5 MG/ML IJ SOLN
1.0000 mg | Freq: Four times a day (QID) | INTRAMUSCULAR | Status: DC | PRN
  Administered 2011-06-07 – 2011-06-08 (×4): 1 mg via INTRAVENOUS
  Filled 2011-06-07 (×4): qty 1

## 2011-06-07 MED ORDER — INSULIN ASPART 100 UNIT/ML ~~LOC~~ SOLN
0.0000 [IU] | SUBCUTANEOUS | Status: DC
  Administered 2011-06-07: 4 [IU] via SUBCUTANEOUS
  Administered 2011-06-08 – 2011-06-09 (×6): 2 [IU] via SUBCUTANEOUS
  Administered 2011-06-09: 5 [IU] via SUBCUTANEOUS
  Administered 2011-06-09: 4 [IU] via SUBCUTANEOUS
  Administered 2011-06-10 – 2011-06-11 (×3): 2 [IU] via SUBCUTANEOUS
  Administered 2011-06-11: 4 [IU] via SUBCUTANEOUS
  Administered 2011-06-11: 2 [IU] via SUBCUTANEOUS

## 2011-06-07 MED ORDER — FUROSEMIDE 10 MG/ML IJ SOLN
40.0000 mg | Freq: Once | INTRAMUSCULAR | Status: AC
  Administered 2011-06-07: 40 mg via INTRAVENOUS
  Filled 2011-06-07: qty 4

## 2011-06-07 NOTE — Consult Note (Signed)
Urology Consult  Referring physician: Tyrone Sage Reason for referral: Gross hematuria/ foley not draining.  History of Present Illness:     Troy Gregory and a long-standing patient of Dr. Cornelious Bryant. Seen in our office for elevated PSA as well as BPH in the past. He was last evaluated in March of 2012 in his situation was stable at that time. He is now status post cardiac surgery. This to whether initial Foley catheter insertion was somewhat traumatic. This afternoon D2 significant gross hematuria with bleeding around the penis and poorly draining Foley catheter. The patient has been moderately uncomfortable. Works no voiding problems prior to his cardiac surgery. 14 French indwelling Foley catheter does not appear to be draining at this time.  Past Medical History  Diagnosis Date  . Hypertension 12/2010    was initially orthostatic, but now was back to being hypotensive  . Campath-induced atrial fibrillation     with a stable rate, on no medications  . Syncope     Near-syncope, etiology is deemed secondary to the excessive heat  . Benign prostatic hypertrophy     but stable  . Chronic anticoagulation   . Coronary artery disease   . Mitral regurgitation   . Shortness of breath   . Chronic kidney disease 12/2010    with a creatinine on discharge of 1.84, was  2.1 on admission  . Calculus of kidney   . H/O hiatal hernia    Past Surgical History  Procedure Date  . Circumcision 08/12/2002    because of phimosis  . Coronary artery bypass graft 06/06/2011    Procedure: CORONARY ARTERY BYPASS GRAFTING (CABG);  Surgeon: [name], MD;  Location: [facility] OR;  Service: Open Heart Surgery;  Laterality: N/A;  grafts times three using left internal mammary artery and right leg greater saphenous vein harvested endoscopically  . Maze 06/06/2011    Procedure: MAZE;  Surgeon: [name], MD;  Location: [facility] OR;  Service: Open Heart Surgery;  Laterality: N/A;  . Mitral valve repair 06/06/2011   Procedure: MITRAL VALVE REPAIR (MVR);  Surgeon: [name], MD;  Location: [facility] OR;  Service: Open Heart Surgery;  Laterality: N/A;    Medications:  I have reviewed the patient's current medications. Prior to Admission:  Prescriptions prior to admission  Medication Sig Dispense Refill  . atorvastatin (LIPITOR) 40 MG tablet Take 40 mg by mouth every morning.        . dutasteride (AVODART) 0.5 MG capsule Take 0.5 mg by mouth daily.       . furosemide (LASIX) 80 MG tablet Take 40 mg by mouth every morning.       Marland Kitchen lisinopril (PRINIVIL,ZESTRIL) 20 MG tablet Take 20 mg by mouth every morning.       . warfarin (COUMADIN) 2 MG tablet Take 4-6 mg by mouth every evening. Take 4mg  daily, then on day 5 take 6mg .      . nitroGLYCERIN (NITROSTAT) 0.4 MG SL tablet Place 0.4 mg under the tongue every 5 (five) minutes as needed. FOR CHEST PAIN         Allergies: No Known Allergies  Family History  Problem Relation Age of Onset  . Cancer Father   . Lung cancer Father   . Diabetes Father   . Hypertension Mother   . Hypertension Brother     Social History:  reports that he has quit smoking. He does not have any smokeless tobacco history on file. He reports that he does not  drink alcohol or use illicit drugs.  No pre-op voiding issues. Moderate urge to void. Post-op pain. Otherwise negative  Physical Exam:  Vital signs in last 24 hours: Temp:  [94.3 F (34.6 C)-100.8 F (38.2 C)] 98.6 F (37 C) (01/03 0800) Pulse Rate:  [36-93] 90  (01/03 1200) Resp:  [0-20] 15  (01/03 1200) BP: (82-156)/(49-79) 126/64 mmHg (01/03 1200) SpO2:  [92 %-100 %] 98 % (01/03 1200) Arterial Line BP: (82-146)/(45-79) 109/49 mmHg (01/03 1200) FiO2 (%):  [39.5 %-50.5 %] 39.9 % (01/03 0020) Weight:  [78.5 kg (173 lb 1 oz)-83.4 kg (183 lb 13.8 oz)] 183 lb 13.8 oz (83.4 kg) (01/03 0500) WD WN male in mild/ moderate distress. Resp: Nl effort Abd:  Troy Gregory is an 76 y.o. male.       Results for orders  placed during the hospital encounter of 06/06/11 (from the past 48 hour(s))  POCT I-STAT 4, (NA,K, GLUC, HGB,HCT)     Status: Abnormal   Collection Time   06/06/11  9:04 AM      Component Value Range Comment   Sodium 142  135 - 145 (mEq/L)    Potassium 3.7  3.5 - 5.1 (mEq/L)    Glucose, Bld 120 (*) 70 - 99 (mg/dL)    HCT 16.1  09.6 - 04.5 (%)    Hemoglobin 13.3  13.0 - 17.0 (g/dL)   POCT I-STAT 4, (NA,K, GLUC, HGB,HCT)     Status: Abnormal   Collection Time   06/06/11 10:24 AM      Component Value Range Comment   Sodium 141  135 - 145 (mEq/L)    Potassium 3.8  3.5 - 5.1 (mEq/L)    Glucose, Bld 134 (*) 70 - 99 (mg/dL)    HCT 40.9 (*) 81.1 - 52.0 (%)    Hemoglobin 12.9 (*) 13.0 - 17.0 (g/dL)   POCT I-STAT 4, (NA,K, GLUC, HGB,HCT)     Status: Abnormal   Collection Time   06/06/11 11:25 AM      Component Value Range Comment   Sodium 139  135 - 145 (mEq/L)    Potassium 3.6  3.5 - 5.1 (mEq/L)    Glucose, Bld 114 (*) 70 - 99 (mg/dL)    HCT 91.4 (*) 78.2 - 52.0 (%)    Hemoglobin 8.8 (*) 13.0 - 17.0 (g/dL)   POCT I-STAT 3, BLOOD GAS (G3+)     Status: Abnormal   Collection Time   06/06/11 11:28 AM      Component Value Range Comment   pH, Arterial 7.476 (*) 7.350 - 7.450     pCO2 arterial 35.0  35.0 - 45.0 (mmHg)    pO2, Arterial 287.0 (*) 80.0 - 100.0 (mmHg)    Bicarbonate 25.8 (*) 20.0 - 24.0 (mEq/L)    TCO2 27  0 - 100 (mmol/L)    O2 Saturation 100.0      Acid-Base Excess 2.0  0.0 - 2.0 (mmol/L)    Sample type ARTERIAL     POCT I-STAT GLUCOSE     Status: Abnormal   Collection Time   06/06/11 12:04 PM      Component Value Range Comment   Operator id [id number]      Glucose, Bld 150 (*) 70 - 99 (mg/dL)   POCT I-STAT GLUCOSE     Status: Abnormal   Collection Time   06/06/11  1:34 PM      Component Value Range Comment   Operator id [id number]      Glucose,  Bld 179 (*) 70 - 99 (mg/dL)   PLATELET COUNT     Status: Abnormal   Collection Time   06/06/11  2:04 PM      Component Value Range Comment    Platelets 100 (*) 150 - 400 (K/uL) PLATELET COUNT CONFIRMED BY SMEAR  HEMOGLOBIN AND HEMATOCRIT, BLOOD     Status: Abnormal   Collection Time   06/06/11  2:04 PM      Component Value Range Comment   Hemoglobin 10.2 (*) 13.0 - 17.0 (g/dL)    HCT 16.1 (*) 09.6 - 52.0 (%)   POCT I-STAT 4, (NA,K, GLUC, HGB,HCT)     Status: Abnormal   Collection Time   06/06/11  2:07 PM      Component Value Range Comment   Sodium 142  135 - 145 (mEq/L)    Potassium 4.0  3.5 - 5.1 (mEq/L)    Glucose, Bld 169 (*) 70 - 99 (mg/dL)    HCT 04.5 (*) 40.9 - 52.0 (%)    Hemoglobin 10.2 (*) 13.0 - 17.0 (g/dL)   POCT I-STAT 4, (NA,K, GLUC, HGB,HCT)     Status: Abnormal   Collection Time   06/06/11  3:10 PM      Component Value Range Comment   Sodium 139  135 - 145 (mEq/L)    Potassium 3.6  3.5 - 5.1 (mEq/L)    Glucose, Bld 127 (*) 70 - 99 (mg/dL)    HCT 81.1 (*) 91.4 - 52.0 (%)    Hemoglobin 8.8 (*) 13.0 - 17.0 (g/dL)   CBC     Status: Abnormal   Collection Time   06/06/11  4:30 PM      Component Value Range Comment   WBC 12.1 (*) 4.0 - 10.5 (K/uL)    RBC 3.97 (*) 4.22 - 5.81 (MIL/uL)    Hemoglobin 12.0 (*) 13.0 - 17.0 (g/dL) DELTA CHECK NOTED   HCT 35.3 (*) 39.0 - 52.0 (%)    MCV 88.9  78.0 - 100.0 (fL)    MCH 30.2  26.0 - 34.0 (pg)    MCHC 34.0  30.0 - 36.0 (g/dL)    RDW 78.2  95.6 - 21.3 (%)    Platelets 76 (*) 150 - 400 (K/uL)   PROTIME-INR     Status: Abnormal   Collection Time   06/06/11  4:30 PM      Component Value Range Comment   Prothrombin Time 20.6 (*) 11.6 - 15.2 (seconds)    INR 1.73 (*) 0.00 - 1.49    APTT     Status: Normal   Collection Time   06/06/11  4:30 PM      Component Value Range Comment   aPTT 33  24 - 37 (seconds)   POCT I-STAT 4, (NA,K, GLUC, HGB,HCT)     Status: Abnormal   Collection Time   06/06/11  4:57 PM      Component Value Range Comment   Sodium 142  135 - 145 (mEq/L)    Potassium 3.4 (*) 3.5 - 5.1 (mEq/L)    Glucose, Bld 82  70 - 99 (mg/dL)    HCT 08.6 (*) 57.8 - 52.0 (%)     Hemoglobin 11.6 (*) 13.0 - 17.0 (g/dL)   POCT I-STAT 3, BLOOD GAS (G3+)     Status: Abnormal   Collection Time   06/06/11  5:00 PM      Component Value Range Comment   pH, Arterial 7.518 (*) 7.350 - 7.450  pCO2 arterial 24.5 (*) 35.0 - 45.0 (mmHg)    pO2, Arterial 50.0 (*) 80.0 - 100.0 (mmHg)    Bicarbonate 20.4  20.0 - 24.0 (mEq/L)    TCO2 21  0 - 100 (mmol/L)    O2 Saturation 92.0      Acid-base deficit 2.0  0.0 - 2.0 (mmol/L)    Patient temperature 34.6 C      Collection site ARTERIAL LINE      Drawn by Operator      Sample type ARTERIAL     GLUCOSE, CAPILLARY     Status: Normal   Collection Time   06/06/11  6:01 PM      Component Value Range Comment   Glucose-Capillary 93  70 - 99 (mg/dL)   GLUCOSE, CAPILLARY     Status: Abnormal   Collection Time   06/06/11  6:53 PM      Component Value Range Comment   Glucose-Capillary 103 (*) 70 - 99 (mg/dL)   GLUCOSE, CAPILLARY     Status: Abnormal   Collection Time   06/06/11  8:01 PM      Component Value Range Comment   Glucose-Capillary 106 (*) 70 - 99 (mg/dL)   GLUCOSE, CAPILLARY     Status: Normal   Collection Time   06/06/11  9:06 PM      Component Value Range Comment   Glucose-Capillary 79  70 - 99 (mg/dL)   GLUCOSE, CAPILLARY     Status: Normal   Collection Time   06/06/11 10:28 PM      Component Value Range Comment   Glucose-Capillary 84  70 - 99 (mg/dL)   CBC     Status: Abnormal   Collection Time   06/06/11 10:45 PM      Component Value Range Comment   WBC 14.1 (*) 4.0 - 10.5 (K/uL)    RBC 3.47 (*) 4.22 - 5.81 (MIL/uL)    Hemoglobin 10.7 (*) 13.0 - 17.0 (g/dL)    HCT 16.1 (*) 09.6 - 52.0 (%)    MCV 89.3  78.0 - 100.0 (fL)    MCH 30.8  26.0 - 34.0 (pg)    MCHC 34.5  30.0 - 36.0 (g/dL)    RDW 04.5  40.9 - 81.1 (%)    Platelets 85 (*) 150 - 400 (K/uL) CONSISTENT WITH PREVIOUS RESULT  MAGNESIUM     Status: Abnormal   Collection Time   06/06/11 10:45 PM      Component Value Range Comment   Magnesium 2.8 (*) 1.5 - 2.5  (mg/dL)   CREATININE, SERUM     Status: Abnormal   Collection Time   06/06/11 10:45 PM      Component Value Range Comment   Creatinine, Ser 1.33  0.50 - 1.35 (mg/dL)    GFR calc non Af Amer 51 (*) >90 (mL/min)    GFR calc Af Amer 60 (*) >90 (mL/min)   POCT I-STAT, CHEM 8     Status: Abnormal   Collection Time   06/06/11 10:47 PM      Component Value Range Comment   Sodium 142  135 - 145 (mEq/L)    Potassium 4.4  3.5 - 5.1 (mEq/L)    Chloride 111  96 - 112 (mEq/L)    BUN 18  6 - 23 (mg/dL)    Creatinine, Ser 9.14  0.50 - 1.35 (mg/dL)    Glucose, Bld 782 (*) 70 - 99 (mg/dL)    Calcium, Ion 9.56  1.12 - 1.32 (mmol/L)  TCO2 22  0 - 100 (mmol/L)    Hemoglobin 10.2 (*) 13.0 - 17.0 (g/dL)    HCT 62.1 (*) 30.8 - 52.0 (%)   POCT I-STAT 3, BLOOD GAS (G3+)     Status: Abnormal   Collection Time   06/07/11 12:02 AM      Component Value Range Comment   pH, Arterial 7.359  7.350 - 7.450     pCO2 arterial 39.7  35.0 - 45.0 (mmHg)    pO2, Arterial 119.0 (*) 80.0 - 100.0 (mmHg)    Bicarbonate 22.1  20.0 - 24.0 (mEq/L)    TCO2 23  0 - 100 (mmol/L)    O2 Saturation 98.0      Acid-base deficit 3.0 (*) 0.0 - 2.0 (mmol/L)    Patient temperature 38.2 C      Collection site RADIAL, ALLEN'S TEST ACCEPTABLE      Drawn by Nurse      Sample type ARTERIAL     GLUCOSE, CAPILLARY     Status: Abnormal   Collection Time   06/07/11 12:25 AM      Component Value Range Comment   Glucose-Capillary 144 (*) 70 - 99 (mg/dL)   POCT I-STAT 3, BLOOD GAS (G3+)     Status: Abnormal   Collection Time   06/07/11  1:20 AM      Component Value Range Comment   pH, Arterial 7.354  7.350 - 7.450     pCO2 arterial 39.7  35.0 - 45.0 (mmHg)    pO2, Arterial 83.0  80.0 - 100.0 (mmHg)    Bicarbonate 21.9  20.0 - 24.0 (mEq/L)    TCO2 23  0 - 100 (mmol/L)    O2 Saturation 95.0      Acid-base deficit 3.0 (*) 0.0 - 2.0 (mmol/L)    Patient temperature 37.9 C      Collection site RADIAL, ALLEN'S TEST ACCEPTABLE      Drawn by Nurse       Sample type ARTERIAL     GLUCOSE, CAPILLARY     Status: Abnormal   Collection Time   06/07/11  2:00 AM      Component Value Range Comment   Glucose-Capillary 154 (*) 70 - 99 (mg/dL)   GLUCOSE, CAPILLARY     Status: Abnormal   Collection Time   06/07/11  3:17 AM      Component Value Range Comment   Glucose-Capillary 180 (*) 70 - 99 (mg/dL)   CBC     Status: Abnormal   Collection Time   06/07/11  5:00 AM      Component Value Range Comment   WBC 18.1 (*) 4.0 - 10.5 (K/uL)    RBC 3.40 (*) 4.22 - 5.81 (MIL/uL)    Hemoglobin 10.4 (*) 13.0 - 17.0 (g/dL)    HCT 65.7 (*) 84.6 - 52.0 (%)    MCV 90.3  78.0 - 100.0 (fL)    MCH 30.6  26.0 - 34.0 (pg)    MCHC 33.9  30.0 - 36.0 (g/dL)    RDW 96.2  95.2 - 84.1 (%)    Platelets 85 (*) 150 - 400 (K/uL) CONSISTENT WITH PREVIOUS RESULT  BASIC METABOLIC PANEL     Status: Abnormal   Collection Time   06/07/11  5:00 AM      Component Value Range Comment   Sodium 141  135 - 145 (mEq/L)    Potassium 4.0  3.5 - 5.1 (mEq/L)    Chloride 111  96 - 112 (mEq/L)  CO2 22  19 - 32 (mEq/L)    Glucose, Bld 174 (*) 70 - 99 (mg/dL)    BUN 19  6 - 23 (mg/dL)    Creatinine, Ser 1.61 (*) 0.50 - 1.35 (mg/dL)    Calcium 9.0  8.4 - 10.5 (mg/dL)    GFR calc non Af Amer 43 (*) >90 (mL/min)    GFR calc Af Amer 50 (*) >90 (mL/min)   MAGNESIUM     Status: Normal   Collection Time   06/07/11  5:00 AM      Component Value Range Comment   Magnesium 2.5  1.5 - 2.5 (mg/dL)   GLUCOSE, CAPILLARY     Status: Abnormal   Collection Time   06/07/11  7:33 AM      Component Value Range Comment   Glucose-Capillary 133 (*) 70 - 99 (mg/dL)    Dg Chest Portable 1 View In Am  06/07/2011  *RADIOLOGY REPORT*  Clinical Data: Postop open heart surgery  PORTABLE CHEST - 1 VIEW  Comparison: Portable chest x-ray of 06/06/2011  Findings: The endotracheal tube has been removed.  Left chest tube and Swan-Ganz catheter remain.  The lungs are not quite as well aerated with basilar atelectasis noted.   Cardiomegaly is stable. No pneumothorax is seen.  IMPRESSION: Endotracheal tube removed.  Persistent basilar opacities most consistent with atelectasis, left greater than right.  Original Report Authenticated By: Juline Patch, M.D.   Dg Chest Portable 1 View  06/06/2011  *RADIOLOGY REPORT*  Clinical Data: Postop CABG  PORTABLE CHEST - 1 VIEW  Comparison: 06/04/2011  Findings: There has been median sternotomy, CABG and mitral valve replacement.  Endotracheal tube has its tip 3.5 cm above the carina.  Nasogastric tube enters the stomach.  Swan-Ganz catheter has its tip in the main pulmonary artery.  Left chest tube is in place.  There is no pneumothorax.  There is basilar atelectasis left worse than right.  No pulmonary edema.  IMPRESSION: Lines and tubes well positioned.  Lower lobe atelectasis left worse than right.  No pneumothorax or edema.  Original Report Authenticated By: Thomasenia Sales, M.D.    Review of Systems - Negative except gross hematuria/ post-op pain/urgency and SP pressure.  Blood pressure 126/64, pulse 90, temperature 98.6 F (37 C), temperature source Core (Comment), resp. rate 15, height 6' (1.829 m), weight 83.4 kg (183 lb 13.8 oz), SpO2 98.00%. General appearance: alert, cooperative and no distress Neck: no adenopathy and no JVD Resp: clear to auscultation bilaterally Cardio: regular rate and rhythm GI: soft, non-tender; bowel sounds normal; no masses,  no organomegaly Male genitalia: normal, penis: no lesions or discharge. testes: no masses or tenderness. no hernias Pelvic: external genitalial shows indwelling foley with bleeding around catheter. Extremities: extremities normal, atraumatic, no cyanosis or edema Skin: Skin color, texture, turgor normal. No rashes or lesions Neurologic: Grossly normal    Laboratory Data:  Results for orders placed during the hospital encounter of 06/06/11 (from the past 72 hour(s))  POCT I-STAT 4, (NA,K, GLUC, HGB,HCT)     Status: Abnormal    Collection Time   06/06/11  9:04 AM      Component Value Range Comment   Sodium 142  135 - 145 (mEq/L)    Potassium 3.7  3.5 - 5.1 (mEq/L)    Glucose, Bld 120 (*) 70 - 99 (mg/dL)    HCT 09.6  04.5 - 40.9 (%)    Hemoglobin 13.3  13.0 - 17.0 (g/dL)   POCT  I-STAT 4, (NA,K, GLUC, HGB,HCT)     Status: Abnormal   Collection Time   06/06/11 10:24 AM      Component Value Range Comment   Sodium 141  135 - 145 (mEq/L)    Potassium 3.8  3.5 - 5.1 (mEq/L)    Glucose, Bld 134 (*) 70 - 99 (mg/dL)    HCT 16.1 (*) 09.6 - 52.0 (%)    Hemoglobin 12.9 (*) 13.0 - 17.0 (g/dL)   POCT I-STAT 4, (NA,K, GLUC, HGB,HCT)     Status: Abnormal   Collection Time   06/06/11 11:25 AM      Component Value Range Comment   Sodium 139  135 - 145 (mEq/L)    Potassium 3.6  3.5 - 5.1 (mEq/L)    Glucose, Bld 114 (*) 70 - 99 (mg/dL)    HCT 04.5 (*) 40.9 - 52.0 (%)    Hemoglobin 8.8 (*) 13.0 - 17.0 (g/dL)   POCT I-STAT 3, BLOOD GAS (G3+)     Status: Abnormal   Collection Time   06/06/11 11:28 AM      Component Value Range Comment   pH, Arterial 7.476 (*) 7.350 - 7.450     pCO2 arterial 35.0  35.0 - 45.0 (mmHg)    pO2, Arterial 287.0 (*) 80.0 - 100.0 (mmHg)    Bicarbonate 25.8 (*) 20.0 - 24.0 (mEq/L)    TCO2 27  0 - 100 (mmol/L)    O2 Saturation 100.0      Acid-Base Excess 2.0  0.0 - 2.0 (mmol/L)    Sample type ARTERIAL     POCT I-STAT GLUCOSE     Status: Abnormal   Collection Time   06/06/11 12:04 PM      Component Value Range Comment   Operator id [id number]      Glucose, Bld 150 (*) 70 - 99 (mg/dL)   POCT I-STAT GLUCOSE     Status: Abnormal   Collection Time   06/06/11  1:34 PM      Component Value Range Comment   Operator id [id number]      Glucose, Bld 179 (*) 70 - 99 (mg/dL)   PLATELET COUNT     Status: Abnormal   Collection Time   06/06/11  2:04 PM      Component Value Range Comment   Platelets 100 (*) 150 - 400 (K/uL) PLATELET COUNT CONFIRMED BY SMEAR  HEMOGLOBIN AND HEMATOCRIT, BLOOD     Status: Abnormal   Collection  Time   06/06/11  2:04 PM      Component Value Range Comment   Hemoglobin 10.2 (*) 13.0 - 17.0 (g/dL)    HCT 81.1 (*) 91.4 - 52.0 (%)   POCT I-STAT 4, (NA,K, GLUC, HGB,HCT)     Status: Abnormal   Collection Time   06/06/11  2:07 PM      Component Value Range Comment   Sodium 142  135 - 145 (mEq/L)    Potassium 4.0  3.5 - 5.1 (mEq/L)    Glucose, Bld 169 (*) 70 - 99 (mg/dL)    HCT 78.2 (*) 95.6 - 52.0 (%)    Hemoglobin 10.2 (*) 13.0 - 17.0 (g/dL)   POCT I-STAT 4, (NA,K, GLUC, HGB,HCT)     Status: Abnormal   Collection Time   06/06/11  3:10 PM      Component Value Range Comment   Sodium 139  135 - 145 (mEq/L)    Potassium 3.6  3.5 - 5.1 (mEq/L)    Glucose, Bld  127 (*) 70 - 99 (mg/dL)    HCT 16.1 (*) 09.6 - 52.0 (%)    Hemoglobin 8.8 (*) 13.0 - 17.0 (g/dL)   CBC     Status: Abnormal   Collection Time   06/06/11  4:30 PM      Component Value Range Comment   WBC 12.1 (*) 4.0 - 10.5 (K/uL)    RBC 3.97 (*) 4.22 - 5.81 (MIL/uL)    Hemoglobin 12.0 (*) 13.0 - 17.0 (g/dL) DELTA CHECK NOTED   HCT 35.3 (*) 39.0 - 52.0 (%)    MCV 88.9  78.0 - 100.0 (fL)    MCH 30.2  26.0 - 34.0 (pg)    MCHC 34.0  30.0 - 36.0 (g/dL)    RDW 04.5  40.9 - 81.1 (%)    Platelets 76 (*) 150 - 400 (K/uL)   PROTIME-INR     Status: Abnormal   Collection Time   06/06/11  4:30 PM      Component Value Range Comment   Prothrombin Time 20.6 (*) 11.6 - 15.2 (seconds)    INR 1.73 (*) 0.00 - 1.49    APTT     Status: Normal   Collection Time   06/06/11  4:30 PM      Component Value Range Comment   aPTT 33  24 - 37 (seconds)   POCT I-STAT 4, (NA,K, GLUC, HGB,HCT)     Status: Abnormal   Collection Time   06/06/11  4:57 PM      Component Value Range Comment   Sodium 142  135 - 145 (mEq/L)    Potassium 3.4 (*) 3.5 - 5.1 (mEq/L)    Glucose, Bld 82  70 - 99 (mg/dL)    HCT 91.4 (*) 78.2 - 52.0 (%)    Hemoglobin 11.6 (*) 13.0 - 17.0 (g/dL)   POCT I-STAT 3, BLOOD GAS (G3+)     Status: Abnormal   Collection Time   06/06/11  5:00 PM       Component Value Range Comment   pH, Arterial 7.518 (*) 7.350 - 7.450     pCO2 arterial 24.5 (*) 35.0 - 45.0 (mmHg)    pO2, Arterial 50.0 (*) 80.0 - 100.0 (mmHg)    Bicarbonate 20.4  20.0 - 24.0 (mEq/L)    TCO2 21  0 - 100 (mmol/L)    O2 Saturation 92.0      Acid-base deficit 2.0  0.0 - 2.0 (mmol/L)    Patient temperature 34.6 C      Collection site ARTERIAL LINE      Drawn by Operator      Sample type ARTERIAL     GLUCOSE, CAPILLARY     Status: Normal   Collection Time   06/06/11  6:01 PM      Component Value Range Comment   Glucose-Capillary 93  70 - 99 (mg/dL)   GLUCOSE, CAPILLARY     Status: Abnormal   Collection Time   06/06/11  6:53 PM      Component Value Range Comment   Glucose-Capillary 103 (*) 70 - 99 (mg/dL)   GLUCOSE, CAPILLARY     Status: Abnormal   Collection Time   06/06/11  8:01 PM      Component Value Range Comment   Glucose-Capillary 106 (*) 70 - 99 (mg/dL)   GLUCOSE, CAPILLARY     Status: Normal   Collection Time   06/06/11  9:06 PM      Component Value Range Comment   Glucose-Capillary 79  70 - 99 (mg/dL)   GLUCOSE, CAPILLARY     Status: Normal   Collection Time   06/06/11 10:28 PM      Component Value Range Comment   Glucose-Capillary 84  70 - 99 (mg/dL)   CBC     Status: Abnormal   Collection Time   06/06/11 10:45 PM      Component Value Range Comment   WBC 14.1 (*) 4.0 - 10.5 (K/uL)    RBC 3.47 (*) 4.22 - 5.81 (MIL/uL)    Hemoglobin 10.7 (*) 13.0 - 17.0 (g/dL)    HCT 54.0 (*) 98.1 - 52.0 (%)    MCV 89.3  78.0 - 100.0 (fL)    MCH 30.8  26.0 - 34.0 (pg)    MCHC 34.5  30.0 - 36.0 (g/dL)    RDW 19.1  47.8 - 29.5 (%)    Platelets 85 (*) 150 - 400 (K/uL) CONSISTENT WITH PREVIOUS RESULT  MAGNESIUM     Status: Abnormal   Collection Time   06/06/11 10:45 PM      Component Value Range Comment   Magnesium 2.8 (*) 1.5 - 2.5 (mg/dL)   CREATININE, SERUM     Status: Abnormal   Collection Time   06/06/11 10:45 PM      Component Value Range Comment   Creatinine, Ser  1.33  0.50 - 1.35 (mg/dL)    GFR calc non Af Amer 51 (*) >90 (mL/min)    GFR calc Af Amer 60 (*) >90 (mL/min)   POCT I-STAT, CHEM 8     Status: Abnormal   Collection Time   06/06/11 10:47 PM      Component Value Range Comment   Sodium 142  135 - 145 (mEq/L)    Potassium 4.4  3.5 - 5.1 (mEq/L)    Chloride 111  96 - 112 (mEq/L)    BUN 18  6 - 23 (mg/dL)    Creatinine, Ser 6.21  0.50 - 1.35 (mg/dL)    Glucose, Bld 308 (*) 70 - 99 (mg/dL)    Calcium, Ion 6.57  1.12 - 1.32 (mmol/L)    TCO2 22  0 - 100 (mmol/L)    Hemoglobin 10.2 (*) 13.0 - 17.0 (g/dL)    HCT 84.6 (*) 96.2 - 52.0 (%)   POCT I-STAT 3, BLOOD GAS (G3+)     Status: Abnormal   Collection Time   06/07/11 12:02 AM      Component Value Range Comment   pH, Arterial 7.359  7.350 - 7.450     pCO2 arterial 39.7  35.0 - 45.0 (mmHg)    pO2, Arterial 119.0 (*) 80.0 - 100.0 (mmHg)    Bicarbonate 22.1  20.0 - 24.0 (mEq/L)    TCO2 23  0 - 100 (mmol/L)    O2 Saturation 98.0      Acid-base deficit 3.0 (*) 0.0 - 2.0 (mmol/L)    Patient temperature 38.2 C      Collection site RADIAL, ALLEN'S TEST ACCEPTABLE      Drawn by Nurse      Sample type ARTERIAL     GLUCOSE, CAPILLARY     Status: Abnormal   Collection Time   06/07/11 12:25 AM      Component Value Range Comment   Glucose-Capillary 144 (*) 70 - 99 (mg/dL)   POCT I-STAT 3, BLOOD GAS (G3+)     Status: Abnormal   Collection Time   06/07/11  1:20 AM      Component Value Range Comment  pH, Arterial 7.354  7.350 - 7.450     pCO2 arterial 39.7  35.0 - 45.0 (mmHg)    pO2, Arterial 83.0  80.0 - 100.0 (mmHg)    Bicarbonate 21.9  20.0 - 24.0 (mEq/L)    TCO2 23  0 - 100 (mmol/L)    O2 Saturation 95.0      Acid-base deficit 3.0 (*) 0.0 - 2.0 (mmol/L)    Patient temperature 37.9 C      Collection site RADIAL, ALLEN'S TEST ACCEPTABLE      Drawn by Nurse      Sample type ARTERIAL     GLUCOSE, CAPILLARY     Status: Abnormal   Collection Time   06/07/11  2:00 AM      Component Value Range  Comment   Glucose-Capillary 154 (*) 70 - 99 (mg/dL)   GLUCOSE, CAPILLARY     Status: Abnormal   Collection Time   06/07/11  3:17 AM      Component Value Range Comment   Glucose-Capillary 180 (*) 70 - 99 (mg/dL)   CBC     Status: Abnormal   Collection Time   06/07/11  5:00 AM      Component Value Range Comment   WBC 18.1 (*) 4.0 - 10.5 (K/uL)    RBC 3.40 (*) 4.22 - 5.81 (MIL/uL)    Hemoglobin 10.4 (*) 13.0 - 17.0 (g/dL)    HCT 98.1 (*) 19.1 - 52.0 (%)    MCV 90.3  78.0 - 100.0 (fL)    MCH 30.6  26.0 - 34.0 (pg)    MCHC 33.9  30.0 - 36.0 (g/dL)    RDW 47.8  29.5 - 62.1 (%)    Platelets 85 (*) 150 - 400 (K/uL) CONSISTENT WITH PREVIOUS RESULT  BASIC METABOLIC PANEL     Status: Abnormal   Collection Time   06/07/11  5:00 AM      Component Value Range Comment   Sodium 141  135 - 145 (mEq/L)    Potassium 4.0  3.5 - 5.1 (mEq/L)    Chloride 111  96 - 112 (mEq/L)    CO2 22  19 - 32 (mEq/L)    Glucose, Bld 174 (*) 70 - 99 (mg/dL)    BUN 19  6 - 23 (mg/dL)    Creatinine, Ser 3.08 (*) 0.50 - 1.35 (mg/dL)    Calcium 9.0  8.4 - 10.5 (mg/dL)    GFR calc non Af Amer 43 (*) >90 (mL/min)    GFR calc Af Amer 50 (*) >90 (mL/min)   MAGNESIUM     Status: Normal   Collection Time   06/07/11  5:00 AM      Component Value Range Comment   Magnesium 2.5  1.5 - 2.5 (mg/dL)   GLUCOSE, CAPILLARY     Status: Abnormal   Collection Time   06/07/11  7:33 AM      Component Value Range Comment   Glucose-Capillary 133 (*) 70 - 99 (mg/dL)    Recent Results (from the past 240 hour(s))  SURGICAL PCR SCREEN     Status: Normal   Collection Time   06/04/11  8:38 AM      Component Value Range Status Comment   MRSA, PCR NEGATIVE  NEGATIVE  Final    Staphylococcus aureus NEGATIVE  NEGATIVE  Final    Creatinine:  Basename 06/07/11 0500 06/06/11 2247 06/06/11 2245 06/04/11 0857  CREATININE 1.53* 1.30 1.33 1.60*   Baseline Creatinine:   Impression/Assessment:  Gross hematuria  bleed secondary to catheter trauma  concurrent BPH. His 14  French Foley catheter was removed. He [place] Foley catheter was inserted. The bladder was irrigated with saline obtaining 100-200 cc of old clots. The urine was medium red in color and the Foley drained well after reinsertion. At this point I would leave the indwelling Foley catheter for these 48 hours. The nursing staff is instructed to irrigate the catheter with saline on a when necessary basis.  Plan: As above  Ondra Deboard S 06/07/2011, 12:38 PM

## 2011-06-07 NOTE — Anesthesia Postprocedure Evaluation (Signed)
  Anesthesia Post-op Note  Patient: Troy Gregory  Procedure(s) Performed:  CORONARY ARTERY BYPASS GRAFTING (CABG) - grafts times three using left internal mammary artery and right leg greater saphenous vein harvested endoscopically; MAZE; MITRAL VALVE REPAIR (MVR)  Patient Location: PACU and SICU  Anesthesia Type: General  Level of Consciousness: sedated and unresponsive  Airway and Oxygen Therapy: Patient remains intubated per anesthesia plan  Post-op Pain: none  Post-op Assessment: Post-op Vital signs reviewed and Patient's Cardiovascular Status Stable  Post-op Vital Signs: stable  Complications: No apparent anesthesia complications

## 2011-06-07 NOTE — Progress Notes (Signed)
Patient pulling at catheter all night.  Urine output has changed from clear yellow to amber to dark red as the night has progressed.  Patient complaining that he has to use the bathroom, despite being told that he has a catheter.  Bladder scanner showed max of 148 ml of urine.  Urine output has been 30-400 ml/hr during the night.  Troy Gregory

## 2011-06-07 NOTE — Progress Notes (Signed)
   CARE MANAGEMENT NOTE 06/07/2011  Patient:  Troy Gregory, Troy Gregory   Account Number:  [id number]  Date Initiated:  06/07/2011  Documentation initiated by:  [facility]  Subjective/Objective Assessment:   Admitted to ICU post op CABG x3, MAZE and MVR.  Has spouse.     Action/Plan:   PTA, PT INDEPENDENT, LIVES WITH SPOUSE.   Anticipated DC Date:  06/11/2011   Anticipated DC Plan:  HOME W HOME HEALTH SERVICES      DC Planning Services  CM consult      Choice offered to / List presented to:             Status of service:  In process, will continue to follow Medicare Important Message given?   (If response is "NO", the following Medicare IM given date fields will be blank) Date Medicare IM given:   Date Additional Medicare IM given:    Discharge Disposition:    Per UR Regulation:  Reviewed for med. necessity/level of care/duration of stay  Comments:  06/06/10 Demareon Coldwell,RN,BSN 1609 MET WITH PT AND WIFE TO DISCUSS DC PLANS.  PT QUITE CONFUSED AND AGITATED, IN RESTRAINTS AND WITH SITTER AT BEDSIDE.  WIFE STATES PT HAS "A FEW EPISODES" OF CONFUSION AT HOME WHEN HE HAD TROUBLE BREATHING, BUT NEVER TO THIS EXTENT.  DISCUSSED OPTIONS FOR DISPOSITION...WIFE OPEN TO CONSIDERING SNF FOR REHAB, IF NEEDED, THOUGH WE ARE HOPEFUL THIS CONFUSION WILL CLEAR QUICKLY.  WILL CONT TO FOLLOW PROGRESS. Phone #[phone]   06-07-11 10:53am [name], RNBSN - [phone] UR completed.

## 2011-06-07 NOTE — Progress Notes (Signed)
[name] is confused in four point restraints. He pulled out his foley catheter this morning and clots were irrigated from his bladder by Dr. Isabel Caprice and foley reinserted. He is moving all extremities and has no focal deficits. He is hemodynamically stable on milrinone and dopamine and he remains in sinus rhythm.  VS; T- 36.6 BP- 126/64 HR-86 RR-15 O2 Sat- 98% on 2l.   Na-143 K-4.0 BUN 23 Cr- 1.73 H/H 30.2/10.2 plts- 85,000  Pt extubated 7 hrs. Postop.  Impression:  76 y/o WM s/p CABG, MV Repair, and Maze procedure. Pt. has postop confusion, suspect secondary to meds. Hemodynamically stable, maintaining SR.  Renal function a concern, in light of underlying chronic renal insufficiency. Baseline Cr- 1.6.  Troy Brood, MD

## 2011-06-07 NOTE — Progress Notes (Signed)
Spoke with Dr. Tyrone Sage via phone conversation regarding patient's condition.  Pt remained agitated after administering 1mg  Haldol.  Five nursing staff were required to keep patient in bed while performing nursing care including pulling femoral arterial line and mediastinal tube.  It was also noted that patient is urinating around the foley catheter at the insertion site, even after attempting to irrigate with normal saline.  Bright, red blood clots are noted in the foley catheter tubing.  MD ordered restraints, 2 mg Haldol x 1 now, and will obtain a urology consult.  Foley catheter is to remain in place until evaluated by urologist.  Troy Gregory will also remain at patient's bedside to ensure patient safety.

## 2011-06-07 NOTE — Progress Notes (Signed)
Patient ID: Troy Gregory, male   DOB: 07-28-1935, 75 y.o.   MRN: [id number] BP 143/105  Pulse 89  Temp(Src) 97.7 F (36.5 C) (Oral)  Resp 11  Ht 6' (1.829 m)  Wt 183 lb 13.8 oz (83.4 kg)  BMI 24.94 kg/m2  SpO2 94% Much less confused, follows commands  Urine output now better with new foley  Cr now 1.74 Sinus 80   [name] MD  Beeper [phone]  Office [phone]  06/07/2011 7:13 PM

## 2011-06-07 NOTE — Procedures (Signed)
Extubation Procedure Note  Patient Details:   Name: Troy Gregory DOB: 1935-10-30 MRN: [id number]   Airway Documentation:     Evaluation  O2 sats: stable throughout Complications: No apparent complications Patient did tolerate procedure well. Bilateral Breath Sounds: Clear/Diminished Suctioning: Airway Yes  [name] 06/07/2011, 12:26 AM  Patient was weaned, performed SBT, coached on deep breathing, cough and was extubated to a 4 L nasal cannula.  No evidence of stridor present.  NIF -40 VC 0.8 L

## 2011-06-07 NOTE — Progress Notes (Signed)
Patient ID: Troy Gregory, male   DOB: Dec 23, 1935, 76 y.o.   MRN: [id number] [facility] DAILY PROGRESS NOTE                   [address]            [place] [address]          [phone]      1 Day Post-Op Procedure(s) (LRB): CORONARY ARTERY BYPASS GRAFTING (CABG) (N/A) MAZE (N/A) MITRAL VALVE REPAIR (MVR) (N/A)  Total Length of Stay:  LOS: 1 day   Subjective: Awake but confused to place and time. Follows commands   Objective: Vital signs in last 24 hours: Patient Vitals for the past 24 hrs:  BP Temp Temp src Pulse Resp SpO2 Height Weight  06/07/11 0756 - 98.6 F (37 C) Core - - - - -  06/07/11 0700 125/67 mmHg 98.4 F (36.9 C) Core 90  13  97 % - -  06/07/11 0645 - 98.4 F (36.9 C) - 90  14  98 % - -  06/07/11 0630 - 98.4 F (36.9 C) - 90  14  97 % - -  06/07/11 0615 - 98.4 F (36.9 C) - 90  13  100 % - -  06/07/11 0600 110/69 mmHg 98.4 F (36.9 C) Core 90  13  99 % - -  06/07/11 0545 - 98.4 F (36.9 C) - 90  16  98 % - -  06/07/11 0530 - 98.6 F (37 C) - 90  13  98 % - -  06/07/11 0515 93/58 mmHg 98.8 F (37.1 C) - 90  14  95 % - -  06/07/11 0500 93/58 mmHg 98.8 F (37.1 C) Core 90  14  96 % - 183 lb 13.8 oz (83.4 kg)  06/07/11 0445 - - - - 19  - - -  06/07/11 0430 - 99.1 F (37.3 C) - 90  9  98 % - -  06/07/11 0415 - 99.3 F (37.4 C) - 90  0  97 % - -  06/07/11 0400 96/55 mmHg 99.3 F (37.4 C) Core 90  7  97 % - -  06/07/11 0345 - 99.5 F (37.5 C) - 90  13  99 % - -  06/07/11 0330 - 99.5 F (37.5 C) - 90  14  98 % - -  06/07/11 0315 - 99.7 F (37.6 C) - 90  13  98 % - -  06/07/11 0300 98/64 mmHg 99.7 F (37.6 C) Core 90  19  98 % - -  06/07/11 0245 - 99.9 F (37.7 C) - 90  0  99 % - -  06/07/11 0230 - 99.7 F (37.6 C) - 89  18  98 % - -  06/07/11 0215 - 99.9 F (37.7 C) - 90  15  98 % - -  06/07/11 0200 99/57 mmHg 100 F (37.8 C) Core 90  16  98 % - -  06/07/11 0145 - 100 F (37.8 C) - 90  17  95 % - -  06/07/11 0130 - 100.2 F (37.9  C) - 90  17  98 % - -  06/07/11 0115 - 100.2 F (37.9 C) - 90  16  93 % - -  06/07/11 0100 105/61 mmHg 100.4 F (38 C) Core 90  16  92 % - -  06/07/11 0045 - 100.6 F (38.1 C) - 68  16  92 % - -  06/07/11 0030 - 100.6 F (38.1 C) - 36  15  95 % - -  06/07/11 0020 91/50 mmHg 100.6 F (38.1 C) - 68  14  100 % - -  06/07/11 0015 - 100.6 F (38.1 C) - 89  13  99 % - -  06/07/11 0000 91/50 mmHg 100.8 F (38.2 C) Core 61  13  100 % - -  06/06/11 2345 - 100.8 F (38.2 C) - 66  18  100 % - -  06/06/11 2337 106/49 mmHg 100.8 F (38.2 C) - 72  11  100 % - -  06/06/11 2330 - 100.8 F (38.2 C) - 65  11  100 % - -  06/06/11 2315 - 100.6 F (38.1 C) - 65  20  100 % - -  06/06/11 2312 116/55 mmHg 100.6 F (38.1 C) - 65  16  100 % - -  06/06/11 2300 99/66 mmHg 100.6 F (38.1 C) Core 90  14  100 % - -  06/06/11 2245 - 100.2 F (37.9 C) - 60  14  100 % - -  06/06/11 2230 - 100.2 F (37.9 C) - 78  13  100 % - -  06/06/11 2215 - 100.4 F (38 C) - 79  13  100 % - -  06/06/11 2200 90/57 mmHg 100 F (37.8 C) Core 80  16  100 % - -  06/06/11 2146 106/54 mmHg 99.9 F (37.7 C) - 80  17  100 % - -  06/06/11 2145 - 99.9 F (37.7 C) - 80  17  100 % - -  06/06/11 2130 - 99.3 F (37.4 C) - 80  13  100 % - -  06/06/11 2124 107/53 mmHg 99.1 F (37.3 C) - 80  20  99 % - -  06/06/11 2115 - 98.8 F (37.1 C) - 80  14  100 % - -  06/06/11 2110 90/49 mmHg 98.8 F (37.1 C) - 80  17  100 % - -  06/06/11 2100 82/60 mmHg 98.4 F (36.9 C) Core 80  14  100 % - -  06/06/11 2045 - 98.1 F (36.7 C) - 80  14  100 % - -  06/06/11 2030 - 97.5 F (36.4 C) - 80  13  100 % - -  06/06/11 2015 - 97.2 F (36.2 C) - 80  15  100 % - -  06/06/11 2000 94/66 mmHg 97 F (36.1 C) Core 80  12  100 % - -  06/06/11 1945 - 97 F (36.1 C) - 68  12  100 % - -  06/06/11 1930 - 96.8 F (36 C) - 68  12  100 % - -  06/06/11 1915 - 96.4 F (35.8 C) - 69  12  100 % - -  06/06/11 1900 92/65 mmHg 96.1 F (35.6 C) Core 69  12   100 % - -  06/06/11 1845 - 95.7 F (35.4 C) - 69  12  100 % - -  06/06/11 1830 - 95.5 F (35.3 C) - 72  12  100 % - -  06/06/11 1815 - 95.2 F (35.1 C) - 78  12  100 % - -  06/06/11 1800 97/72 mmHg 94.8 F (34.9 C) Core 78  12  99 % - -  06/06/11 1745 - 94.5 F (34.7 C) - 81  13  100 % - -  06/06/11 1730 - 94.3 F (34.6 C) -  77  12  100 % - -  06/06/11 1715 82/57 mmHg 94.3 F (34.6 C) - 75  12  98 % - -  06/06/11 1700 - 94.3 F (34.6 C) Core 83  13  94 % 6' (1.829 m) 173 lb 1 oz (78.5 kg)  06/06/11 1651 91/54 mmHg - - 86  12  95 % - -   Wt Readings from Last 3 Encounters:  06/07/11 183 lb 13.8 oz (83.4 kg)  06/07/11 183 lb 13.8 oz (83.4 kg)  06/04/11 173 lb 1 oz (78.5 kg)    Hemodynamic parameters for last 24 hours: PAP: (21-43)/(11-20) 28/12 mmHg CO:  [3.3 L/min-7.4 L/min] 7.3 L/min CI:  [1.6 L/min/m2-3.7 L/min/m2] 3.7 L/min/m2  Intake/Output from previous day: 01/02 0701 - 01/03 0700 In: 7179.5 [I.V.:4524.5; Blood:875; NG/GT:30; IV Piggyback:1750] Out: 7720 [Urine:4865; Blood:2160; Chest Tube:695]  Intake/Output this shift:    Current Meds: Scheduled Meds:   . acetaminophen (TYLENOL) oral liquid 160 mg/5 mL  650 mg Per Tube NOW   Or  . acetaminophen  650 mg Rectal NOW  . acetaminophen  1,000 mg Oral Q6H   Or  . acetaminophen (TYLENOL) oral liquid 160 mg/5 mL  975 mg Per Tube Q6H  . aspirin EC  325 mg Oral Daily   Or  . aspirin  324 mg Per Tube Daily  . bisacodyl  10 mg Oral Daily   Or  . bisacodyl  10 mg Rectal Daily  . cefUROXime (ZINACEF)  IV  1.5 g Intravenous To OR  . cefUROXime (ZINACEF)  IV  1.5 g Intravenous Q12H  . docusate sodium  200 mg Oral Daily  . dutasteride  0.5 mg Oral Daily  . famotidine (PEPCID) IV  20 mg Intravenous Q12H  . heparin-papaverine-plasmalyte irrigation   Irrigation To OR  . insulin aspart  0-24 Units Subcutaneous Q2H   Followed by  . insulin aspart  0-24 Units Subcutaneous Q4H  . magnesium sulfate  4 g Intravenous Once  .  metoprolol tartrate  12.5 mg Oral BID   Or  . metoprolol tartrate  12.5 mg Per Tube BID  . pantoprazole  40 mg Oral Q1200  . potassium chloride  10 mEq Intravenous Q1 Hr x 3  . simvastatin  20 mg Oral Daily  . sodium chloride  3 mL Intravenous Q12H  . vancomycin (VANCOCIN) IVPB 1000 mg/100 mL central line  1,000 mg Intravenous Once  . vancomycin  1,250 mg Intravenous To OR  . DISCONTD: aminocaproic acid (AMICAR) for OHS   Intravenous To OR  . DISCONTD: cefUROXime (ZINACEF)  IV  750 mg Intravenous To OR  . DISCONTD: dexmedetomidine (PRECEDEX) IV infusion for high rates  0.1-0.7 mcg/kg/hr Intravenous To OR  . DISCONTD: DOPamine  2-20 mcg/kg/min Intravenous To OR  . DISCONTD: epinephrine  0.5-20 mcg/min Intravenous To OR  . DISCONTD: insulin (NOVOLIN-R) infusion   Intravenous To OR  . DISCONTD: magnesium sulfate  40 mEq Other To OR  . DISCONTD: nitroGLYCERIN  2-200 mcg/min Intravenous To OR  . DISCONTD: phenylephrine (NEO-SYNEPHRINE) Adult infusion  30-200 mcg/min Intravenous To OR  . DISCONTD: potassium chloride  80 mEq Other To OR   Continuous Infusions:   . sodium chloride Stopped (06/06/11 1700)  . sodium chloride    . sodium chloride    . amiodarone (CORDARONE) infusion 30 mg/hr (06/07/11 0700)  . dexmedetomidine (PRECEDEX) IV infusion Stopped (06/06/11 2000)  . DOPamine 3 mcg/kg/min (06/07/11 0700)  . insulin (NOVOLIN-R) infusion 0.6 mL/hr at  06/06/11 2100  . lactated ringers 20 mL/hr at 06/06/11 1800  . milrinone 0.3 mcg/kg/min (06/07/11 0700)  . nitroGLYCERIN Stopped (06/06/11 1645)  . phenylephrine (NEO-SYNEPHRINE) Adult infusion 15 mcg/min (06/07/11 0700)  . DISCONTD: aminocaproic acid (AMICAR) for OHS    . DISCONTD: dexmedetomidine (PRECEDEX) IV infusion for high rates 0.4 mcg/kg/hr (06/06/11 1807)   PRN Meds:.albumin human, lactated ringers, metoprolol, midazolam, morphine injection, morphine, ondansetron (ZOFRAN) IV, oxyCODONE, sodium chloride, DISCONTD: 0.9 % irrigation  (POUR BTL), DISCONTD: hemostatic agents, DISCONTD: papaverine, DISCONTD: Surgifoam 1 Gm with 0.9% sodium chloride (4 ml) topical solution  General appearance: alert and slowed mentation Neurologic: confused, but follows commands and moves all extremities to command Heart: regular rate and rhythm, S1, S2 normal, no murmur, click, rub or gallop Lungs: clear to auscultation bilaterally Abdomen: soft, non-tender; bowel sounds normal; no masses,  no organomegaly Extremities: extremities normal, atraumatic, no cyanosis or edema and Homans sign is negative, no sign of DVT Urine in foley now bloody due to trauma patient pulling on foley  Lab Results: CBC: Basename 06/07/11 0500 06/06/11 2247 06/06/11 2245  WBC 18.1* -- 14.1*  HGB 10.4* 10.2* --  HCT 30.7* 30.0* --  PLT 85* -- 85*   BMET:  Basename 06/07/11 0500 06/06/11 2247 06/04/11 0857  NA 141 142 --  K 4.0 4.4 --  CL 111 111 --  CO2 22 -- 24  GLUCOSE 174* 123* --  BUN 19 18 --  CREATININE 1.53* 1.30 --  CALCIUM 9.0 -- 9.2    PT/INR:  Basename 06/06/11 1630  LABPROT 20.6*  INR 1.73*   Radiology: Dg Chest Portable 1 View  06/06/2011  *RADIOLOGY REPORT*  Clinical Data: Postop CABG  PORTABLE CHEST - 1 VIEW  Comparison: 06/04/2011  Findings: There has been median sternotomy, CABG and mitral valve replacement.  Endotracheal tube has its tip 3.5 cm above the carina.  Nasogastric tube enters the stomach.  Swan-Ganz catheter has its tip in the main pulmonary artery.  Left chest tube is in place.  There is no pneumothorax.  There is basilar atelectasis left worse than right.  No pulmonary edema.  IMPRESSION: Lines and tubes well positioned.  Lower lobe atelectasis left worse than right.  No pneumothorax or edema.  Original Report Authenticated By: Thomasenia Sales, M.D.     Assessment/Plan: S/P Procedure(s) (LRB): CORONARY ARTERY BYPASS GRAFTING (CABG) (N/A) MAZE (N/A) MITRAL VALVE REPAIR (MVR) (N/A) Mobilize d/c tubes/lines See  progression orders Low dose haldol    [name] MD  Beeper [phone] Office [phone] 06/07/2011 8:03 AM

## 2011-06-07 NOTE — Progress Notes (Signed)
Irrigated foley catheter with 30mL using piston syringe and sterile technique.  Approximately 50mL fluid with bright, red clots noted in collection container.  Patient verbalized relief of urinary urgency after irrigation was completed.  Will continue to monitor foley catheter for patency.

## 2011-06-07 NOTE — Op Note (Signed)
NAMELAKER, THOMPSON NO.:  [id number]  MEDICAL RECORD NO.:  [id number]  LOCATION:  2311                         FACILITY:  [facility]  PHYSICIAN:  Sheliah Plane, MD    DATE OF BIRTH:  Jul 01, 1935  DATE OF PROCEDURE:  06/06/2011 DATE OF DISCHARGE:                              OPERATIVE REPORT   PREOPERATIVE DIAGNOSES:  Severe ischemic cardiomyopathy, with left main coronary obstruction, moderate-to-severe mitral regurgitation secondary to annular dilatation, atrial fibrillation.  POSTOPERATIVE DIAGNOSES:  Severe ischemic cardiomyopathy, with left main coronary obstruction, moderate-to-severe mitral regurgitation secondary to annular dilatation, atrial fibrillation.  SURGICAL PROCEDURE PERFORMED: 1. Coronary artery bypass grafting x3 with the left internal mammary     to the left anterior descending coronary artery, reverse saphenous     vein graft to the intermediate coronary artery, reverse saphenous     vein graft to the distal right coronary artery. 2. Left-sided MAZE procedure. 3. Mitral valve repair with ring annuloplasty with a Sorin ring model     #SMT 30, serial # [id number]. 4. Right leg endo vein harvesting. 5. Closure of left atrial appendage.  SURGEON:  Sheliah Plane, MD  FIRST ASSISTANT:  Rowe Clack, PA-C.  SECOND ASSISTANT:  Cecile Sheerer, [place].  BRIEF HISTORY:  The patient is a 76 year old male who over the past 8 months developed atrial fibrillation, then was admitted twice with syncopal episodes.  Ultimately, a cardiac catheterization was performed after echocardiogram showed at least moderate mitral regurgitation, severe LV dysfunction with ejection fraction of 20%.  The patient had been maintained on Coumadin because of atrial fibrillation, which was of new onset in the Spring of 2012.  The patient denied any chest pain, had no known history of myocardial infarction.  Symptoms were primarily of heart failure, which had been progressively  worsening.  He was seen as a surgical consultation and coronary artery bypass grafting, possible mitral repair and Maze was recommended to the patient who agreed and signed informed consent.  Both he and his wife were aware because of his severe LV dysfunction.  His known renal insufficiency  preop that he was at increased operative risk.  The patient agreed and signed informed consent.  DESCRIPTION OF PROCEDURE:  With Swan-Ganz and arterial line monitors in place, the patient underwent general endotracheal anesthesia without incident.  The skin of the chest and legs was prepped with Betadine and draped in usual sterile manner.  Dictated under separate note is a TEE performed by Dr. Noreene Larsson.  This did confirm severe LV dysfunction as noted and 3+ mitral regurgitation with a central jet, which appeared primarily to be related to annular dilatation.  There did not appear to be any large intra-atrial clot, however, this was difficult, small clot could not be definitely ruled out.  Using Guidant endo-vein harvesting system, vein was harvested from the right thigh and calf and was of adequate quality and caliber.  Median sternotomy was performed.  Left internal mammary artery was dissected down as pedicle graft.  The distal artery was divided and had good free flow.  The pericardium was opened. The patient had evidence of biventricular hypertrophy and cardiac enlargement with decreased LV function.  He was systemically heparinized.  The ascending aorta was cannulated.  A retrograde cardioplegia catheter was placed.  Superior and inferior vena caval cannulas were placed.  Caval tapes were placed.  The patient's aorta was mildly dilated, measured at 4 cm.  He was placed on cardiopulmonary bypass 2.4 L/min/m2.  Sites of anastomosis were dissected out of the epicardium.  The patient's body temperature was cooled to 32 degrees, and aortic root vent cardioplegia needle was introduced into  the ascending aorta.  Aortic crossclamp was applied and 500 mL of cold blood potassium cardioplegia was administered with diastolic arrest of the heart.  Myocardial septal temperature was monitored throughout the crossclamp.  Attention was turned first to ablating the left atrial appendage with a Medtronic Cardioblate System.  The left pulmonary veins were then also ablated with the bipolar device.  Attention was then turned to the coronary arteries.  The right coronary artery was opened, was somewhat thickened but did admit a 1.5 mm probe distally.  Using a running 7-0 Prolene, a distal anastomosis was performed with a segment of reverse saphenous vein graft.  Attention was then turned to a large intermediate coronary artery, which was opened and admitted a 1.5 mm probe, using a running 7-0 Prolene, distal anastomosis was performed.  A very small circumflex proper vessel was present on films but was extremely small as it arose from the AV groove and was too small to bypass.  Attention was then turned to the left anterior descending coronary artery.  The LAD was opened in the midportion.  Using a running 8-0 Prolene, the left internal mammary artery was anastomosed to left anterior descending coronary artery.  The distal LAD was relatively small, but approximately 1.3 mm in size.  The fascia was tacked to the epicardium.  Attention was then turned to the left atrium.  In the interatrial groove, the left atrium was opened.  Retractors were placed to get good visualization of the mitral valve.  Again, with a combination of atriotomy and the Cardioblate device the lesion lines were created around the right pulmonary veins and a box lesion connecting the right and left coronary veins with the monopolar device lesions to the atrial appendage and to the mitral valve were completed. The mitral valve was carefully examined.  The leaflets were all intact and fairly normal in appearance.  There  were no ruptured cords.  It appeared that the primary lesion was that of annular dilatation.  The anulus was sized for a #30 Sorin mitral valve annuloplasty ring.  A #2 Tycron pledgeted sutures were placed in a circumferential manner around the anulus and the ring secured in place with passive filling of the ventricle, there was no evidence of leak.  The atrial appendage was then closed using a running 4-0 Prolene suture in 2 layers.  There was no obvious clot in the atrial appendage that could be visualized.  The atriotomy was then closed leaving a vent in place while the proximal anastomoses were completed.  Each of the 2 vein grafts were anastomosed to the ascending aorta.  The heart was allowed to passively fill and de- aired through the atriotomy which was then closed and further de-airing through the ascending aortotomies where the vein grafts were sewn on. The aortic crossclamp was then removed after the bulldog on the mammary artery was removed and there was prompt rise in myocardial septal temperature.  Aortic crossclamp was 168 minutes.  The patient converted to a sinus rhythm spontaneously.  A  16-gauge needle was introduced into the left ventricular apex to further de-air the heart.  The patient had been maintained on a renal dose dopamine while on bypass, loading dose of  milrinone was given.  Sites anastomosed were inspected and free of bleeding.  He was then ventilated and weaned from cardiopulmonary bypass without difficulty.  He remained hemodynamically stable.  He was decannulated in usual fashion.  Protamine sulfate was administered. Atrial and ventricular pacing wires had been applied.  A left pleural tube and a Blake mediastinal drain were left in place.  Pericardium was loosely reapproximated.  Sternum closed with #6 stainless steel wire. Fascia closed with interrupted 0 Vicryl, running 3-0 Vicryl subcutaneous tissue, and 4-0 subcuticular stitch in skin edges.  Dry  dressings were applied.  Sponge and needle count was reported as correct at the completion of the procedure.  The patient tolerated the procedure without obvious complication and was transferred to surgical intensive care unit for further postoperative care.  Total pump time was 212 minutes.  He did not require any blood bank blood products during the operative procedure.  At the completion of the procedure prior to removing the TEE, there showed no evidence of mitral regurgitation.     Sheliah Plane, MD     EG/MEDQ  D:  06/07/2011  T:  06/07/2011  Job:  [id number]  cc:   Peter M. [place], M.D.

## 2011-06-07 NOTE — Progress Notes (Signed)
UR Completed.  Kharson Rasmusson Jane [phone] 06/07/2011  

## 2011-06-08 ENCOUNTER — Inpatient Hospital Stay (HOSPITAL_COMMUNITY): Payer: Medicare Other

## 2011-06-08 ENCOUNTER — Encounter (HOSPITAL_COMMUNITY): Payer: Self-pay | Admitting: Anesthesiology

## 2011-06-08 LAB — POCT I-STAT, CHEM 8
BUN: 21 mg/dL (ref 6–23)
Calcium, Ion: 1.19 mmol/L (ref 1.12–1.32)
Chloride: 112 mEq/L (ref 96–112)
Creatinine, Ser: 1.7 mg/dL — ABNORMAL HIGH (ref 0.50–1.35)
Glucose, Bld: 131 mg/dL — ABNORMAL HIGH (ref 70–99)
HCT: 39 % (ref 39.0–52.0)
Hemoglobin: 13.3 g/dL (ref 13.0–17.0)
Potassium: 3 mEq/L — ABNORMAL LOW (ref 3.5–5.1)
Sodium: 146 mEq/L — ABNORMAL HIGH (ref 135–145)
TCO2: 20 mmol/L (ref 0–100)

## 2011-06-08 LAB — BASIC METABOLIC PANEL
BUN: 25 mg/dL — ABNORMAL HIGH (ref 6–23)
CO2: 24 mEq/L (ref 19–32)
Calcium: 9.6 mg/dL (ref 8.4–10.5)
Chloride: 107 mEq/L (ref 96–112)
Creatinine, Ser: 1.72 mg/dL — ABNORMAL HIGH (ref 0.50–1.35)
GFR calc Af Amer: 44 mL/min — ABNORMAL LOW (ref >90)
GFR calc non Af Amer: 38 mL/min — ABNORMAL LOW (ref >90)
Glucose, Bld: 144 mg/dL — ABNORMAL HIGH (ref 70–99)
Potassium: 4 mEq/L (ref 3.5–5.1)
Sodium: 139 mEq/L (ref 135–145)

## 2011-06-08 LAB — CBC
HCT: 29.9 % — ABNORMAL LOW (ref 39.0–52.0)
Hemoglobin: 10.3 g/dL — ABNORMAL LOW (ref 13.0–17.0)
MCH: 31.1 pg (ref 26.0–34.0)
MCHC: 34.4 g/dL (ref 30.0–36.0)
MCV: 90.3 fL (ref 78.0–100.0)
Platelets: 77 10*3/uL — ABNORMAL LOW (ref 150–400)
RBC: 3.31 MIL/uL — ABNORMAL LOW (ref 4.22–5.81)
RDW: 13.8 % (ref 11.5–15.5)
WBC: 21.1 10*3/uL — ABNORMAL HIGH (ref 4.0–10.5)

## 2011-06-08 LAB — GLUCOSE, CAPILLARY
Glucose-Capillary: 134 mg/dL — ABNORMAL HIGH (ref 70–99)
Glucose-Capillary: 137 mg/dL — ABNORMAL HIGH (ref 70–99)
Glucose-Capillary: 140 mg/dL — ABNORMAL HIGH (ref 70–99)
Glucose-Capillary: 146 mg/dL — ABNORMAL HIGH (ref 70–99)
Glucose-Capillary: 153 mg/dL — ABNORMAL HIGH (ref 70–99)
Glucose-Capillary: 154 mg/dL — ABNORMAL HIGH (ref 70–99)

## 2011-06-08 LAB — POCT I-STAT 3, ART BLOOD GAS (G3+)
Acid-base deficit: 2 mmol/L (ref 0.0–2.0)
Bicarbonate: 19.7 mEq/L — ABNORMAL LOW (ref 20.0–24.0)
O2 Saturation: 94 %
Patient temperature: 98.6
TCO2: 20 mmol/L (ref 0–100)
pCO2 arterial: 23.4 mmHg — ABNORMAL LOW (ref 35.0–45.0)
pH, Arterial: 7.533 — ABNORMAL HIGH (ref 7.350–7.450)
pO2, Arterial: 61 mmHg — ABNORMAL LOW (ref 80.0–100.0)

## 2011-06-08 MED ORDER — POTASSIUM CHLORIDE 10 MEQ/50ML IV SOLN
INTRAVENOUS | Status: AC
  Administered 2011-06-08: 10 meq via INTRAVENOUS
  Filled 2011-06-08: qty 150

## 2011-06-08 MED ORDER — POTASSIUM CHLORIDE 10 MEQ/50ML IV SOLN
10.0000 meq | INTRAVENOUS | Status: AC | PRN
  Administered 2011-06-08 (×3): 10 meq via INTRAVENOUS

## 2011-06-08 MED FILL — Potassium Chloride Inj 2 mEq/ML: INTRAVENOUS | Qty: 40 | Status: AC

## 2011-06-08 MED FILL — Cefuroxime Sodium For Inj 1.5 GM: INTRAMUSCULAR | Qty: 1.5 | Status: AC

## 2011-06-08 MED FILL — Dexmedetomidine HCl IV Soln 200 MCG/2ML: INTRAVENOUS | Qty: 2 | Status: AC

## 2011-06-08 MED FILL — Magnesium Sulfate Inj 50%: INTRAMUSCULAR | Qty: 10 | Status: AC

## 2011-06-08 NOTE — Progress Notes (Signed)
Physical Therapy Evaluation Patient Details Name: CORNEY KNIGHTON MRN: [id number] DOB: 09-18-1935 Today's Date: 06/08/2011  Problem List:  Patient Active Problem List  Diagnoses  . Campath-induced atrial fibrillation  . Syncope  . Chronic kidney disease  . Hypertension  . Dyspnea  . Cardiomyopathy, ischemic    Past Medical History:  Past Medical History  Diagnosis Date  . Hypertension 12/2010    was initially orthostatic, but now was back to being hypotensive  . Campath-induced atrial fibrillation     with a stable rate, on no medications  . Syncope     Near-syncope, etiology is deemed secondary to the excessive heat  . Benign prostatic hypertrophy     but stable  . Chronic anticoagulation   . Coronary artery disease   . Mitral regurgitation   . Shortness of breath   . Chronic kidney disease 12/2010    with a creatinine on discharge of 1.84, was  2.1 on admission  . Calculus of kidney   . H/O hiatal hernia    Past Surgical History:  Past Surgical History  Procedure Date  . Circumcision 08/12/2002    because of phimosis  . Coronary artery bypass graft 06/06/2011    Procedure: CORONARY ARTERY BYPASS GRAFTING (CABG);  Surgeon: [name], MD;  Location: [facility] OR;  Service: Open Heart Surgery;  Laterality: N/A;  grafts times three using left internal mammary artery and right leg greater saphenous vein harvested endoscopically  . Maze 06/06/2011    Procedure: MAZE;  Surgeon: [name], MD;  Location: [facility] OR;  Service: Open Heart Surgery;  Laterality: N/A;  . Mitral valve repair 06/06/2011    Procedure: MITRAL VALVE REPAIR (MVR);  Surgeon: [name], MD;  Location: [facility] OR;  Service: Open Heart Surgery;  Laterality: N/A;    PT Assessment/Plan/Recommendation PT Assessment Clinical Impression Statement: Patient is s/p CABG and MVR with decr mobility secondary to lethargy, confusion and weakness.  Will benefit from PT to address balance and endurance issues.  Will most  likely need ST NHP on D/C with therapy to return home eventually.    PT Recommendation/Assessment: Patient will need skilled PT in the acute care venue PT Problem List: Decreased strength;Decreased activity tolerance;Decreased balance;Decreased mobility;Decreased knowledge of use of DME;Decreased safety awareness;Decreased knowledge of precautions;Pain PT Therapy Diagnosis : Generalized weakness PT Plan PT Frequency: Min 3X/week PT Treatment/Interventions: DME instruction;Gait training;Stair training;Functional mobility training;Therapeutic activities;Therapeutic exercise;Balance training;Patient/family education PT Recommendation Follow Up Recommendations: Skilled nursing facility;24 hour supervision/assistance Equipment Recommended: Defer to next venue PT Goals  Acute Rehab PT Goals PT Goal Formulation: With patient Time For Goal Achievement: 2 weeks Pt will go Supine/Side to Sit: with min assist;with cues (comment type and amount) PT Goal: Supine/Side to Sit - Progress: Other (comment) Pt will go Sit to Stand: with supervision PT Goal: Sit to Stand - Progress: Other (comment) Pt will Transfer Bed to Chair/Chair to Bed: with supervision PT Transfer Goal: Bed to Chair/Chair to Bed - Progress: Other (comment) Pt will Ambulate: 51 - 150 feet;with supervision;with least restrictive assistive device PT Goal: Ambulate - Progress: Other (comment) Pt will Go Up / Down Stairs: 1-2 stairs;with min assist;with least restrictive assistive device PT Goal: Up/Down Stairs - Progress: Other (comment) Additional Goals Additional Goal #1: Patient will follow 3/3 sternal precautions with all mobility with cues only.  PT Goal: Additional Goal #1 - Progress: Other (comment)  PT Evaluation Precautions/Restrictions  Precautions Precautions: Fall;Sternal Precaution Comments: Encouraged use of pillow.  Required Braces or Orthoses: No Restrictions Weight Bearing Restrictions: No Prior Functioning  Home  Living Lives With: Spouse Receives Help From: Family Type of Home: House Home Layout: One level Home Access: Stairs to enter Entrance Stairs-Rails: None Entrance Stairs-Number of Steps: 1 Bathroom Shower/Tub: Health visitor: Standard Home Adaptive Equipment: Built-in shower seat;Bedside commode/3-in-1;Walker - rolling;Straight cane Prior Function Level of Independence: Independent with basic ADLs;Independent with homemaking with ambulation;Independent with gait Driving: Yes Vocation: Retired Financial risk analyst Arousal/Alertness: Lethargic Overall Cognitive Status: Impaired Attention: Impaired Current Attention Level: Focused Attention - Other Comments: secondds only Memory: Decreased recall of precautions Memory Deficits: Pt. lethargic with decr recall ? secondary to lethargy vs. confusion. Orientation Level: Oriented to person;Disoriented to time;Disoriented to situation Following Commands: Follows one step commands inconsistently;Follows multi-step commands inconsistently Safety/Judgement: Decreased awareness of safety precautions;Decreased safety judgement for tasks assessed Decreased Safety/Judgement: Decreased awareness of need for assistance Awareness of Deficits: Decreased awareness of deficits Sensation/Coordination Sensation Light Touch: Appears Intact Stereognosis: Not tested Hot/Cold: Not tested Proprioception: Not tested Coordination Gross Motor Movements are Fluid and Coordinated: Yes Fine Motor Movements are Fluid and Coordinated: Yes Extremity Assessment RUE Assessment RUE Assessment: Exceptions to [facility] RUE Strength RUE Overall Strength: Within Functional Limits for tasks performed;Deficits;Due to precautions LUE Assessment LUE Assessment: Exceptions to [facility] LUE Strength LUE Overall Strength: Deficits;Due to precautions;Within Functional Limits for tasks assessed RLE Assessment RLE Assessment: Exceptions to [facility] RLE Strength RLE Overall  Strength: Within Functional Limits for tasks assessed;Deficits;Due to pain LLE Assessment LLE Assessment: Exceptions to [facility] LLE Strength LLE Overall Strength: Within Functional Limits for tasks assessed Mobility (including Balance) Bed Mobility Bed Mobility: Yes Rolling Left: 3: Mod assist Rolling Left Details (indicate cue type and reason): Pt. instructed to hug pillow.  Pt. assisted with movement.   Left Sidelying to Sit: 1: +2 Total assist;Patient percentage (comment);HOB elevated (comment degrees) (HOB 20 degrees, pt = 50%) Left Sidelying to Sit Details (indicate cue type and reason): Pt needed encouragement to hug pillow and not use arms.   Sitting - Scoot to Edge of Bed: 3: Mod assist Sitting - Scoot to Edge of Bed Details (indicate cue type and reason): Pt. needed assist to reciprocal scoot with cues and assist with pad.  Needed cues to not use UEs.   Transfers Transfers: Yes Sit to Stand: 1: +2 Total assist;Patient percentage (comment);From bed;Without upper extremity assist (pt = 50% with two attempts.) Sit to Stand Details (indicate cue type and reason): Pt. needed cues to hug pillow and not to use UEs with sit to stand.  Needed assist for anterior translation of pelvis.   Stand to Sit: 1: +2 Total assist;Patient percentage (comment);Without upper extremity assist;To chair/3-in-1 Stand to Sit Details: Assist needed to control descent Ambulation/Gait Ambulation/Gait: Yes Ambulation/Gait Assistance: 1: +2 Total assist;Patient percentage (comment) (pt = 60%) Ambulation/Gait Assistance Details (indicate cue type and reason): Pt. needed asssit to steer RW.  Pt. also needed cues to sequence steps.  When pt. began fatiguing, he began to lean onto UEs and needed to sit down at that point.   Ambulation Distance (Feet): 30 Feet Assistive device: Rolling walker Gait Pattern: Decreased stride length;Step-to pattern;Shuffle;Trunk flexed Gait velocity: Slow cadence Stairs: No Wheelchair  Mobility Wheelchair Mobility: No  Posture/Postural Control Posture/Postural Control: Postural limitations Postural Limitations: Slightly forward flexed posture Balance Balance Assessed: Yes Static Sitting Balance Static Sitting - Balance Support: Feet supported;No upper extremity supported Static Sitting - Level of Assistance: 4: Min assist Static Sitting - Comment/#  of Minutes: 2 minutes Exercise  General Exercises - Lower Extremity Ankle Circles/Pumps: AROM;Both;5 reps;Supine End of Session PT - End of Session Equipment Utilized During Treatment:  (No gait belt used secondary to sternal incision) Activity Tolerance: Patient limited by fatigue Patient left: in chair;with call bell in reach Nurse Communication: Mobility status for ambulation General Behavior During Session: Lethargic Cognition: Impaired Initial HR 83 Initial BP 142/95 Initial MAP 106 Initial RR 20 Initial O2 sat 95%  HR with activity 83 O2 sat with activity 96%  Post HR 79 Post BP 136/79 Post MAP 93 Post RR 21 Post O2 sat 96% Patient on RA.   INGOLD,Toshiye Kever 06/08/2011, 11:01 AM [facility] [phone] (pager)

## 2011-06-08 NOTE — Progress Notes (Addendum)
Patient ID: Troy Gregory, male   DOB: 08/18/1935, 76 y.o.   MRN: [id number] TCTS DAILY PROGRESS NOTE                   [address]            [place] [address]          [phone]      2 Days Post-Op Procedure(s) (LRB): CORONARY ARTERY BYPASS GRAFTING (CABG) (N/A) MAZE (N/A) MITRAL VALVE REPAIR (MVR) (N/A)  Total Length of Stay:  LOS: 2 days   Subjective: Less confused, knows he is at [facility]  Objective: Vital signs in last 24 hours: Patient Vitals for the past 24 hrs:  BP Temp Temp src Pulse Resp SpO2 Weight  06/08/11 0723 - 98.2 F (36.8 C) Oral - - - -  06/08/11 0700 134/78 mmHg - - 55  18  97 % -  06/08/11 0600 142/92 mmHg - - 80  17  98 % 176 lb 12.9 oz (80.2 kg)  06/08/11 0500 139/95 mmHg - - 70  21  98 % -  06/08/11 0414 - 98.3 F (36.8 C) Oral - - - -  06/08/11 0400 133/87 mmHg - - 82  8  97 % -  06/08/11 0300 131/99 mmHg - - 66  13  95 % -  06/08/11 0200 168/73 mmHg - - 65  18  95 % -  06/08/11 0100 137/64 mmHg - - 46  18  94 % -  06/08/11 0010 - 98.6 F (37 C) Oral - - - -  06/08/11 0000 156/89 mmHg - - 54  17  96 % -  06/07/11 2300 121/81 mmHg - - 70  16  96 % -  06/07/11 2200 153/93 mmHg - - 69  17  94 % -  06/07/11 2100 137/91 mmHg - - 82  9  95 % -  06/07/11 2056 - 99.1 F (37.3 C) Oral - - - -  06/07/11 2000 130/69 mmHg - - 87  19  94 % -  06/07/11 1900 143/105 mmHg - - 89  11  94 % -  06/07/11 1800 138/92 mmHg - - 98  22  98 % -  06/07/11 1700 137/75 mmHg - - 90  16  96 % -  06/07/11 1618 - 97.7 F (36.5 C) Oral - - - -  06/07/11 1600 120/73 mmHg - - 90  19  96 % -  06/07/11 1500 117/70 mmHg - - 90  17  96 % -  06/07/11 1400 110/72 mmHg - - 91  19  97 % -  06/07/11 1303 - 97.9 F (36.6 C) Oral - - - -  06/07/11 1300 95/57 mmHg - - 90  18  97 % -  06/07/11 1200 126/64 mmHg - - 90  15  98 % -  06/07/11 1100 109/67 mmHg - - 90  17  98 % -  06/07/11 1000 156/79 mmHg - - 93  19  97 % -  06/07/11 0900 - - - 90  18  97 % -    06/07/11 0800 114/68 mmHg 98.6 F (37 C) - 90  14  98 % -  06/07/11 0756 - 98.6 F (37 C) Core - - - -   Wt Readings from Last 3 Encounters:  06/08/11 176 lb 12.9 oz (80.2 kg)  06/08/11 176 lb 12.9 oz (80.2 kg)  06/04/11 173 lb 1 oz (78.5  kg)    Hemodynamic parameters for last 24 hours: PAP: (46)/(19) 46/19 mmHg  Intake/Output from previous day: 01/03 0701 - 01/04 0700 In: 2033.1 [P.O.:620; I.V.:929.1; IV Piggyback:304] Out: 1980 [Urine:1745; Chest Tube:235]    Current Meds: Scheduled Meds:   . acetaminophen  1,000 mg Oral Q6H   Or  . acetaminophen (TYLENOL) oral liquid 160 mg/5 mL  975 mg Per Tube Q6H  . aspirin EC  325 mg Oral Daily   Or  . aspirin  324 mg Per Tube Daily  . bisacodyl  10 mg Oral Daily   Or  . bisacodyl  10 mg Rectal Daily  . cefUROXime (ZINACEF)  IV  1.5 g Intravenous Q12H  . docusate sodium  200 mg Oral Daily  . dutasteride  0.5 mg Oral Daily  . furosemide  40 mg Intravenous Once  . haloperidol lactate  2 mg Intravenous Once  . insulin aspart  0-24 Units Subcutaneous Q4H  . insulin aspart  0-24 Units Subcutaneous Q4H  . metoprolol tartrate  12.5 mg Oral BID   Or  . metoprolol tartrate  12.5 mg Per Tube BID  . pantoprazole  40 mg Oral Q1200  . simvastatin  20 mg Oral Daily  . sodium chloride  3 mL Intravenous Q12H   Continuous Infusions:   . sodium chloride Stopped (06/06/11 1700)  . sodium chloride    . sodium chloride    . amiodarone (CORDARONE) infusion 30 mg/hr (06/08/11 0700)  . dexmedetomidine (PRECEDEX) IV infusion Stopped (06/06/11 2000)  . DOPamine 3 mcg/kg/min (06/08/11 0700)  . insulin (NOVOLIN-R) infusion 0.6 mL/hr at 06/06/11 2100  . lactated ringers 20 mL/hr at 06/07/11 1800  . milrinone 0.3 mcg/kg/min (06/08/11 0700)  . nitroGLYCERIN 10 mcg/min (06/08/11 0700)  . phenylephrine (NEO-SYNEPHRINE) Adult infusion Stopped (06/07/11 0800)   PRN Meds:.albumin human, haloperidol lactate, metoprolol, midazolam, morphine,  ondansetron (ZOFRAN) IV, oxyCODONE, potassium chloride, sodium chloride  General appearance: alert, distracted, no distress and slowed mentation Neurologic: intact Heart: regular rate and rhythm, S1, S2 normal, no murmur, click, rub or gallop Lungs: clear to auscultation bilaterally Abdomen: soft, non-tender; bowel sounds normal; no masses,  no organomegaly Wound: stenum stable  Lab Results: CBC: Basename 06/08/11 0457 06/08/11 0145 06/07/11 1700  WBC 21.1* -- 22.3*  HGB 10.3* 13.3 --  HCT 29.9* 39.0 --  PLT 77* -- 85*   BMET:  Basename 06/08/11 0457 06/08/11 0145 06/07/11 0500  NA 139 146* --  K 4.0 3.0* --  CL 107 112 --  CO2 24 -- 22  GLUCOSE 144* 131* --  BUN 25* 21 --  CREATININE 1.72* 1.70* --  CALCIUM 9.6 -- 9.0    PT/INR:  Basename 06/06/11 1630  LABPROT 20.6*  INR 1.73*   Radiology: Dg Chest Portable 1 View In Am  06/07/2011  *RADIOLOGY REPORT*  Clinical Data: Postop open heart surgery  PORTABLE CHEST - 1 VIEW  Comparison: Portable chest x-ray of 06/06/2011  Findings: The endotracheal tube has been removed.  Left chest tube and Swan-Ganz catheter remain.  The lungs are not quite as well aerated with basilar atelectasis noted.  Cardiomegaly is stable. No pneumothorax is seen.  IMPRESSION: Endotracheal tube removed.  Persistent basilar opacities most consistent with atelectasis, left greater than right.  Original Report Authenticated By: Juline Patch, M.D.   Dg Chest Portable 1 View  06/06/2011  *RADIOLOGY REPORT*  Clinical Data: Postop CABG  PORTABLE CHEST - 1 VIEW  Comparison: 06/04/2011  Findings: There has been median  sternotomy, CABG and mitral valve replacement.  Endotracheal tube has its tip 3.5 cm above the carina.  Nasogastric tube enters the stomach.  Swan-Ganz catheter has its tip in the main pulmonary artery.  Left chest tube is in place.  There is no pneumothorax.  There is basilar atelectasis left worse than right.  No pulmonary edema.  IMPRESSION: Lines and  tubes well positioned.  Lower lobe atelectasis left worse than right.  No pneumothorax or edema.  Original Report Authenticated By: Thomasenia Sales, M.D.     Assessment/Plan: S/P Procedure(s) (LRB): CORONARY ARTERY BYPASS GRAFTING (CABG) (N/A) MAZE (N/A) MITRAL VALVE REPAIR (MVR) (N/A) Mobilize Diuresis d/c tubes/lines See progression orders PT to see and work on mobilizing Wean off milinone Thrombocytopenia avoid heparin currently Metabolic/drug induced encephalopathy improving Chronic renal insufficiency stable Leave foley at request of Urology  [name] MD  Beeper [phone] Office [phone] 06/08/2011 7:26 AM

## 2011-06-08 NOTE — Op Note (Signed)
Patient Name: Troy Gregory MRN: [id number] Age: 76 y.o. Sex: male      Impression:  Troy Gregory is a 76 year old white male who was admitted with a history of progressive shortness of breath and atrial fibrillation. He was found to have severe left main left anterior descending and circumflex coronary artery stenoses. He was also found to have left ventricular dysfunction with an ejection fraction of 20-25% by echo he was also noted to have moderate to severe mitral regurgitation. He is now scheduled to undergo coronary artery bypass grafting and possible mitral valve repair by Dr. Lowella Fairy.  Intraoperative transesophageal echocardiography was indicated to evaluate the left and right ventricular function and to assist with the mitral valve repair.  The patient was brought to the operating room at [facility] and general anesthesia was induced without difficulty. Trachea was intubated without difficulty and following orogastric suctioning the transesophageal echocardiography probe was then inserted into the esophagus without difficulty.  PRE-BYPASS FINDINGS: 1. Aortic valve - the aortic valve is trileaflet the leaflets opened normally and there was no aortic insufficiency. There was no thickening or calcification of the leaflets noted.  2. Mitral valve - the mitral valve annulus was dilated and the leaflets were apically tethered. There was a central jet of mitral regurgitation which occurred along the entire coaptation line of the mitral leaflets. This was graded as moderate to severe. The regurgitant orifice area using the Pisa method was 0.26 cm. The the leaflets did not appear structurally abnormal. There were no prolapsing segments or flail segments noted.  3. Left ventricle -  there was global left ventricular dysfunction and the left ventricle is dilated. There was global left ventricular hypokinesis and there were no discreet areas of akinesis or dyskinesis the could be  appreciated. Left ventricular ejection fraction was estimated at 20-25%. There was no thrombus noted in the left ventricular apex. Left ventricular end-diastolic diameter measured 4.6 cm at the mid-papillary level in the short axis view and there was moderate left ventricular hypertrophy with left ventricular wall thickness measuring 1.2-1.25 cm at end diastole at the mid-papillary level.  4. Right ventricle - the right ventricle was enlarged and there was moderately decreased contractility of the right ventricular free wall. This was consistent with moderate right ventricular dysfunction. There was no septal flattening noted.  5. Tricuspid valve - the tricuspid valve appears structurally intact and there was trace to 1+ tricuspid insufficiency.  6. Interatrial septum -the interatrial septum was intact without evidence of patent foramen ovale or atrial septal defect by color Doppler or bubble study.   7. Left atrium - the left atrium appeared enlarged. There was no thrombus noted in the left atrial cavity. However in the left atrial appendage there was spontaneous echo contrast or smoke noted and there was suspicion for a left atrial appendage thrombus. However this could have represented pectinate muscle as well.   POST-BYPASS FINDINGS: Aortic valve - the aortic valve appeared normal and was unchanged from the pre-bypass study.  Mitral valve - there was an annuloplasty ring in the mitral position.The posterior leaflet was fixed and the anterior leaflet was freely mobile. There was no residual mitral regurgitation noted. Continuous wave Doppler interrogation of the mitral inflow revealed a mean transmitral gradient of 3 mm of mercury and the mitral valve area of 2.42 cm by the pressure half-time method.  Left ventricle - there was global left ventricular hypokinesis and ejection fraction of 25%. There did appear to be some  improvement in overall left ventricular contractility in the post-bypass  period. The patient was on inotropic support as well.  Right ventricle - the right ventricular function appeared improved from the pre-bypass study. The right ventricular size was decreased and there was some improvement in the contractility of the right ventricular free wall.  Tricuspid valve - there was trace tricuspid insufficiency  Left atrial appendage -the left atrial appendage could not be imaged.    Melonie Florida, MD 06/08/2011 1:29 PM

## 2011-06-08 NOTE — Progress Notes (Signed)
Patient ID: Troy Gregory, male   DOB: [birth year], 76 y.o.   MRN: [id number]  Filed Vitals:   06/08/11 1200 06/08/11 1300 06/08/11 1400 06/08/11 1638  BP: 143/92 124/89 140/98   Pulse: 80 67 77   Temp:    99 F (37.2 C)  TempSrc:    Oral  Resp: 20 17 19    Height:      Weight:      SpO2: 95% 98% 96%    Sinus 70 on amio drip  Urine output adequate.  Remains on low dose dopamine  Postop delerium.  CBC    Component Value Date/Time   WBC 21.1* 06/08/2011 0457   RBC 3.31* 06/08/2011 0457   HGB 10.3* 06/08/2011 0457   HCT 29.9* 06/08/2011 0457   PLT 77* 06/08/2011 0457   MCV 90.3 06/08/2011 0457   MCH 31.1 06/08/2011 0457   MCHC 34.4 06/08/2011 0457   RDW 13.8 06/08/2011 0457   LYMPHSABS 1.2 05/21/2011 1522   MONOABS 0.8 05/21/2011 1522   EOSABS 0.2 05/21/2011 1522   BASOSABS 0.1 05/21/2011 1522    BMET    Component Value Date/Time   NA 139 06/08/2011 0457   K 4.0 06/08/2011 0457   CL 107 06/08/2011 0457   CO2 24 06/08/2011 0457   GLUCOSE 144* 06/08/2011 0457   BUN 25* 06/08/2011 0457   CREATININE 1.72* 06/08/2011 0457   CALCIUM 9.6 06/08/2011 0457   GFRNONAA 38* 06/08/2011 0457   GFRAA 44* 06/08/2011 0457    A/P:  Stable day.  Continue dopamine for now and repeat creat in am.

## 2011-06-08 NOTE — Progress Notes (Signed)
Foley catheter was irrigated with 300 ml of sterile water using piston syringe and sterile technique.  Several clots were aspirated after intermittent irrigation.  All 300 ml of irrigated sterile water was returned with an extra 75 ml of amber urine.  Patient appears more comfortable post irrigation.  Will continue to monitor foley catheter output.

## 2011-06-09 ENCOUNTER — Inpatient Hospital Stay (HOSPITAL_COMMUNITY): Payer: Medicare Other

## 2011-06-09 LAB — CBC
HCT: 31.4 % — ABNORMAL LOW (ref 39.0–52.0)
Hemoglobin: 10.4 g/dL — ABNORMAL LOW (ref 13.0–17.0)
MCH: 30.3 pg (ref 26.0–34.0)
MCHC: 33.1 g/dL (ref 30.0–36.0)
MCV: 91.5 fL (ref 78.0–100.0)
Platelets: 82 10*3/uL — ABNORMAL LOW (ref 150–400)
RBC: 3.43 MIL/uL — ABNORMAL LOW (ref 4.22–5.81)
RDW: 14.1 % (ref 11.5–15.5)
WBC: 20.8 10*3/uL — ABNORMAL HIGH (ref 4.0–10.5)

## 2011-06-09 LAB — BASIC METABOLIC PANEL
BUN: 31 mg/dL — ABNORMAL HIGH (ref 6–23)
CO2: 24 mEq/L (ref 19–32)
Calcium: 9.6 mg/dL (ref 8.4–10.5)
Chloride: 107 mEq/L (ref 96–112)
Creatinine, Ser: 1.39 mg/dL — ABNORMAL HIGH (ref 0.50–1.35)
GFR calc Af Amer: 56 mL/min — ABNORMAL LOW (ref >90)
GFR calc non Af Amer: 49 mL/min — ABNORMAL LOW (ref >90)
Glucose, Bld: 141 mg/dL — ABNORMAL HIGH (ref 70–99)
Potassium: 3.9 mEq/L (ref 3.5–5.1)
Sodium: 141 mEq/L (ref 135–145)

## 2011-06-09 LAB — GLUCOSE, CAPILLARY
Glucose-Capillary: 129 mg/dL — ABNORMAL HIGH (ref 70–99)
Glucose-Capillary: 148 mg/dL — ABNORMAL HIGH (ref 70–99)
Glucose-Capillary: 160 mg/dL — ABNORMAL HIGH (ref 70–99)
Glucose-Capillary: 172 mg/dL — ABNORMAL HIGH (ref 70–99)
Glucose-Capillary: 173 mg/dL — ABNORMAL HIGH (ref 70–99)

## 2011-06-09 LAB — PROTIME-INR
INR: 1.64 — ABNORMAL HIGH (ref 0.00–1.49)
Prothrombin Time: 19.7 seconds — ABNORMAL HIGH (ref 11.6–15.2)

## 2011-06-09 MED ORDER — WARFARIN SODIUM 2.5 MG PO TABS
2.5000 mg | ORAL_TABLET | Freq: Once | ORAL | Status: AC
  Administered 2011-06-09: 2.5 mg via ORAL
  Filled 2011-06-09: qty 1

## 2011-06-09 MED ORDER — FUROSEMIDE 40 MG PO TABS
40.0000 mg | ORAL_TABLET | Freq: Every day | ORAL | Status: DC
  Administered 2011-06-09 – 2011-06-10 (×2): 40 mg via ORAL
  Filled 2011-06-09 (×2): qty 1

## 2011-06-09 MED ORDER — AMIODARONE HCL 200 MG PO TABS
400.0000 mg | ORAL_TABLET | Freq: Two times a day (BID) | ORAL | Status: DC
  Administered 2011-06-09 – 2011-06-10 (×3): 400 mg via ORAL
  Filled 2011-06-09 (×4): qty 2

## 2011-06-09 MED ORDER — POTASSIUM CHLORIDE CRYS ER 20 MEQ PO TBCR
20.0000 meq | EXTENDED_RELEASE_TABLET | Freq: Every day | ORAL | Status: DC
  Administered 2011-06-09 – 2011-06-10 (×2): 20 meq via ORAL
  Filled 2011-06-09 (×2): qty 1

## 2011-06-09 NOTE — Progress Notes (Signed)
Pt cont' to have little to no urine o/p; bladder scan revealed 240cc prior to being irrigated w/ 60cc sterile saline using a plunger syringe, 10cc of clr, yellow urine returned; re-scan of bladder revealed 368cc w/ Pt leaking urine around foley again, attempted to obtain urine return using a toomey syringe w/out results, Dr Laneta Simmers to be notified. Pt tolerated procedure w/out difficulty, only c/o of sl. Bladder fullness.

## 2011-06-09 NOTE — Progress Notes (Signed)
Pt urine o/p noted to be dramatically reduced, Foley cath irrigated w/ 120cc of sterile saline and syringe per MD orders, with return of only 40cc of urine; urine noted to be leaking around foley cath at times; subsequently a bladder scan revealed only 84cc of urine remains, will cont' to monitor for improved urine o/p and irrigate again if necessary; It was noted that a urology consult was done 2 days ago r/t clotting foley and persistent BPH and a 1fr foley replaced by Dr Isabel Caprice.

## 2011-06-09 NOTE — Progress Notes (Signed)
Patient ID: Troy Gregory, male   DOB: March 30, 1936, 76 y.o.   MRN: [id number]  Filed Vitals:   06/09/11 1800 06/09/11 1900 06/09/11 1958 06/09/11 2000  BP:  106/76  114/68  Pulse: 50 51  55  Temp:   97.5 F (36.4 C)   TempSrc:   Oral   Resp:  15  14  Height:      Weight:      SpO2: 100% 99%  95%    HR 58.  He has still had some Afib.  Will continue amiodarone and dc lopressor.

## 2011-06-09 NOTE — Progress Notes (Signed)
3 Days Post-Op Procedure(s) (LRB): CORONARY ARTERY BYPASS GRAFTING (CABG) (N/A) MAZE (N/A) MITRAL VALVE REPAIR (MVR) (N/A) Subjective: No complaints  Objective: Vital signs in last 24 hours: Temp:  [97.5 F (36.4 C)-99 F (37.2 C)] 97.5 F (36.4 C) (01/05 0700) Pulse Rate:  [52-136] 78  (01/05 0800) Cardiac Rhythm:  [-] Normal sinus rhythm (01/05 0800) Resp:  [12-26] 26  (01/05 0800) BP: (116-156)/(75-112) 152/99 mmHg (01/05 0800) SpO2:  [93 %-100 %] 98 % (01/05 0800) FiO2 (%):  [2 %] 2 % (01/05 0800) Weight:  [84.8 kg (186 lb 15.2 oz)] 186 lb 15.2 oz (84.8 kg) (01/05 0600)  Hemodynamic parameters for last 24 hours:    Intake/Output from previous day: 01/04 0701 - 01/05 0700 In: 1590 [P.O.:880; I.V.:480; IV Piggyback:50] Out: 735 [Urine:735] Intake/Output this shift:    General appearance: cooperative and slowed mentation Neurologic: intact Heart: regular rate and rhythm, S1, S2 normal, no murmur, click, rub or gallop Lungs: rales bibasilar Extremities: edema mild Wound: incision ok  Lab Results:  Basename 06/09/11 0330 06/08/11 0457  WBC 20.8* 21.1*  HGB 10.4* 10.3*  HCT 31.4* 29.9*  PLT 82* 77*   BMET:  Basename 06/09/11 0330 06/08/11 0457  NA 141 139  Gregory 3.9 4.0  CL 107 107  CO2 24 24  GLUCOSE 141* 144*  BUN 31* 25*  CREATININE 1.39* 1.72*  CALCIUM 9.6 9.6    PT/INR:  Basename 06/09/11 0330  LABPROT 19.7*  INR 1.64*   ABG    Component Value Date/Time   PHART 7.533* 06/08/2011 0140   HCO3 19.7* 06/08/2011 0140   TCO2 20 06/08/2011 0145   ACIDBASEDEF 2.0 06/08/2011 0140   O2SAT 94.0 06/08/2011 0140   CBG (last 3)   Basename 06/09/11 0356 06/09/11 0030 06/08/11 1954  GLUCAP 148* 160* 146*    Assessment/Plan: S/P Procedure(s) (LRB): CORONARY ARTERY BYPASS GRAFTING (CABG) (N/A) MAZE (N/A) MITRAL VALVE REPAIR (MVR) (N/A) Start low dose coumadin. Continue mobilization Diuresis Renal function improved.  Wean off dopamine Throbocytopenia;   Observe Encephalopathy continues to improve  LOS: 3 days    Troy Gregory,Troy Gregory 06/09/2011

## 2011-06-10 LAB — BASIC METABOLIC PANEL
CO2: 24 mEq/L (ref 19–32)
Calcium: 9.9 mg/dL (ref 8.4–10.5)
Creatinine, Ser: 2.16 mg/dL — ABNORMAL HIGH (ref 0.50–1.35)

## 2011-06-10 LAB — GLUCOSE, CAPILLARY
Glucose-Capillary: 144 mg/dL — ABNORMAL HIGH (ref 70–99)
Glucose-Capillary: 133 mg/dL — ABNORMAL HIGH (ref 70–99)
Glucose-Capillary: 118 mg/dL — ABNORMAL HIGH (ref 70–99)
Glucose-Capillary: 97 mg/dL (ref 70–99)
Glucose-Capillary: 132 mg/dL — ABNORMAL HIGH (ref 70–99)
Glucose-Capillary: 130 mg/dL — ABNORMAL HIGH (ref 70–99)
Glucose-Capillary: 119 mg/dL — ABNORMAL HIGH (ref 70–99)

## 2011-06-10 LAB — CBC
MCH: 30.2 pg (ref 26.0–34.0)
MCV: 91.3 fL (ref 78.0–100.0)
Platelets: 137 10*3/uL — ABNORMAL LOW (ref 150–400)
RDW: 14.2 % (ref 11.5–15.5)
WBC: 19.7 10*3/uL — ABNORMAL HIGH (ref 4.0–10.5)

## 2011-06-10 LAB — PROTIME-INR: INR: 1.44 (ref 0.00–1.49)

## 2011-06-10 MED ORDER — WARFARIN SODIUM 2.5 MG PO TABS
2.5000 mg | ORAL_TABLET | Freq: Once | ORAL | Status: AC
  Administered 2011-06-10: 2.5 mg via ORAL
  Filled 2011-06-10: qty 1

## 2011-06-10 NOTE — Progress Notes (Signed)
4 Days Post-Op Procedure(s) (LRB): CORONARY ARTERY BYPASS GRAFTING (CABG) (N/A) MAZE (N/A) MITRAL VALVE REPAIR (MVR) (N/A) Subjective:  No complaints  Objective: Vital signs in last 24 hours: Temp:  [97 F (36.1 C)-98.3 F (36.8 C)] 97 F (36.1 C) (01/06 1231) Pulse Rate:  [50-58] 56  (01/06 1300) Cardiac Rhythm:  [-] Junctional rhythm (01/06 0800) Resp:  [3-23] 18  (01/06 1300) BP: (98-139)/(61-92) 135/85 mmHg (01/06 1300) SpO2:  [93 %-100 %] 98 % (01/06 1300) Weight:  [82.4 kg (181 lb 10.5 oz)] 181 lb 10.5 oz (82.4 kg) (01/06 0600)   Looks juctional 50 this am  Hemodynamic parameters for last 24 hours:    Intake/Output from previous day: 01/05 0701 - 01/06 0700 In: 889.6 [P.O.:780; I.V.:109.6] Out: 80 [Urine:80] Intake/Output this shift: Total I/O In: 120 [P.O.:120] Out: 25 [Urine:25]  General appearance: alert, cooperative and slowed mentation Neurologic: intact Heart: regular rate and rhythm, S1, S2 normal, no murmur, click, rub or gallop Lungs: clear to auscultation bilaterally Extremities: edema minimal Wound: incision ok  Lab Results:  Basename 06/10/11 0545 06/09/11 0330  WBC 19.7* 20.8*  HGB 11.8* 10.4*  HCT 35.7* 31.4*  PLT 137* 82*   BMET:  Basename 06/10/11 0545 06/09/11 0330  NA 143 141  K 4.0 3.9  CL 107 107  CO2 24 24  GLUCOSE 124* 141*  BUN 54* 31*  CREATININE 2.16* 1.39*  CALCIUM 9.9 9.6    PT/INR:  Basename 06/10/11 0545  LABPROT 17.8*  INR 1.44   ABG    Component Value Date/Time   PHART 7.533* 06/08/2011 0140   HCO3 19.7* 06/08/2011 0140   TCO2 20 06/08/2011 0145   ACIDBASEDEF 2.0 06/08/2011 0140   O2SAT 94.0 06/08/2011 0140   CBG (last 3)   Basename 06/10/11 1229 06/10/11 0726 06/10/11 0406  GLUCAP 132* 97 118*    Assessment/Plan: S/P Procedure(s) (LRB): CORONARY ARTERY BYPASS GRAFTING (CABG) (N/A) MAZE (N/A) MITRAL VALVE REPAIR (MVR) (N/A)  He is in junctional rhythm this am at 50.  With preop EF of 20% he could be in  a low output state with junctional rate of 50. Will A-pace 80 and dc amio for now.  Chronic renal failure with baseline creat of 1.7.  His creat went from 1.4 to 2.16 since yesterday am so that could be indicative of a low output state.  Continue coumadin.  LOS: 4 days    Tiffine Henigan K 06/10/2011

## 2011-06-10 NOTE — Consults (Signed)
Urology Consult  Referring physician: Lowella Fairy Reason for referral: acute urinary retention  History of Present Illness: 76 yo male with hx BPH followed by Dr. Aldean Ast in the past, and now  post CABG, with iatrogenic foley cath trauma ( presumed from pt pulling on foley cath), and post op clot retention. He was seen by Dr. Isabel Caprice, with 14 F catheter removed and  placement of a 62F Coude cath Friday night, with removal Saturday. However, the pt was noted to void 100/hr, and was noted by Dr. Lowella Fairy th have ARF, with increase in Cr. From baseline 1.7 to 2.16. PVR at 9:00pm was noted to be 650cc, and Urology was consulted at 10pm for repeat catheterization. Reason for removing catheter on Sat. Afternoon is unknown, and reason for waiting until 10pm Sunday to replace catheter is because pt was voiding spontaneously-although in small amounts.   Past Medical History  Diagnosis Date  . Hypertension 12/2010    was initially orthostatic, but now was back to being hypotensive  . Campath-induced atrial fibrillation     with a stable rate, on no medications  . Syncope     Near-syncope, etiology is deemed secondary to the excessive heat  . Benign prostatic hypertrophy     but stable  . Chronic anticoagulation   . Coronary artery disease   . Mitral regurgitation   . Shortness of breath   . Chronic kidney disease 12/2010    with a creatinine on discharge of 1.84, was  2.1 on admission  . Calculus of kidney   . H/O hiatal hernia    Past Surgical History  Procedure Date  . Circumcision 08/12/2002    because of phimosis  . Coronary artery bypass graft 06/06/2011    Procedure: CORONARY ARTERY BYPASS GRAFTING (CABG);  Surgeon: [name], MD;  Location[facility] OR;  Service: Open Heart Surgery;  Laterality: N/A;  grafts times three using left internal mammary artery and right leg greater saphenous vein harvested endoscopically  . Maze 06/06/2011    Procedure: MAZE;  Surgeon: [name], MD;   Location: [facility] OR;  Service: Open Heart Surgery;  Laterality: N/A;  . Mitral valve repair 06/06/2011    Procedure: MITRAL VALVE REPAIR (MVR);  Surgeon: [name], MD;  Location: [facility] OR;  Service: Open Heart Surgery;  Laterality: N/A;    Medications: I have reviewed the patient's current medications.  Allergies: No Known Allergies  Family History  Problem Relation Age of Onset  . Cancer Father   . Lung cancer Father   . Diabetes Father   . Hypertension Mother   . Hypertension Brother     Social History:  reports that he has quit smoking. He does not have any smokeless tobacco history on file. He reports that he does not drink alcohol or use illicit drugs.  @ROS @  Physical Exam:  Vital signs in last 24 hours: Temp:  [97 F (36.1 C)-98.3 F (36.8 C)] 97.7 F (36.5 C) (01/06 2000) Pulse Rate:  [51-86] 79  (01/06 2100) Resp:  [3-22] 12  (01/06 2100) BP: (98-149)/(61-108) 140/96 mmHg (01/06 2100) SpO2:  [93 %-100 %] 97 % (01/06 2100) FiO2 (%):  [2 %] 2 % (01/06 1500) Weight:  [82.4 kg (181 lb 10.5 oz)] 181 lb 10.5 oz (82.4 kg) (01/06 0600) @PHYSEXAMBYAGE2 @  Laboratory Data:  Results for orders placed during the hospital encounter of 06/06/11 (from the past 72 hour(s))  GLUCOSE, CAPILLARY     Status: Abnormal   Collection Time  06/07/11 11:47 PM      Component Value Range Comment   Glucose-Capillary 154 (*) 70 - 99 (mg/dL)   POCT I-STAT 3, BLOOD GAS (G3+)     Status: Abnormal   Collection Time   06/08/11  1:40 AM      Component Value Range Comment   pH, Arterial 7.533 (*) 7.350 - 7.450     pCO2 arterial 23.4 (*) 35.0 - 45.0 (mmHg)    pO2, Arterial 61.0 (*) 80.0 - 100.0 (mmHg)    Bicarbonate 19.7 (*) 20.0 - 24.0 (mEq/L)    TCO2 20  0 - 100 (mmol/L)    O2 Saturation 94.0      Acid-base deficit 2.0  0.0 - 2.0 (mmol/L)    Patient temperature 98.6 F      Collection site RADIAL, ALLEN'S TEST ACCEPTABLE      Drawn by Nurse      Sample type ARTERIAL     POCT I-STAT, CHEM 8      Status: Abnormal   Collection Time   06/08/11  1:45 AM      Component Value Range Comment   Sodium 146 (*) 135 - 145 (mEq/L)    Potassium 3.0 (*) 3.5 - 5.1 (mEq/L)    Chloride 112  96 - 112 (mEq/L)    BUN 21  6 - 23 (mg/dL)    Creatinine, Ser 4.78 (*) 0.50 - 1.35 (mg/dL)    Glucose, Bld 295 (*) 70 - 99 (mg/dL)    Calcium, Ion 6.21  1.12 - 1.32 (mmol/L)    TCO2 20  0 - 100 (mmol/L)    Hemoglobin 13.3  13.0 - 17.0 (g/dL)    HCT 30.8  65.7 - 84.6 (%)   GLUCOSE, CAPILLARY     Status: Abnormal   Collection Time   06/08/11  4:10 AM      Component Value Range Comment   Glucose-Capillary 137 (*) 70 - 99 (mg/dL)   BASIC METABOLIC PANEL     Status: Abnormal   Collection Time   06/08/11  4:57 AM      Component Value Range Comment   Sodium 139  135 - 145 (mEq/L)    Potassium 4.0  3.5 - 5.1 (mEq/L)    Chloride 107  96 - 112 (mEq/L)    CO2 24  19 - 32 (mEq/L)    Glucose, Bld 144 (*) 70 - 99 (mg/dL)    BUN 25 (*) 6 - 23 (mg/dL)    Creatinine, Ser 9.62 (*) 0.50 - 1.35 (mg/dL)    Calcium 9.6  8.4 - 10.5 (mg/dL)    GFR calc non Af Amer 38 (*) >90 (mL/min)    GFR calc Af Amer 44 (*) >90 (mL/min)   CBC     Status: Abnormal   Collection Time   06/08/11  4:57 AM      Component Value Range Comment   WBC 21.1 (*) 4.0 - 10.5 (K/uL)    RBC 3.31 (*) 4.22 - 5.81 (MIL/uL)    Hemoglobin 10.3 (*) 13.0 - 17.0 (g/dL)    HCT 95.2 (*) 84.1 - 52.0 (%)    MCV 90.3  78.0 - 100.0 (fL)    MCH 31.1  26.0 - 34.0 (pg)    MCHC 34.4  30.0 - 36.0 (g/dL)    RDW 32.4  40.1 - 02.7 (%)    Platelets 77 (*) 150 - 400 (K/uL) CONSISTENT WITH PREVIOUS RESULT  GLUCOSE, CAPILLARY     Status: Abnormal   Collection  Time   06/08/11  7:40 AM      Component Value Range Comment   Glucose-Capillary 134 (*) 70 - 99 (mg/dL)    Comment 1 Documented in Chart      Comment 2 Notify RN     GLUCOSE, CAPILLARY     Status: Abnormal   Collection Time   06/08/11 12:02 PM      Component Value Range Comment   Glucose-Capillary 140 (*) 70 - 99  (mg/dL)    Comment 1 Documented in Chart      Comment 2 Notify RN     GLUCOSE, CAPILLARY     Status: Abnormal   Collection Time   06/08/11  4:36 PM      Component Value Range Comment   Glucose-Capillary 153 (*) 70 - 99 (mg/dL)    Comment 1 Documented in Chart      Comment 2 Notify RN     GLUCOSE, CAPILLARY     Status: Abnormal   Collection Time   06/08/11  7:54 PM      Component Value Range Comment   Glucose-Capillary 146 (*) 70 - 99 (mg/dL)    Comment 1 Documented in Chart      Comment 2 Notify RN     GLUCOSE, CAPILLARY     Status: Abnormal   Collection Time   06/09/11 12:30 AM      Component Value Range Comment   Glucose-Capillary 160 (*) 70 - 99 (mg/dL)   BASIC METABOLIC PANEL     Status: Abnormal   Collection Time   06/09/11  3:30 AM      Component Value Range Comment   Sodium 141  135 - 145 (mEq/L)    Potassium 3.9  3.5 - 5.1 (mEq/L)    Chloride 107  96 - 112 (mEq/L)    CO2 24  19 - 32 (mEq/L)    Glucose, Bld 141 (*) 70 - 99 (mg/dL)    BUN 31 (*) 6 - 23 (mg/dL)    Creatinine, Ser 1.61 (*) 0.50 - 1.35 (mg/dL)    Calcium 9.6  8.4 - 10.5 (mg/dL)    GFR calc non Af Amer 49 (*) >90 (mL/min)    GFR calc Af Amer 56 (*) >90 (mL/min)   CBC     Status: Abnormal   Collection Time   06/09/11  3:30 AM      Component Value Range Comment   WBC 20.8 (*) 4.0 - 10.5 (K/uL)    RBC 3.43 (*) 4.22 - 5.81 (MIL/uL)    Hemoglobin 10.4 (*) 13.0 - 17.0 (g/dL)    HCT 09.6 (*) 04.5 - 52.0 (%)    MCV 91.5  78.0 - 100.0 (fL)    MCH 30.3  26.0 - 34.0 (pg)    MCHC 33.1  30.0 - 36.0 (g/dL)    RDW 40.9  81.1 - 91.4 (%)    Platelets 82 (*) 150 - 400 (K/uL) CONSISTENT WITH PREVIOUS RESULT  PROTIME-INR     Status: Abnormal   Collection Time   06/09/11  3:30 AM      Component Value Range Comment   Prothrombin Time 19.7 (*) 11.6 - 15.2 (seconds)    INR 1.64 (*) 0.00 - 1.49    GLUCOSE, CAPILLARY     Status: Abnormal   Collection Time   06/09/11  3:56 AM      Component Value Range Comment   Glucose-Capillary  148 (*) 70 - 99 (mg/dL)   GLUCOSE, CAPILLARY  Status: Abnormal   Collection Time   06/09/11  7:53 AM      Component Value Range Comment   Glucose-Capillary 144 (*) 70 - 99 (mg/dL)    Comment 1 Notify RN     GLUCOSE, CAPILLARY     Status: Abnormal   Collection Time   06/09/11 11:59 AM      Component Value Range Comment   Glucose-Capillary 133 (*) 70 - 99 (mg/dL)    Comment 1 Notify RN     GLUCOSE, CAPILLARY     Status: Abnormal   Collection Time   06/09/11  5:55 PM      Component Value Range Comment   Glucose-Capillary 172 (*) 70 - 99 (mg/dL)   GLUCOSE, CAPILLARY     Status: Abnormal   Collection Time   06/09/11  8:00 PM      Component Value Range Comment   Glucose-Capillary 173 (*) 70 - 99 (mg/dL)   GLUCOSE, CAPILLARY     Status: Abnormal   Collection Time   06/09/11 11:48 PM      Component Value Range Comment   Glucose-Capillary 129 (*) 70 - 99 (mg/dL)   GLUCOSE, CAPILLARY     Status: Abnormal   Collection Time   06/10/11  4:06 AM      Component Value Range Comment   Glucose-Capillary 118 (*) 70 - 99 (mg/dL)   PROTIME-INR     Status: Abnormal   Collection Time   06/10/11  5:45 AM      Component Value Range Comment   Prothrombin Time 17.8 (*) 11.6 - 15.2 (seconds)    INR 1.44  0.00 - 1.49    BASIC METABOLIC PANEL     Status: Abnormal   Collection Time   06/10/11  5:45 AM      Component Value Range Comment   Sodium 143  135 - 145 (mEq/L)    Potassium 4.0  3.5 - 5.1 (mEq/L)    Chloride 107  96 - 112 (mEq/L)    CO2 24  19 - 32 (mEq/L)    Glucose, Bld 124 (*) 70 - 99 (mg/dL)    BUN 54 (*) 6 - 23 (mg/dL) DELTA CHECK NOTED   Creatinine, Ser 2.16 (*) 0.50 - 1.35 (mg/dL) DELTA CHECK NOTED   Calcium 9.9  8.4 - 10.5 (mg/dL)    GFR calc non Af Amer 29 (*) >90 (mL/min)    GFR calc Af Amer 33 (*) >90 (mL/min)   CBC     Status: Abnormal   Collection Time   06/10/11  5:45 AM      Component Value Range Comment   WBC 19.7 (*) 4.0 - 10.5 (K/uL)    RBC 3.91 (*) 4.22 - 5.81 (MIL/uL)     Hemoglobin 11.8 (*) 13.0 - 17.0 (g/dL)    HCT 29.5 (*) 62.1 - 52.0 (%)    MCV 91.3  78.0 - 100.0 (fL)    MCH 30.2  26.0 - 34.0 (pg)    MCHC 33.1  30.0 - 36.0 (g/dL)    RDW 30.8  65.7 - 84.6 (%)    Platelets 137 (*) 150 - 400 (K/uL)   GLUCOSE, CAPILLARY     Status: Normal   Collection Time   06/10/11  7:26 AM      Component Value Range Comment   Glucose-Capillary 97  70 - 99 (mg/dL)   GLUCOSE, CAPILLARY     Status: Abnormal   Collection Time   06/10/11 12:29 PM  Component Value Range Comment   Glucose-Capillary 132 (*) 70 - 99 (mg/dL)    Comment 1 Notify RN     GLUCOSE, CAPILLARY     Status: Abnormal   Collection Time   06/10/11  4:43 PM      Component Value Range Comment   Glucose-Capillary 130 (*) 70 - 99 (mg/dL)   GLUCOSE, CAPILLARY     Status: Abnormal   Collection Time   06/10/11  7:52 PM      Component Value Range Comment   Glucose-Capillary 119 (*) 70 - 99 (mg/dL)    Recent Results (from the past 240 hour(s))  SURGICAL PCR SCREEN     Status: Normal   Collection Time   06/04/11  8:38 AM      Component Value Range Status Comment   MRSA, PCR NEGATIVE  NEGATIVE  Final    Staphylococcus aureus NEGATIVE  NEGATIVE  Final    Creatinine: Physical exam:Gen: Awake, but poorly responsive wm, in NAD.   ABD: bladder palpable to the umbilicus.  Penis uncircumcised, and normal. Scrotum normal.    Impression/Assessment: Acute retention and acute renal failure. He will need foley catheter replacement.   Plan:  Foley catheter placement at bedside.   Kaylla Cobos I 06/10/2011, 10:06 PM

## 2011-06-10 NOTE — Op Note (Cosign Needed Addendum)
Pre-operative diagnosis : Acute Urinary Retention  Postoperative diagnosis: Same  Operation:Bedside placement of 45F Coude Urinary catheter  Surgeon:  S. Patsi Sears, MD  Anesthesia: Elyse Jarvis  Preparation:After discussion with the RN, and the patient, the penis was prepped with Betadine solution and draped in the usual fashion.   Review history:76 yo male with 650cc recurrent AUR, with hx BPH, and more recent post op foley trauma with clot retention.   Statement of  Likelihood of Success: Excellent. TIME-OUT observed.:  Procedure: KY gel was inserted into the urethra with a 10cc syringe. A 20 F coude catheter was then passed atraumatically through the prostate, and over the bladder neck, into the bladder. Clear, straw colored urine was obtained. ( volume pending). Urometer bag was attached. Pt will have Cr. In AM. Pt will be irrigated prn.    Nursing Note: after a total off 35 minutes, 775cc of tea colored urine returned; Pt states considerable amount of relief s/p foley placement. Pt tolerated procedure.

## 2011-06-11 LAB — GLUCOSE, CAPILLARY
Glucose-Capillary: 104 mg/dL — ABNORMAL HIGH (ref 70–99)
Glucose-Capillary: 106 mg/dL — ABNORMAL HIGH (ref 70–99)
Glucose-Capillary: 122 mg/dL — ABNORMAL HIGH (ref 70–99)
Glucose-Capillary: 151 mg/dL — ABNORMAL HIGH (ref 70–99)
Glucose-Capillary: 164 mg/dL — ABNORMAL HIGH (ref 70–99)

## 2011-06-11 LAB — BASIC METABOLIC PANEL
BUN: 62 mg/dL — ABNORMAL HIGH (ref 6–23)
Calcium: 9.6 mg/dL (ref 8.4–10.5)
GFR calc non Af Amer: 30 mL/min — ABNORMAL LOW (ref >90)
Glucose, Bld: 115 mg/dL — ABNORMAL HIGH (ref 70–99)

## 2011-06-11 LAB — CBC
MCH: 30.1 pg (ref 26.0–34.0)
MCHC: 33.2 g/dL (ref 30.0–36.0)
Platelets: 136 10*3/uL — ABNORMAL LOW (ref 150–400)

## 2011-06-11 MED ORDER — WARFARIN SODIUM 2 MG PO TABS
2.0000 mg | ORAL_TABLET | Freq: Once | ORAL | Status: AC
  Administered 2011-06-11: 2 mg via ORAL
  Filled 2011-06-11: qty 1

## 2011-06-11 MED FILL — Sodium Chloride Irrigation Soln 0.9%: Qty: 3000 | Status: AC

## 2011-06-11 MED FILL — Electrolyte-R (PH 7.4) Solution: INTRAVENOUS | Qty: 5000 | Status: AC

## 2011-06-11 MED FILL — Sodium Chloride IV Soln 0.9%: INTRAVENOUS | Qty: 1000 | Status: AC

## 2011-06-11 MED FILL — Heparin Sodium (Porcine) Inj 1000 Unit/ML: INTRAMUSCULAR | Qty: 60 | Status: AC

## 2011-06-11 NOTE — Progress Notes (Signed)
Patient ID: Troy Gregory, male   DOB: 11-13-1935, 76 y.o.   MRN: [id number] TCTS DAILY PROGRESS NOTE                   [address]            [place] [address]          [phone]      5 Days Post-Op Procedure(s) (LRB): CORONARY ARTERY BYPASS GRAFTING (CABG) (N/A) MAZE (N/A) MITRAL VALVE REPAIR (MVR) (N/A)  Total Length of Stay:  LOS: 5 days   Subjective: Mental status improving, walking better.  Objective: Vital signs in last 24 hours: Patient Vitals for the past 24 hrs:  BP Temp Temp src Pulse Resp SpO2 Weight  06/11/11 1300 147/84 mmHg - - 71  10  95 % -  06/11/11 1208 - 97.5 F (36.4 C) Oral - - - -  06/11/11 1200 139/79 mmHg - - 54  17  99 % -  06/11/11 1100 126/72 mmHg - - 69  14  100 % -  06/11/11 1000 124/102 mmHg - - 55  18  100 % -  06/11/11 0900 140/85 mmHg - - 50  18  99 % -  06/11/11 0800 138/83 mmHg - - 65  17  99 % -  06/11/11 0714 - 98 F (36.7 C) Oral - - - -  06/11/11 0700 147/76 mmHg - - 53  19  100 % -  06/11/11 0600 138/114 mmHg - - 59  18  98 % 180 lb 5.4 oz (81.8 kg)  06/11/11 0500 145/114 mmHg - - 63  13  100 % -  06/11/11 0400 144/104 mmHg - - 65  17  97 % -  06/11/11 0300 150/95 mmHg - - 70  12  95 % -  06/11/11 0200 132/100 mmHg - - 49  14  96 % -  06/11/11 0100 156/92 mmHg - - 80  8  96 % -  06/11/11 0003 - 97.5 F (36.4 C) Oral - - - -  06/11/11 0000 141/105 mmHg - - 79  12  95 % -  06/10/11 2300 122/105 mmHg - - 79  15  94 % -  06/10/11 2200 148/88 mmHg - - 81  14  97 % -  06/10/11 2100 140/96 mmHg - - 79  12  97 % -  06/10/11 2000 138/107 mmHg 97.7 F (36.5 C) Oral 79  16  98 % -  06/10/11 1900 145/94 mmHg - - 86  21  98 % -  06/10/11 1800 139/97 mmHg - - 80  12  97 % -  06/10/11 1700 129/95 mmHg - - 81  19  96 % -  06/10/11 1645 - 97.6 F (36.4 C) Oral - - - -  06/10/11 1600 148/104 mmHg - - 81  20  100 % -  06/10/11 1500 149/108 mmHg - - 80  19  100 % -  06/10/11 1400 112/81 mmHg - - - 9  - -   Wt Readings  from Last 3 Encounters:  06/11/11 180 lb 5.4 oz (81.8 kg)  06/11/11 180 lb 5.4 oz (81.8 kg)  06/04/11 173 lb 1 oz (78.5 kg)      Intake/Output from previous day: 01/06 0701 - 01/07 0700 In: 2043 [P.O.:2040; I.V.:3] Out: 1595 [Urine:1595]  Intake/Output this shift: Total I/O In: 180 [P.O.:180] Out: 495 [Urine:495]  Current Meds: Scheduled Meds:   . acetaminophen  1,000 mg Oral Q6H   Or  . acetaminophen (TYLENOL) oral liquid 160 mg/5 mL  975 mg Per Tube Q6H  . bisacodyl  10 mg Oral Daily   Or  . bisacodyl  10 mg Rectal Daily  . docusate sodium  200 mg Oral Daily  . dutasteride  0.5 mg Oral Daily  . insulin aspart  0-24 Units Subcutaneous Q4H  . pantoprazole  40 mg Oral Q1200  . simvastatin  20 mg Oral Daily  . sodium chloride  3 mL Intravenous Q12H  . warfarin  2 mg Oral ONCE-1800  . warfarin  2.5 mg Oral ONCE-1800  . DISCONTD: amiodarone  400 mg Oral BID  . DISCONTD: furosemide  40 mg Oral Daily  . DISCONTD: potassium chloride  20 mEq Oral Daily   Continuous Infusions:   . sodium chloride Stopped (06/06/11 1700)  . sodium chloride    . sodium chloride    . insulin (NOVOLIN-R) infusion 0.6 mL/hr at 06/06/11 2100  . DISCONTD: lactated ringers 20 mL/hr (06/09/11 0237)   PRN Meds:.metoprolol, morphine, ondansetron (ZOFRAN) IV, oxyCODONE, sodium chloride  General appearance: alert, cooperative and slowed mentation Neurologic: intact Heart: regular rate and rhythm and no rub Lungs: diminished breath sounds bibasilar Abdomen: soft, non-tender; bowel sounds normal; no masses,  no organomegaly Extremities: extremities normal, atraumatic, no cyanosis or edema, Homans sign is negative, no sign of DVT and no edema, redness or tenderness in the calves or thighs Wound: stable sternum Foley back in   Lab Results: CBC: Basename 06/11/11 0445 06/10/11 0545  WBC 15.2* 19.7*  HGB 10.8* 11.8*  HCT 32.5* 35.7*  PLT 136* 137*   BMET:  Basename 06/11/11 0445 06/10/11 0545    NA 141 143  K 3.5 4.0  CL 106 107  CO2 25 24  GLUCOSE 115* 124*  BUN 62* 54*  CREATININE 2.10* 2.16*  CALCIUM 9.6 9.9    PT/INR:  Basename 06/11/11 0445  LABPROT 18.1*  INR 1.47   Radiology: No results found.   Assessment/Plan: S/P Procedure(s) (LRB): CORONARY ARTERY BYPASS GRAFTING (CABG) (N/A) MAZE (N/A) MITRAL VALVE REPAIR (MVR) (N/A) Required Foley back in, with urinary retention last pm Continued chronic renal insufficiency cr=2.1, hope will decrease with relief of urinary retention. Avoiding ACE for now with increased Cr Started back on  Coumadin   [name] MD  Beeper [phone] Office [phone] 06/11/2011 1:26 PM

## 2011-06-11 NOTE — Progress Notes (Signed)
Observed pt ambulating with nsg this am. Will check back this pm as time allows.  Ivonne Andrew PT, DPT [phone]

## 2011-06-12 ENCOUNTER — Inpatient Hospital Stay (HOSPITAL_COMMUNITY): Payer: Medicare Other

## 2011-06-12 LAB — GLUCOSE, CAPILLARY
Glucose-Capillary: 102 mg/dL — ABNORMAL HIGH (ref 70–99)
Glucose-Capillary: 109 mg/dL — ABNORMAL HIGH (ref 70–99)
Glucose-Capillary: 120 mg/dL — ABNORMAL HIGH (ref 70–99)
Glucose-Capillary: 134 mg/dL — ABNORMAL HIGH (ref 70–99)
Glucose-Capillary: 92 mg/dL (ref 70–99)
Glucose-Capillary: 94 mg/dL (ref 70–99)

## 2011-06-12 LAB — CBC
HCT: 30.6 % — ABNORMAL LOW (ref 39.0–52.0)
Hemoglobin: 10 g/dL — ABNORMAL LOW (ref 13.0–17.0)
MCH: 29.7 pg (ref 26.0–34.0)
MCHC: 32.7 g/dL (ref 30.0–36.0)
MCV: 90.8 fL (ref 78.0–100.0)
Platelets: 119 10*3/uL — ABNORMAL LOW (ref 150–400)
RBC: 3.37 MIL/uL — ABNORMAL LOW (ref 4.22–5.81)
RDW: 13.7 % (ref 11.5–15.5)
WBC: 8.6 10*3/uL (ref 4.0–10.5)

## 2011-06-12 LAB — BASIC METABOLIC PANEL
BUN: 44 mg/dL — ABNORMAL HIGH (ref 6–23)
CO2: 27 mEq/L (ref 19–32)
Calcium: 8.6 mg/dL (ref 8.4–10.5)
Chloride: 106 mEq/L (ref 96–112)
Creatinine, Ser: 1.79 mg/dL — ABNORMAL HIGH (ref 0.50–1.35)
GFR calc Af Amer: 42 mL/min — ABNORMAL LOW (ref >90)
GFR calc non Af Amer: 36 mL/min — ABNORMAL LOW (ref >90)
Glucose, Bld: 95 mg/dL (ref 70–99)
Potassium: 3.4 mEq/L — ABNORMAL LOW (ref 3.5–5.1)
Sodium: 141 mEq/L (ref 135–145)

## 2011-06-12 LAB — PROTIME-INR: Prothrombin Time: 21.8 seconds — ABNORMAL HIGH (ref 11.6–15.2)

## 2011-06-12 MED ORDER — ASPIRIN EC 81 MG PO TBEC
81.0000 mg | DELAYED_RELEASE_TABLET | Freq: Every day | ORAL | Status: DC
  Administered 2011-06-12 – 2011-06-19 (×8): 81 mg via ORAL
  Filled 2011-06-12 (×9): qty 1

## 2011-06-12 MED ORDER — TRAMADOL HCL 50 MG PO TABS
50.0000 mg | ORAL_TABLET | Freq: Four times a day (QID) | ORAL | Status: DC
  Administered 2011-06-12 – 2011-06-13 (×5): 50 mg via ORAL
  Filled 2011-06-12 (×5): qty 1

## 2011-06-12 MED ORDER — CARVEDILOL 6.25 MG PO TABS
6.2500 mg | ORAL_TABLET | Freq: Two times a day (BID) | ORAL | Status: DC
  Administered 2011-06-12 – 2011-06-17 (×12): 6.25 mg via ORAL
  Filled 2011-06-12 (×18): qty 1

## 2011-06-12 MED ORDER — GUAIFENESIN ER 600 MG PO TB12
600.0000 mg | ORAL_TABLET | Freq: Two times a day (BID) | ORAL | Status: DC | PRN
  Filled 2011-06-12: qty 1

## 2011-06-12 MED ORDER — WARFARIN SODIUM 2 MG PO TABS
2.0000 mg | ORAL_TABLET | Freq: Once | ORAL | Status: AC
  Administered 2011-06-12: 2 mg via ORAL
  Filled 2011-06-12 (×2): qty 1

## 2011-06-12 MED ORDER — DOCUSATE SODIUM 100 MG PO CAPS
200.0000 mg | ORAL_CAPSULE | Freq: Every day | ORAL | Status: DC
  Administered 2011-06-13 – 2011-06-19 (×6): 200 mg via ORAL
  Filled 2011-06-12 (×7): qty 2

## 2011-06-12 MED ORDER — MOVING RIGHT ALONG BOOK
Freq: Once | Status: AC
  Administered 2011-06-12: 08:00:00
  Filled 2011-06-12: qty 1

## 2011-06-12 MED ORDER — POVIDONE-IODINE 10 % EX SOLN
1.0000 "application " | Freq: Two times a day (BID) | CUTANEOUS | Status: DC
  Administered 2011-06-12 – 2011-06-19 (×15): 1 via TOPICAL
  Filled 2011-06-12: qty 15

## 2011-06-12 MED ORDER — PANTOPRAZOLE SODIUM 40 MG PO TBEC
40.0000 mg | DELAYED_RELEASE_TABLET | Freq: Every day | ORAL | Status: DC
  Administered 2011-06-12 – 2011-06-19 (×8): 40 mg via ORAL
  Filled 2011-06-12 (×8): qty 1

## 2011-06-12 MED ORDER — ONDANSETRON HCL 4 MG PO TABS: 4.0000 mg | ORAL_TABLET | Freq: Four times a day (QID) | ORAL | Status: DC | PRN

## 2011-06-12 MED ORDER — SIMVASTATIN 20 MG PO TABS
20.0000 mg | ORAL_TABLET | Freq: Every day | ORAL | Status: DC
  Administered 2011-06-12 – 2011-06-18 (×7): 20 mg via ORAL
  Filled 2011-06-12 (×8): qty 1

## 2011-06-12 MED ORDER — SODIUM CHLORIDE 0.9 % IV SOLN: 250.0000 mL | INTRAVENOUS | Status: DC | PRN

## 2011-06-12 MED ORDER — ACETAMINOPHEN 325 MG PO TABS
650.0000 mg | ORAL_TABLET | Freq: Four times a day (QID) | ORAL | Status: DC | PRN
  Administered 2011-06-14 – 2011-06-18 (×2): 650 mg via ORAL
  Filled 2011-06-12 (×2): qty 2

## 2011-06-12 MED ORDER — SODIUM CHLORIDE 0.9 % IJ SOLN
3.0000 mL | Freq: Two times a day (BID) | INTRAMUSCULAR | Status: DC
Start: 2011-06-12 — End: 2011-06-19
  Administered 2011-06-12 – 2011-06-19 (×15): 3 mL via INTRAVENOUS

## 2011-06-12 MED ORDER — SODIUM CHLORIDE 0.9 % IJ SOLN: 3.0000 mL | INTRAMUSCULAR | Status: DC | PRN

## 2011-06-12 MED ORDER — BISACODYL 5 MG PO TBEC: 10.0000 mg | DELAYED_RELEASE_TABLET | Freq: Every day | ORAL | Status: DC | PRN

## 2011-06-12 MED ORDER — BISACODYL 10 MG RE SUPP: 10.0000 mg | Freq: Every day | RECTAL | Status: DC | PRN

## 2011-06-12 MED ORDER — ONDANSETRON HCL 4 MG/2ML IJ SOLN: 4.0000 mg | Freq: Four times a day (QID) | INTRAMUSCULAR | Status: DC | PRN

## 2011-06-12 MED ORDER — INSULIN ASPART 100 UNIT/ML ~~LOC~~ SOLN
0.0000 [IU] | Freq: Three times a day (TID) | SUBCUTANEOUS | Status: DC
  Administered 2011-06-12: 2 [IU] via SUBCUTANEOUS
  Administered 2011-06-13: 4 [IU] via SUBCUTANEOUS
  Administered 2011-06-13 – 2011-06-14 (×2): 2 [IU] via SUBCUTANEOUS

## 2011-06-12 NOTE — Progress Notes (Signed)
Physical Therapy Note: Pt with discontinue PT order. Signing off Toney Sang, Sherrill [phone]

## 2011-06-12 NOTE — Progress Notes (Signed)
Patient ID: Troy Gregory, male   DOB: [birth year], 76 y.o.   MRN: [id number] TCTS DAILY PROGRESS NOTE                   [address]            [place] [address]          [phone]      6 Days Post-Op Procedure(s) (LRB): CORONARY ARTERY BYPASS GRAFTING (CABG) (N/A) MAZE (N/A) MITRAL VALVE REPAIR (MVR) (N/A)  Total Length of Stay:  LOS: 6 days   Subjective: Continues to improve, much less confused  Objective: Vital signs in last 24 hours: Patient Vitals for the past 24 hrs:  BP Temp Temp src Pulse Resp SpO2 Weight  06/12/11 0737 - 98 F (36.7 C) Oral - - - -  06/12/11 0700 149/88 mmHg - - 68  15  99 % -  06/12/11 0600 - - - 70  26  100 % 179 lb 7.3 oz (81.4 kg)  06/12/11 0500 141/89 mmHg - - 76  14  100 % -  06/12/11 0400 124/84 mmHg - - 74  11  100 % -  06/12/11 0300 93/56 mmHg - - 70  11  100 % -  06/12/11 0200 119/53 mmHg - - 67  12  100 % -  06/12/11 0100 111/71 mmHg - - 68  12  99 % -  06/12/11 0000 144/72 mmHg 98.3 F (36.8 C) Oral 67  13  99 % -  06/11/11 2300 109/67 mmHg - - 69  13  100 % -  06/11/11 2200 133/70 mmHg - - 84  12  98 % -  06/11/11 2100 129/88 mmHg - - 77  15  100 % -  06/11/11 2000 119/82 mmHg - - 68  21  99 % -  06/11/11 1937 - 97.6 F (36.4 C) Oral - - - -  06/11/11 1900 - - - 72  16  99 % -  06/11/11 1800 151/96 mmHg - - 59  22  100 % -  06/11/11 1700 - - - 61  16  100 % -  06/11/11 1619 - 97.2 F (36.2 C) Oral - - - -  06/11/11 1600 145/74 mmHg - - 58  12  100 % -  06/11/11 1500 124/77 mmHg - - 76  13  94 % -  06/11/11 1400 147/84 mmHg - - 63  14  99 % -  06/11/11 1300 147/84 mmHg - - 71  10  95 % -  06/11/11 1208 - 97.5 F (36.4 C) Oral - - - -  06/11/11 1200 139/79 mmHg - - 54  17  99 % -  06/11/11 1100 126/72 mmHg - - 69  14  100 % -  06/11/11 1000 124/102 mmHg - - 55  18  100 % -  06/11/11 0900 140/85 mmHg - - 50  18  99 % -  06/11/11 0800 138/83 mmHg - - 65  17  99 % -   Wt Readings from Last 3 Encounters:    06/12/11 179 lb 7.3 oz (81.4 kg)  06/12/11 179 lb 7.3 oz (81.4 kg)  06/04/11 173 lb 1 oz (78.5 kg)   Weight change: -14.1 oz (-0.4 kg)       Intake/Output from previous day: 01/07 0701 - 01/08 0700 In: 720 [P.O.:720] Out: 2065 [Urine:2065]      Current Meds: Scheduled Meds:   . acetaminophen  1,000 mg Oral Q6H   Or  . acetaminophen (TYLENOL) oral liquid 160 mg/5 mL  975 mg Per Tube Q6H  . bisacodyl  10 mg Oral Daily   Or  . bisacodyl  10 mg Rectal Daily  . docusate sodium  200 mg Oral Daily  . dutasteride  0.5 mg Oral Daily  . insulin aspart  0-24 Units Subcutaneous Q4H  . pantoprazole  40 mg Oral Q1200  . simvastatin  20 mg Oral Daily  . sodium chloride  3 mL Intravenous Q12H  . warfarin  2 mg Oral ONCE-1800   Continuous Infusions:   . sodium chloride Stopped (06/06/11 1700)  . sodium chloride    . sodium chloride    . insulin (NOVOLIN-R) infusion 0.6 mL/hr at 06/06/11 2100   PRN Meds:.metoprolol, morphine, ondansetron (ZOFRAN) IV, oxyCODONE, sodium chloride  General appearance: alert and cooperative Neurologic: intact Heart: regular rate and rhythm, S1, S2 normal, no murmur, click, rub or gallop Lungs: diminished breath sounds bibasilar Abdomen: soft, non-tender; bowel sounds normal; no masses,  no organomegaly Extremities: extremities normal, atraumatic, no cyanosis or edema Wound: sternum stable Foley in  Lab Results: CBC: Basename 06/12/11 0417 06/11/11 0445  WBC 8.6 15.2*  HGB 10.0* 10.8*  HCT 30.6* 32.5*  PLT 119* 136*   BMET:  Basename 06/12/11 0417 06/11/11 0445  NA 141 141  K 3.4* 3.5  CL 106 106  CO2 27 25  GLUCOSE 95 115*  BUN 44* 62*  CREATININE 1.79* 2.10*  CALCIUM 8.6 9.6    PT/INR:  Basename 06/12/11 0417  LABPROT 21.8*  INR 1.86*   Radiology: No results found.   Assessment/Plan: S/P Procedure(s) (LRB): CORONARY ARTERY BYPASS GRAFTING (CABG) (N/A) MAZE (N/A) MITRAL VALVE REPAIR (MVR) (N/A) Plan for transfer to  step-down: see transfer orders Keep foley in until ok with Urology Continue coumadin Cr at baseline add ACE when Cr stable    [name] MD  Beeper [phone] Office [phone] 06/12/2011 7:39 AM

## 2011-06-13 ENCOUNTER — Inpatient Hospital Stay (HOSPITAL_COMMUNITY): Payer: Medicare Other

## 2011-06-13 LAB — BASIC METABOLIC PANEL
BUN: 31 mg/dL — ABNORMAL HIGH (ref 6–23)
CO2: 24 mEq/L (ref 19–32)
Calcium: 8.8 mg/dL (ref 8.4–10.5)
Chloride: 103 mEq/L (ref 96–112)
Creatinine, Ser: 1.35 mg/dL (ref 0.50–1.35)
GFR calc Af Amer: 59 mL/min — ABNORMAL LOW (ref >90)
GFR calc non Af Amer: 50 mL/min — ABNORMAL LOW (ref >90)
Glucose, Bld: 105 mg/dL — ABNORMAL HIGH (ref 70–99)
Potassium: 3.4 mEq/L — ABNORMAL LOW (ref 3.5–5.1)
Sodium: 139 mEq/L (ref 135–145)

## 2011-06-13 LAB — CBC
HCT: 32.8 % — ABNORMAL LOW (ref 39.0–52.0)
Hemoglobin: 10.7 g/dL — ABNORMAL LOW (ref 13.0–17.0)
MCH: 29.8 pg (ref 26.0–34.0)
MCHC: 32.6 g/dL (ref 30.0–36.0)
MCV: 91.4 fL (ref 78.0–100.0)
Platelets: 171 10*3/uL (ref 150–400)
RBC: 3.59 MIL/uL — ABNORMAL LOW (ref 4.22–5.81)
RDW: 13.9 % (ref 11.5–15.5)
WBC: 11 10*3/uL — ABNORMAL HIGH (ref 4.0–10.5)

## 2011-06-13 LAB — GLUCOSE, CAPILLARY
Glucose-Capillary: 115 mg/dL — ABNORMAL HIGH (ref 70–99)
Glucose-Capillary: 163 mg/dL — ABNORMAL HIGH (ref 70–99)
Glucose-Capillary: 104 mg/dL — ABNORMAL HIGH (ref 70–99)
Glucose-Capillary: 129 mg/dL — ABNORMAL HIGH (ref 70–99)

## 2011-06-13 LAB — URINALYSIS, ROUTINE W REFLEX MICROSCOPIC
Glucose, UA: NEGATIVE mg/dL
Specific Gravity, Urine: 1.013 (ref 1.005–1.030)

## 2011-06-13 LAB — URINE MICROSCOPIC-ADD ON

## 2011-06-13 LAB — PROTIME-INR
INR: 1.68 — ABNORMAL HIGH (ref 0.00–1.49)
Prothrombin Time: 20.1 seconds — ABNORMAL HIGH (ref 11.6–15.2)

## 2011-06-13 MED ORDER — TRAMADOL HCL 50 MG PO TABS: 50.0000 mg | ORAL_TABLET | Freq: Four times a day (QID) | ORAL | Status: DC

## 2011-06-13 MED ORDER — FUROSEMIDE 40 MG PO TABS: 40.0000 mg | ORAL_TABLET | ORAL | Status: DC

## 2011-06-13 MED ORDER — LISINOPRIL 20 MG PO TABS: 10.0000 mg | ORAL_TABLET | ORAL | Status: DC

## 2011-06-13 MED ORDER — POTASSIUM CHLORIDE CRYS ER 20 MEQ PO TBCR: 20.0000 meq | EXTENDED_RELEASE_TABLET | Freq: Every day | ORAL | Status: DC

## 2011-06-13 MED ORDER — POTASSIUM CHLORIDE CRYS ER 20 MEQ PO TBCR
20.0000 meq | EXTENDED_RELEASE_TABLET | Freq: Every day | ORAL | Status: DC
  Administered 2011-06-14: 20 meq via ORAL
  Filled 2011-06-13 (×3): qty 1

## 2011-06-13 MED ORDER — DUTASTERIDE 0.5 MG PO CAPS
0.5000 mg | ORAL_CAPSULE | Freq: Every day | ORAL | Status: DC
  Administered 2011-06-13 – 2011-06-19 (×7): 0.5 mg via ORAL
  Filled 2011-06-13 (×8): qty 1

## 2011-06-13 MED ORDER — FUROSEMIDE 40 MG PO TABS
40.0000 mg | ORAL_TABLET | Freq: Every day | ORAL | Status: DC
  Administered 2011-06-13 – 2011-06-14 (×2): 40 mg via ORAL
  Filled 2011-06-13 (×3): qty 1

## 2011-06-13 MED ORDER — WARFARIN SODIUM 2.5 MG PO TABS
2.5000 mg | ORAL_TABLET | Freq: Every day | ORAL | Status: DC
  Administered 2011-06-13 – 2011-06-16 (×4): 2.5 mg via ORAL
  Filled 2011-06-13 (×5): qty 1

## 2011-06-13 MED ORDER — SIMVASTATIN 20 MG PO TABS: 20.0000 mg | ORAL_TABLET | Freq: Every day | ORAL | Status: DC

## 2011-06-13 MED ORDER — CARVEDILOL 6.25 MG PO TABS: 6.2500 mg | ORAL_TABLET | Freq: Two times a day (BID) | ORAL | Status: DC

## 2011-06-13 MED ORDER — GUAIFENESIN ER 600 MG PO TB12: 600.0000 mg | ORAL_TABLET | Freq: Two times a day (BID) | ORAL | Status: DC | PRN

## 2011-06-13 MED ORDER — ASPIRIN 81 MG PO TBEC: 81.0000 mg | DELAYED_RELEASE_TABLET | Freq: Every day | ORAL | Status: DC

## 2011-06-13 MED ORDER — POTASSIUM CHLORIDE CRYS ER 20 MEQ PO TBCR
40.0000 meq | EXTENDED_RELEASE_TABLET | Freq: Once | ORAL | Status: AC
  Administered 2011-06-13: 40 meq via ORAL

## 2011-06-13 NOTE — Progress Notes (Signed)
   CARE MANAGEMENT NOTE 06/13/2011  Patient:  Troy Gregory, Troy Gregory   Account Number:  [id number]  Date Initiated:  06/07/2011  Documentation initiated by:  [facility]  Subjective/Objective Assessment:   Admitted to ICU post op CABG x3, MAZE and MVR.  Has spouse.     Action/Plan:   PTA, PT INDEPENDENT, LIVES WITH SPOUSE.   Anticipated DC Date:  06/15/2011   Anticipated DC Plan:  HOME W HOME HEALTH SERVICES      DC Planning Services  CM consult      [name]  HOME HEALTH   Choice offered to / List presented to:  [name]        HH arranged  HH-1 RN  HH-2 PT      [facility] agency  [facility]   Status of service:  In process, will continue to follow Medicare Important Message given?   (If response is "NO", the following Medicare IM given date fields will be blank) Date Medicare IM given:   Date Additional Medicare IM given:    Discharge Disposition:    Per UR Regulation:  Reviewed for med. necessity/level of care/duration of stay  Comments:  06/13/11 [name],RN,BSN 1100 MET WITH PT AND FAMILY TO FINALIZE DC PLANS.  PT STILL WITH MILD INTERMITTENT CONFUSION, THOUGH MUCH IMPROVED.  PT HAS FOLEY CATHETER, AND MAY NEED TO DC HOME WITH IT.  WILL ARRANGE HHRN FOR RESTORATIVE CARE AND HHPT AS RECOMMENDED BY CARDIAC REHAB.  WILL RESUME PHYSICAL THERAPY SERVICES WHILE IN HOUSE.  PT HAS RW AND BSC AT HOME, PER WIFE.  WILL FOLLOW. Phone #[phone]   06-12-11 1:45am [name], RNBSN - [phone] UR Completed.  06/06/10 [name],RN,BSN 1609 MET WITH PT AND WIFE TO DISCUSS DC PLANS.  PT QUITE CONFUSED AND AGITATED, IN RESTRAINTS AND WITH SITTER AT BEDSIDE.  WIFE STATES PT HAS "A FEW EPISODES" OF CONFUSION AT HOME WHEN HE HAD TROUBLE BREATHING, BUT NEVER TO THIS EXTENT.  DISCUSSED OPTIONS FOR DISPOSITION...WIFE OPEN TO CONSIDERING SNF FOR REHAB, IF NEEDED, THOUGH WE ARE HOPEFUL THIS CONFUSION WILL CLEAR QUICKLY.  WILL CONT TO FOLLOW PROGRESS. Phone  #[phone]   06-07-11 10:53am [name], RNBSN - [phone] UR completed.

## 2011-06-13 NOTE — Progress Notes (Signed)
CARDIAC REHAB PHASE I   PRE:  Rate/Rhythm: 81 SR PAC's  BP:  Supine: 120/80  Sitting:   Standing:    SaO2: 95 RA  MODE:  Ambulation: 240 ft   POST:  Rate/Rhythem: 76 SR PAC's  BP:  Supine:   Sitting: 106/80  Standing:    SaO2: 93 RA 1050-1135 Assisted X 2 and used walker to ambulate. He leans forward with walking and gets very distracted. Pt looks in rooms is disoriented to place and time.Very hard to keep him focused on walking. To recliner after walk with call light in reach and family present.  Beatrix Fetters

## 2011-06-13 NOTE — Progress Notes (Signed)
Pt complained of pressure and pain in bladder area. Bladder scan revealed 287 ml of fluid in the bladder. The foley was repostined and the bladder was scanned again  and revealed 163 ml of fluid in the bladder. The pt stated signs of relief. Will continue to monitor.   Pegah Segel [employer]

## 2011-06-13 NOTE — Progress Notes (Addendum)
7 Days Post-Op Procedure(s) (LRB): CORONARY ARTERY BYPASS GRAFTING (CABG) (N/A) MAZE (N/A) MITRAL VALVE REPAIR (MVR) (N/A)  Subjective: Patient without complaints this am.  Objective: Vital signs in last 24 hours: Patient Vitals for the past 24 hrs:  BP Temp Temp src Pulse Resp SpO2 Weight  06/13/11 0355 132/80 mmHg 97.4 F (36.3 C) Oral 87  16  97 % 184 lb 11.9 oz (83.8 kg)  06/12/11 2101 133/100 mmHg 97.3 F (36.3 C) Oral 90  20  96 % -  06/12/11 1750 120/90 mmHg - - - - - -  06/12/11 1300 123/100 mmHg - - 86  - 98 % -  06/12/11 1200 109/67 mmHg - - 84  16  99 % -  06/12/11 1100 136/77 mmHg 97.6 F (36.4 C) Oral 63  13  100 % -  06/12/11 1000 169/120 mmHg - - 60  20  100 % -  06/12/11 0900 145/85 mmHg - - 111  17  100 % -  06/12/11 0800 145/85 mmHg - - 68  19  100 % -  06/12/11 0737 - 98 F (36.7 C) Oral - - - -   Pre op weight  78.5 kg Current Weight  06/13/11 184 lb 11.9 oz (83.8 kg)      Intake/Output from previous day: 01/08 0701 - 01/09 0700 In: 340 [P.O.:340] Out: 700 [Urine:700]   Physical Exam:  Cardiovascular: IRRR, IRRR; no murmurs, gallops, or rubs. Pulmonary: Slightly decreased at the bases L>R ; no rales, wheezes, or rhonchi. Abdomen: Soft, non tender, bowel sounds present. Extremities: Mild bilateral lower extremity edema. Wounds: Clean and dry.  No erythema or signs of infection.  Lab Results: CBC: Basename 06/13/11 0620 06/12/11 0417  WBC 11.0* 8.6  HGB 10.7* 10.0*  HCT 32.8* 30.6*  PLT 171 119*   BMET:  Basename 06/12/11 0417 06/11/11 0445  NA 141 141  K 3.4* 3.5  CL 106 106  CO2 27 25  GLUCOSE 95 115*  BUN 44* 62*  CREATININE 1.79* 2.10*  CALCIUM 8.6 9.6    PT/INR:  Basename 06/13/11 0620  LABPROT 20.1*  INR 1.68*   ABG:  INR: Will add last result for INR, ABG once components are confirmed Will add last 4 CBG results once components are confirmed  Assessment/Plan:  1. CV - Afib. Continue Coreg 6.25 bid and will increase  Coumadin as INR decreased from 1.86 to 1.68. Monitor HR as may need to increase BB or consider Amiodarone. 2.  Pulmonary - CXR this am shows some atelectasis at bases, trace left pleural effusion, no ptx. Encourage incentive spirometer. 3. Volume Overload - Would like to begin diuresis if Cr remains stable (BMET results pending) 4.  Acute blood loss anemia - H/H stable at 10.7/32.8. 5.Acute urinary retention-Foley to remain until urologist states otherwise. 6.HGA1C pre op is 6.1. CBGs have been 130 or less. Will need follow up as outpatient.   ZIMMERMAN,[name] 06/13/2011   still with foley in Urology is following,  more confused this morning but better then when was in the unit [facility] but need two people to help Will ask rehab to see to help with deconditioned state   I have seen and examined Troy Gregory and agree with the above assessment  and plan.  [name] MD Beeper [phone] Office [phone] 06/13/2011 3:49 PM

## 2011-06-13 NOTE — Discharge Summary (Signed)
[address]            [place] [address]          [phone]      Troy Gregory Dec 22, 1935 76 y.o. [id number]  06/06/2011   Troy Ovens, MD  CAD  History of Present Illness:  Patient is 76 yo with no known history of CAD presents with 100 lb weight lass over two years, onset of atrial fib in may 2012, and two documented syncopal episodes in July and Nov of this Year. He has had over past several months progressive SOB with an with out exertion, PND frequently, 2 pillow ortho[pnea. He denies lower extremity edema or chest pain. He has no history of prior MI. He was referred to Dr. Sheliah Gregory for consideration of surgery. Dr. Tyrone Gregory recommended coronary artery bypass grafting as well as placement of an atrial clip and possible maze procedure. Additionally consideration of placement of a mitral valve ring would be undertaken based on intraoperative findings and that of TEE.  Current Activity/ Functional Status: At time of consultation Patient is independent with mobility/ambulation, transfers, ADL's, IADL's.  Past Medical History   Diagnosis  Date   .  Hypertension  12/2010     was initially orthostatic, but now was back to being hypotensive   .  Campath-induced atrial fibrillation  May 2012     with a stable rate, on no medications   .  Syncope  Two episodes July and Nov episode in Nov precipitated Cardiology evaluation       .  Benign prostatic hypertrophy  Elevated PSA to 13 by patient history bx done "several" years ago     but stable   .  Chronic anticoagulation    .  Coronary artery disease  New DX since cath   .  Mitral regurgitation    .  Shortness of breath    .  Chronic kidney disease  12/2010     with a creatinine on discharge of 1.84, was 2.1 on admission   .  Calculus of kidney    .  H/O hiatal hernia     Past Surgical History   Procedure  Date   .  Circumcision  08/12/2002     because of phimosis   Prostate BX    Family History   Problem  Relation  Age of Onset        .  Lung cancer  Father  80   .  Diabetes  Father    .  Hypertension- died in sleep age 74 on coumadin for AFIB  Mother    .  Hypertension  Brother    Daughter with lupus  History    Social History   .  Marital Status:  Married          Number of Children:  2   .  Years of Education:  N/A    Occupational History   .  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  Smoking status:  Former Smoker --quit age 46   .     Marland Kitchen  Alcohol Use:  No   .  Drug Use:  No    Other Topics  Concern   .  Not on file    Social History Narrative   .  No narrative on file   No Known Allergies  Current Outpatient Prescriptions   Medication  Sig  Dispense  Refill   .  dutasteride (AVODART) 0.5 MG capsule  Take 0.5 mg by mouth daily.     .  furosemide (LASIX) 80 MG tablet  Take 40 mg by mouth every morning.     Marland Kitchen  lisinopril (PRINIVIL,ZESTRIL) 20 MG tablet  Take 20 mg by mouth every morning.     .  warfarin (COUMADIN) 2 MG tablet  Take 4-6 mg by mouth every evening. Take 4mg  daily, then on day 5 take 6mg .     .  atorvastatin (LIPITOR) 40 MG tablet  Take 40 mg by mouth every morning.     .  nitroGLYCERIN (NITROSTAT) 0.4 MG SL tablet  Place 0.4 mg under the tongue every 5 (five) minutes as needed. FOR CHEST PAIN     Review of Systems at time of consultation Constitutional: Positive for weight loss and malaise/fatigue. Negative for fever, chills and diaphoresis.  Eyes: Negative.  Respiratory: Positive for cough and shortness of breath. Negative for wheezing.  Cardiovascular: Positive for palpitations, orthopnea and PND. Negative for chest pain and leg swelling.  Gastrointestinal: Negative for heartburn, nausea, abdominal pain, diarrhea, constipation, blood in stool and melena.  Genitourinary: Negative.  Musculoskeletal: Positive for joint pain.  Skin: Negative for itching and rash.  Neurological:  Positive for dizziness, loss of consciousness and weakness. Negative for focal weakness and seizures.  Psychiatric/Behavioral: Negative.  Regular dental care  No history of colonoscopy  Physical Exam: At time of consultation  BP 144/109  Pulse 92  Temp(Src) 97.7 F (36.5 C) (Oral)  Resp 16  Wt 173 lb (78.472 kg)  SpO2 97%  General appearance: alert, cooperative, appears older than stated age, cachectic and no distress  Neurologic: intact no carotid bruits  Heart: irregularly irregular rhythm and no rub  Lungs: clear to auscultation bilaterally  Abdomen: soft, non-tender; bowel sounds normal; no masses, no organomegaly  Extremities: extremities normal, atraumatic, no cyanosis or edema, Homans sign is negative, no sign of DVT, no edema, redness or tenderness in the calves or thighs and no ulcers, gangrene or trophic changes  Palpable distal pulses in feet bilateral  No palpable AAA  Diagnostic Studies & Laboratory data:  Recent Radiology Findings:  Dg Chest 2 View  06/04/2011 *RADIOLOGY REPORT* Clinical Data: Preop evaluation. History of coronary artery disease. History of mitral valvular prolapse. For CABG and mitral valvular repair. History of hypertension and atrial fibrillation. History of hiatal hernia. CHEST - 2 VIEW Comparison: 04/22/2011. Findings: There is slight enlargement of the cardiac silhouette. Mediastinal and hilar contours appear stable. No pulmonary edema, pneumonia, or pleural effusion is seen. There is osteophyte formation in the spine. IMPRESSION: Slight enlargement of the cardiac silhouette. No pulmonary edema, pneumonia, or other acute abnormality is seen. Original Report Authenticated By: Troy Gregory, M.D.    Recent Lab Findings/studies prior to surgery:    Lab Results   Component  Value  Date    WBC  7.0  06/04/2011    HGB  14.8  06/04/2011    HCT  44.0  06/04/2011    PLT  153  06/04/2011    GLUCOSE  180*  06/04/2011    ALT  27  06/04/2011    AST  28   06/04/2011    NA  140  06/04/2011    K  3.3*  06/04/2011    CL  102  06/04/2011  CREATININE  1.60*  06/04/2011    BUN  22  06/04/2011    CO2  24  06/04/2011    TSH  1.38  05/21/2011    INR  1.53*  06/04/2011    HGBA1C  6.1*  06/04/2011   Echo 05/16/11  Transthoracic Echocardiography  Patient: Troy Gregory, Troy Gregory MR #: [id number] Study Date: 05/16/2011 Gender: M Age: 50 Height: 182.9cm Weight: 80.3kg BSA: 2.96m^2 Pt. Status: Room:  ATTENDING Peter Sw[place], MD [facility] Peter Swazil[place], MD REFERRING Peter Swaziland, MD PERFORMING [facility] SONOGRAPHER Junious Dresser, RDCS cc:  ------------------------------------------------------------ LV EF: 20% - 25%  ------------------------------------------------------------ Indications: Atrial fibrillation - 427.31. Re-evaluate LV function.  ------------------------------------------------------------ History: PMH: Acquired from the patient and from the patient's chart. Fatigue, syncope, and dyspnea. Atrial fibrillation. Left ventricular dysfunction, with an ejection fraction of 23%by radionuclide ventriculography. Mild mitral regurgitation. Renal disease. Risk factors: Hypertension.  ------------------------------------------------------------ Study Conclusions  - Left ventricle: The cavity size was normal. There was mild focal basal hypertrophy of the septum. Systolic function was severely reduced. The estimated ejection fraction was in the range of 20% to 25%. Diffuse hypokinesis. - Aortic valve: Trivial regurgitation. - Mitral valve: Moderate regurgitation. - Left atrium: The atrium was moderately dilated. - Right ventricle: The cavity size was mildly dilated. - Right atrium: The atrium was mildly dilated. - Pericardium, extracardiac: A trivial pericardial effusion was identified.  ------------------------------------------------------------ Labs, prior tests, procedures, and surgery: Echocardiography (July 2012).  The mitral valve showed mild regurgitation. EF was 55%.  Stress nuclear imaging (December 2012). The study demonstrated marked LV dilation with severe global dysfunction. EF was 23%. Transthoracic echocardiography. M-mode, limited 2D, limited spectral Doppler, and color Doppler. Height: Height: 182.9cm. Height: 72in. Weight: Weight: 80.3kg. Weight: 176.6lb. Body mass index: BMI: 24kg/m^2. Body surface area: BSA: 2.21m^2. Blood pressure: 138/94. Patient status: Outpatient. Location: [facility]  ------------------------------------------------------------  ------------------------------------------------------------ Left ventricle: The cavity size was normal. There was mild focal basal hypertrophy of the septum. Systolic function was severely reduced. The estimated ejection fraction was in the range of 20% to 25%. Diffuse hypokinesis.  ------------------------------------------------------------ Aortic valve: Trileaflet; mildly thickened leaflets. Doppler: There was no stenosis. Trivial regurgitation.  ------------------------------------------------------------ Aorta: Aortic root: The aortic root was normal in size.  ------------------------------------------------------------ Mitral valve: Structurally normal valve. Leaflet separation was normal. Doppler: Transvalvular velocity was within the normal range. There was no evidence for stenosis. Moderate regurgitation.  ------------------------------------------------------------ Left atrium: The atrium was moderately dilated.  ------------------------------------------------------------ Right ventricle: The cavity size was mildly dilated. Systolic function was low normal.  ------------------------------------------------------------ Pulmonic valve: The valve appears to be grossly normal. Doppler: Mild regurgitation.  ------------------------------------------------------------ Tricuspid valve: Structurally normal  valve. Leaflet separation was normal. Doppler: Transvalvular velocity was within the normal range. Mild regurgitation.  ------------------------------------------------------------ Right atrium: The atrium was mildly dilated.  ------------------------------------------------------------ Pericardium: A trivial pericardial effusion was identified.  ------------------------------------------------------------ Systemic veins: Inferior vena cava: The vessel was normal in size; the respirophasic diameter changes were in the normal range (= 50%); findings are consistent with normal central venous pressure.  ------------------------------------------------------------  2D measurements Normal Left ventricle LVID ED, 51 mm 43-52 chord, PLAX LVID ES, 48 mm 23-38 chord, PLAX FS, chord, 6 % >29 PLAX LVPW, ED 9 mm ------ IVS/LVPW 1.44 <1.3 ratio, ED Ventricular septum IVS, ED 13 mm ------ LVOT Diam, S 21 mm ------ Area 3.46 cm^2 ------ Aorta Root diam, 37 mm ------ ED Left atrium AP dim 42 mm ------ AP dim 2.08 cm/m^2 <2.2 index  ------------------------------------------------------------  Prepared and Electronically Authenticated by  Olga Millers, MD, [facility] 2012-12-12T10:34:39.227  Cardiac Cath:  Cardiac Cath Note  Troy Gregory  [id number]  Apr 02, 1936  Procedure: Right and left Heart Cardiac Catheterization Note  Indications: Congestive heart failure  Procedure Details  Consent: Obtained  Time Out: Verified patient identification, verified procedure, site/side was marked, verified correct patient position, special equipment/implants available, Radiology Safety Procedures followed, medications/allergies/relevent history reviewed, required imaging and test results available. Performed  The right femoral artery was easily canulated using a modified Seldinger technique.  Hemodynamics:  RA: 12/11/9  RV: 38/11  PCWP: 25/24/20 ( prominent V waves suggestive of TR)  PA:  42/21/30  Cardiac Output  Thermodilution: 2.5 lpm, index of 1.2  Fick : 3.7 lpm, index 1.8  Arterial Sat: 92  PA Sat: 57.  LV pressure: 103/18  Aortic pressure: 101/81  Angiography : It was very difficult to cannulate the left main. We ended up using a Judkins left 5 catheter. It is very difficult to maintain catheter position. We used 60 cc of contrast for angiography  Left Main: The left main has mild irregularities in the proximal and midsegment. There is a 70% stenosis in the distal left main. This stenosis actually continues for and involves the origin of the left circumflex artery and left anterior descending artery.  Left anterior Descending: There's a 70% irregular stenosis in the origin of the left anterior descending artery. The stenosis actually involves the left main and origin of the left circumflex artery. The LAD has moderate irregularities throughout its course. It gives off a large first diagonal artery that has mild regularities. The second diagonal artery has a 50-60% stenosis at its proximal segment. There is a discrete 60-70% stenosis in the distal left anterior descending artery.  There is a large septal perforator branch that has a 70-80% stenosis at its origin. This septal perforator provides most of the filling to the intraventricular septum.  Left Circumflex: The left complex artery is relatively small vessel. There is a 70% stenosis at its origin. There is a 80-90% stenosis in the distal circumflex artery just prior to giving off a small obtuse marginal branch.  Right Coronary Artery: Right coronary artery was also very difficult to engage. There is a hazy stenosis at the origin of the right coronary artery that is associated with a 20-30% stenosis at its origin. The right coronary artery is very dilated and ectatic. The posterior descending artery has a 40-50% stenosis at its origin.  LV Gram: No left ventriculogram was performed because of a creatinine of 2.0. We measured  pressures in the left ventricle and then an LV/AO pullback.  Complications: No apparent complications  Patient did tolerate procedure well.  Conclusions:  1. Coronary artery disease: The patient has significant coronary artery disease involving the left main, left anterior descending artery, and left circumflex artery. These lesions are nonmovable 2 percutaneous coronary intervention and. We'll consult the CV surgeons.  2. Congestive heart failure: The patient has a known ejection fraction of 20-25% by echo. His cardiac output is moderately to severely depressed by Fick and thermodilution calculation.  3. Chronic renal insufficiency: The patient's baseline creatinine is 2.0. We tried to use a minimal amount of contrast. We used 60 cc of contrast during the case. We had considerable difficulty keeping the cath was engaged in the coronary arteries.  [name], [name]., MD, [facility]  05/25/2011, 10:53 AM    The patient was deemed to be exceptional for proceeding and was  admitted to the hospital. On 06/06/2011 she underwent the following procedure:  OPERATIVE REPORT  PREOPERATIVE DIAGNOSES: Severe ischemic cardiomyopathy, with left main  coronary obstruction, moderate-to-severe mitral regurgitation secondary  to annular dilatation, atrial fibrillation.  POSTOPERATIVE DIAGNOSES: Severe ischemic cardiomyopathy, with left main  coronary obstruction, moderate-to-severe mitral regurgitation secondary  to annular dilatation, atrial fibrillation.  SURGICAL PROCEDURE PERFORMED:  1. Coronary artery bypass grafting x3 with the left internal mammary  to the left anterior descending coronary artery, reverse saphenous  vein graft to the intermediate coronary artery, reverse saphenous  vein graft to the distal right coronary artery.  2. Left-sided MAZE procedure.  3. Mitral valve repair with ring annuloplasty with a Sorin ring model  #SMT 30, serial # [id number].  4. Right leg endo vein harvesting.  5.  Closure of left atrial appendage.  SURGEON: Troy Plane, MD  FIRST ASSISTANT: Rowe Clack, PA-C.  SECOND ASSISTANT: Cecile Sheerer, [place].  He tolerated this procedure well and was taken to the surgical intensive care unit in stable condition  Postoperative Hospital Course:    The patient has progressed nicely. He was weaned from the ventilator without difficulty. He has remained hemodynamically stable. He has had postoperative atrial fibrillation. Currently his rate is controlled. He has been restarted on Coumadin. He initially did have some difficulty with confusion and slowed mentation but this is improved over time. Additionally has had some trouble with urinary retention. He did have an acute renal insufficiency which has slowly resolved. Urology consultation has been obtained and he currently maintains a Foley. Decision will be determined as to whether it'll be discontinued prior to discharge home but he will need ongoing followup for his benign prostatic hyperplasia. All routine lines monitors and drainage devices have otherwise been discontinued in the standard fashion. Ambulation is steadily improving and he is tolerating routine cardiac rehabilitation modalities. Incisions are healing without evidence of infection. He has a moderate volume overload but is responding well to diuresis which will continue for a short-term as an outpatient. As his creatinine has improved we will restart his ACE inhibitor at one half preoperative dose. This may require further titration as an outpatient. His preoperative hemoglobin A1c is 6.1 and his capillary blood glucose is under good control. He will need outpatient followup. Currently his status is tentatively felt to be stable for discharge in the morning pending reevaluation at that time.  Basename 06/13/11 0620 06/12/11 0417  NA 139 141  K 3.4* 3.4*  CL 103 106  CO2 24 27  GLUCOSE 105* 95  BUN 31* 44*  CALCIUM 8.8 8.6    Basename 06/13/11 0620  06/12/11 0417  WBC 11.0* 8.6  HGB 10.7* 10.0*  HCT 32.8* 30.6*  PLT 171 119*    Basename 06/13/11 0620 06/12/11 0417  INR 1.68* 1.86*     Discharge Instructions:  The patient is discharged to home with extensive instructions on wound care and progressive ambulation.  They are instructed not to drive or perform any heavy lifting until returning to see the physician in his office.  Discharge Diagnosis:  CAD  Secondary Diagnosis: Patient Active Problem List  Diagnoses  . Atrial fibrillation  . Syncope  . Chronic kidney disease  . Hypertension  . Dyspnea  . Cardiomyopathy, ischemic   Past Medical History  Diagnosis Date  . Hypertension 12/2010    was initially orthostatic, but now was back to being hypotensive  . Atrial fibrillation     with a stable rate, on no medications  .  Syncope     Near-syncope, etiology is deemed secondary to the excessive heat  . Benign prostatic hypertrophy     but stable  . Chronic anticoagulation   . Coronary artery disease   . Mitral regurgitation   . Shortness of breath   . Chronic kidney disease 12/2010    with a creatinine on discharge of 1.84, was  2.1 on admission  . Calculus of kidney   . H/O hiatal hernia        Troy Gregory, Troy Gregory  Home Medication Instructions [id number]   Printed on:06/13/11 1011  Medication Information                    dutasteride (AVODART) 0.5 MG capsule Take 0.5 mg by mouth daily.            warfarin (COUMADIN) 2 MG tablet Take 4-6 mg by mouth every evening. Take 4mg  daily, then on day 5 take 6mg .           aspirin EC 81 MG EC tablet Take 1 tablet (81 mg total) by mouth daily.           carvedilol (COREG) 6.25 MG tablet Take 1 tablet (6.25 mg total) by mouth 2 (two) times daily with a meal.           furosemide (LASIX) 40 MG tablet Take 1 tablet (40 mg total) by mouth every morning.           guaiFENesin (MUCINEX) 600 MG 12 hr tablet Take 1 tablet (600 mg total) by mouth every 12 (twelve) hours  as needed.           lisinopril (PRINIVIL,ZESTRIL) 20 MG tablet Take 0.5 tablets (10 mg total) by mouth every morning.           simvastatin (ZOCOR) 20 MG tablet Take 1 tablet (20 mg total) by mouth daily at 6 PM.           traMADol (ULTRAM) 50 MG tablet Take 1 tablet (50 mg total) by mouth every 6 (six) hours.           potassium chloride SA (K-DUR,KLOR-CON) 20 MEQ tablet Take 1 tablet (20 mEq total) by mouth daily.             Disposition: Discharged home  Patient's condition is Good  Gershon Crane, PA-C 06/13/2011  10:11 AM

## 2011-06-14 LAB — GLUCOSE, CAPILLARY
Glucose-Capillary: 113 mg/dL — ABNORMAL HIGH (ref 70–99)
Glucose-Capillary: 118 mg/dL — ABNORMAL HIGH (ref 70–99)
Glucose-Capillary: 115 mg/dL — ABNORMAL HIGH (ref 70–99)
Glucose-Capillary: 150 mg/dL — ABNORMAL HIGH (ref 70–99)

## 2011-06-14 LAB — PROTIME-INR
INR: 1.64 — ABNORMAL HIGH (ref 0.00–1.49)
Prothrombin Time: 19.7 seconds — ABNORMAL HIGH (ref 11.6–15.2)

## 2011-06-14 NOTE — Progress Notes (Signed)
Thank you for consult on Troy Gregory.  Chart reviewed and note that patient was  ambulating with nursing post op and that physical therapy was discontinued.  Note that recent increased assistance appears to be due to patent's confusion causing distraction.  Would recommend PT consult for evaluation of needs.  Will follow up past evaluation.

## 2011-06-14 NOTE — Progress Notes (Signed)
Physical Therapy Evaluation Patient Details Name: Troy Gregory MRN: [id number] DOB: [birth year] Today's Date: 06/14/2011  Problem List:  Patient Active Problem List  Diagnoses  . Atrial fibrillation  . Syncope  . Chronic kidney disease  . Hypertension  . Dyspnea  . Cardiomyopathy, ischemic    Past Medical History:  Past Medical History  Diagnosis Date  . Hypertension 12/2010    was initially orthostatic, but now was back to being hypotensive  . Atrial fibrillation     with a stable rate, on no medications  . Syncope     Near-syncope, etiology is deemed secondary to the excessive heat  . Benign prostatic hypertrophy     but stable  . Chronic anticoagulation   . Coronary artery disease   . Mitral regurgitation   . Shortness of breath   . Chronic kidney disease 12/2010    with a creatinine on discharge of 1.84, was  2.1 on admission  . Calculus of kidney   . H/O hiatal hernia    Past Surgical History:  Past Surgical History  Procedure Date  . Circumcision 08/12/2002    because of phimosis  . Coronary artery bypass graft 06/06/2011    Procedure: CORONARY ARTERY BYPASS GRAFTING (CABG);  Surgeon: [name], MD;  Location: [facility] OR;  Service: Open Heart Surgery;  Laterality: N/A;  grafts times three using left internal mammary artery and right leg greater saphenous vein harvested endoscopically  . Maze 06/06/2011    Procedure: MAZE;  Surgeon: [name], MD;  Location: [facility] OR;  Service: Open Heart Surgery;  Laterality: N/A;  . Mitral valve repair 06/06/2011    Procedure: MITRAL VALVE REPAIR (MVR);  Surgeon: [name], MD;  Location: [facility] OR;  Service: Open Heart Surgery;  Laterality: N/A;    PT Assessment/Plan/Recommendation PT Assessment Clinical Impression Statement: Pt s/p CABG and MVR with AMS since admission. Pt will benefit from therapy to maximize mobility and independence adhering to precautions before discharge to decrease burden of care. Mobility limited  today secondary to HR, RN aware and pt received meds immediately prior to eval, pt asymptomatic. PT Recommendation/Assessment: Patient will need skilled PT in the acute care venue PT Problem List: Decreased strength;Decreased cognition;Decreased activity tolerance;Decreased mobility;Decreased knowledge of precautions;Decreased safety awareness;Decreased knowledge of use of DME Barriers to Discharge: Decreased caregiver support Barriers to Discharge Comments: Unsure how much assist wife can provide PT Therapy Diagnosis : Difficulty walking;Abnormality of gait;Altered mental status PT Plan PT Frequency: Min 3X/week PT Treatment/Interventions: Gait training;DME instruction;Stair training;Functional mobility training;Therapeutic activities;Therapeutic exercise;Patient/family education;Cognitive remediation PT Recommendation Recommendations for Other Services: OT consult;Speech consult Follow Up Recommendations: Supervision for mobility/OOB;Home health PT (If wife unable to provide assist for mobility, SNF ) Equipment Recommended: None recommended by PT PT Goals  Acute Rehab PT Goals PT Goal Formulation: With patient Time For Goal Achievement: 2 weeks Pt will go Supine/Side to Sit: with supervision PT Goal: Supine/Side to Sit - Progress: Other (comment) Pt will go Sit to Stand: with supervision PT Goal: Sit to Stand - Progress: Other (comment) Pt will Ambulate: >150 feet;with supervision;with least restrictive assistive device PT Goal: Ambulate - Progress: Other (comment) Pt will Go Up / Down Stairs: 1-2 stairs;with min assist;with least restrictive assistive device PT Goal: Up/Down Stairs - Progress: Other (comment) Additional Goals Additional Goal #1: Pt will independently state and adhere to 3/3 sternal precautions PT Goal: Additional Goal #1 - Progress: Other (comment)  PT Evaluation Precautions/Restrictions  Precautions Precautions: Sternal;Fall Prior Functioning  Home Living Lives  With: Spouse Receives Help From: Family Type of Home: House Home Layout: One level Home Access: Stairs to enter Entrance Stairs-Rails: None Entrance Stairs-Number of Steps: 1 Bathroom Shower/Tub: Health visitor: Standard Home Adaptive Equipment: Built-in shower seat;Bedside commode/3-in-1;Walker - rolling;Straight cane Prior Function Level of Independence: Independent with basic ADLs;Independent with homemaking with ambulation;Independent with gait Driving: Yes Vocation: Retired Comments: pt states wife available 24 hrs a day unsure of how much assist she can provide Cognition Cognition Arousal/Alertness: Awake/alert Overall Cognitive Status: Impaired Attention: Impaired Current Attention Level: Sustained Memory: Appears impaired Memory Deficits: Unable to recall situation, instructions, or tasks discussed Orientation Level: Oriented to person;Disoriented to place;Disoriented to time;Disoriented to situation Following Commands: Follows one step commands consistently Safety/Judgement: Decreased awareness of safety precautions Decreased Safety/Judgement: Decreased awareness of need for assistance Awareness of Deficits: Decreased awareness of deficits Sensation/Coordination Sensation Light Touch: Appears Intact Stereognosis: Not tested Hot/Cold: Not tested Proprioception: Not tested Extremity Assessment RLE Assessment RLE Assessment: Within Functional Limits LLE Assessment LLE Assessment: Within Functional Limits Mobility (including Balance) Bed Mobility Rolling Right: 4: Min assist Rolling Right Details (indicate cue type and reason): cueing for sequence and precautions Right Sidelying to Sit: 4: Min assist;HOB flat Right Sidelying to Sit Details (indicate cue type and reason): cueing to sequence with precautions Sitting - Scoot to Edge of Bed: 5: Supervision Sitting - Scoot to [employer] of Bed Details (indicate cue type and reason): cues to reciprocally scoot  rather than use bil UE Transfers Sit to Stand: 4: Min assist Sit to Stand Details (indicate cue type and reason): cueing for hands on thighs and sequence Stand to Sit: 5: Supervision Stand to Sit Details: cueing for sequence and safety Ambulation/Gait Ambulation/Gait Assistance: 4: Min assist Ambulation/Gait Assistance Details (indicate cue type and reason): minguard for safety and cues to step into RW and extend posture Ambulation Distance (Feet): 30 Feet Assistive device: Rolling walker Gait Pattern: Trunk flexed;Decreased stride length Stairs: No  Posture/Postural Control Posture/Postural Control: Postural limitations Postural Limitations: flexed trunk Static Sitting Balance Static Sitting - Balance Support: Feet supported Static Sitting - Level of Assistance: 6: Modified independent (Device/Increase time) Exercise  General Exercises - Lower Extremity Long Arc Quad: AROM;Both;15 reps;Seated Hip Flexion/Marching: AROM;Both;Other reps (comment);Seated (20reps) End of Session PT - End of Session Equipment Utilized During Treatment: Gait belt Activity Tolerance: Treatment limited secondary to medical complications (Comment) (HR 120-130 with limited activity) Patient left: in chair;with call bell in reach;Other (comment) (chair alarm on) Nurse Communication: Mobility status for transfers General Behavior During Session: [facility] for tasks performed Cognition: Impaired  Delorse Lek 06/14/2011, 9:09 AM Toney Sang, PT [phone]

## 2011-06-14 NOTE — Progress Notes (Addendum)
8 Days Post-Op Procedure(s) (LRB): CORONARY ARTERY BYPASS GRAFTING (CABG) (N/A) MAZE (N/A) MITRAL VALVE REPAIR (MVR) (N/A)  Subjective: Patient continues to have confusion. Apparently, he pulled out his foley last evening. He had several hundred ccs of residual by bladder scan. Has condom cath on with a fair amount of urine in bag.He is oriented to person and place this am.  Objective: Vital signs in last 24 hours: Patient Vitals for the past 24 hrs:  BP Temp Temp src Pulse Resp SpO2 Weight  06/14/11 0834 - - - 124  - - -  06/14/11 0615 155/114 mmHg 98.6 F (37 C) Oral 118  16  94 % 183 lb 3.2 oz (83.1 kg)  06/13/11 2102 155/104 mmHg 98.4 F (36.9 C) Oral 99  16  99 % -  06/13/11 1412 121/85 mmHg 97.7 F (36.5 C) Oral 72  19  99 % -   Pre op weight  78.5 kg Current Weight  06/14/11 183 lb 3.2 oz (83.1 kg)      Intake/Output from previous day: 01/09 0701 - 01/10 0700 In: 480 [P.O.:480] Out: 850 [Urine:850]   Physical Exam:  Cardiovascular: RRR; no murmurs, gallops, or rubs. Pulmonary: Slightly decreased at the bases L>R ; no rales, wheezes, or rhonchi. Abdomen: Soft, non tender, bowel sounds present. Extremities: Mild bilateral lower extremity edema. Wounds: Clean and dry.  No erythema or signs of infection.  Lab Results: CBC:  Basename 06/13/11 0620 06/12/11 0417  WBC 11.0* 8.6  HGB 10.7* 10.0*  HCT 32.8* 30.6*  PLT 171 119*   BMET:   Basename 06/13/11 0620 06/12/11 0417  NA 139 141  K 3.4* 3.4*  CL 103 106  CO2 24 27  GLUCOSE 105* 95  BUN 31* 44*  CREATININE 1.35 1.79*  CALCIUM 8.8 8.6    PT/INR:   Basename 06/14/11 0615  LABPROT 19.7*  INR 1.64*   ABG:  INR: Will add last result for INR, ABG once components are confirmed Will add last 4 CBG results once components are confirmed  Assessment/Plan:  1. CV - PAfib. Continue Coreg 6.25 bid and Coumadin. HR  Has been as high as 140's but currently is in 70's. Monitor as may need to consider  Amiodarone. On Lisinopril pre op and may restart as SBP increasing. Await creatinine results in am prior to starting. 2.  Pulmonary -  Encourage incentive spirometer. 4.  Acute blood loss anemia - H/H stable at 10.7/32.8. 5.Acute urinary retention-Will bladder scan and ask urology to evaluate. 6.HGA1C pre op is 6.1. CBGs have been 130 or less. Will need follow up as outpatient. 7.Questionable etiology of confusion-Not on narcotics. 8.UA shows 3-6 WBC, numerous RBCs, and casts. Microscopic shows trace leukocytes, protein, and large amount of Hgb. Will not start antibiotics as likely no UTI.   ZIMMERMAN,[name] 06/14/2011    Chart reviewed, patient examined, agree with above. Would hold off on ACE Inhib with elevated creat.

## 2011-06-14 NOTE — Progress Notes (Signed)
Bladder scan done early this am revealed 400 cc. Patient has condom cath and there is urine in the bag.I called [facility] . Per Dr. Isabel Caprice, nurse to attempt to place an 18 French foley catheter with a 30 cc balloon (may use up to 40 cc of saline), and then tape to leg.  Nurse just informed me that the foley placement occurred without any complications and a fair amount of urine was obtained.  Urologist to see later today.

## 2011-06-14 NOTE — Progress Notes (Signed)
Pt complained of pain in his bladder area and lower. The area was assessed, his abdomen appeared to be distended. The patient also had this problem on 06/12/2011. This issue was resolved with deflating the balloon and repositioning the catheter. Tonight that was attempted, but met resistance and the balloon was unable to be inflated. Dr. Tyrone Sage was notified. He said to call urology. Dr.MacDiarmid was called, he said do not remove the catheter and that someone would see the patient at 8:30 the next morning. A few minutes later the pt pulled the catheter out, Dr. Sherron Monday was called and informed. He said that it appears the patient does not want the foley and to leave it out. Bladder scan revealed 488 mL of fluid. Pt is still complaining of pain in the bladder area and back. A condom catheter was placed on the pt and seems to be ok with the patient at this time. Will continue to monitor pt closely.   Troy Gregory

## 2011-06-14 NOTE — Progress Notes (Signed)
Bladder scan results 400 cc urine, will notify rounding PA on floor. [facility]

## 2011-06-14 NOTE — Progress Notes (Signed)
CARDIAC REHAB PHASE I   PRE:  Rate/Rhythm: 114 afib  BP:  Supine:   Sitting: 142/98  Standing:    SaO2: 97%RA  MODE:  Ambulation: 350 ft   POST:  Rate/Rhythem: 125 afib  BP:  Supine: 126/88  Sitting:   Standing:    SaO2: 99%RA 1325-1415 Pt cannot control urine. Condom cath off and gown and chair sheets soaked. Changed pt and assisted to [facility] prior to walk. Attempted to replace condom cath but it would not stay on. Comfort measures such as rubbing pt's back done prior to walk as he c/o back soreness. Walked 350 ft on RA with rolling walker and asst x 2. Needed cues to stand upright and close to walker. To bed after walk. As soon as he laid down he started to void a little. Notified pt's RN who stated needed to reinsert foley.  Duanne Limerick

## 2011-06-15 DIAGNOSIS — G92 Toxic encephalopathy: Secondary | ICD-10-CM

## 2011-06-15 DIAGNOSIS — R5381 Other malaise: Secondary | ICD-10-CM

## 2011-06-15 LAB — GLUCOSE, CAPILLARY
Glucose-Capillary: 106 mg/dL — ABNORMAL HIGH (ref 70–99)
Glucose-Capillary: 144 mg/dL — ABNORMAL HIGH (ref 70–99)
Glucose-Capillary: 102 mg/dL — ABNORMAL HIGH (ref 70–99)
Glucose-Capillary: 110 mg/dL — ABNORMAL HIGH (ref 70–99)

## 2011-06-15 LAB — CBC
HCT: 32.1 % — ABNORMAL LOW (ref 39.0–52.0)
Hemoglobin: 10.5 g/dL — ABNORMAL LOW (ref 13.0–17.0)
WBC: 8 10*3/uL (ref 4.0–10.5)

## 2011-06-15 LAB — PROTIME-INR
INR: 1.72 — ABNORMAL HIGH (ref 0.00–1.49)
Prothrombin Time: 20.5 seconds — ABNORMAL HIGH (ref 11.6–15.2)

## 2011-06-15 LAB — BASIC METABOLIC PANEL
BUN: 28 mg/dL — ABNORMAL HIGH (ref 6–23)
CO2: 26 mEq/L (ref 19–32)
Chloride: 104 mEq/L (ref 96–112)
Glucose, Bld: 101 mg/dL — ABNORMAL HIGH (ref 70–99)
Potassium: 3.2 mEq/L — ABNORMAL LOW (ref 3.5–5.1)

## 2011-06-15 MED ORDER — POTASSIUM CHLORIDE CRYS ER 20 MEQ PO TBCR
40.0000 meq | EXTENDED_RELEASE_TABLET | Freq: Two times a day (BID) | ORAL | Status: DC
  Administered 2011-06-15 – 2011-06-18 (×8): 40 meq via ORAL
  Filled 2011-06-15 (×9): qty 2

## 2011-06-15 NOTE — Progress Notes (Signed)
Pt ambulated 100 ft to new room assignement (2005) on RA with walker.  Tolerated well, no SOB or breaks.  Will continue to monitor and encourage. Ave Filter

## 2011-06-15 NOTE — Progress Notes (Signed)
CARDIAC REHAB PHASE I   PRE:  Rate/Rhythm: 83 SR    BP: sitting 120/70    SaO2: 98  RA  MODE:  Ambulation: 400 ft   POST:  Rate/Rhythm: 100 st    BP: sitting 120/72     SaO2: 98  RA  Pt much more oriented today. Used RW, assist x2. C/o back pain. Has trouble standing tall while walking. Needed constant VCs to stand straight. Got feet outside RW at first but able to correct with distance. Return to bed. In SR before walk but A fib after, controlled rate. [phone]  Harriet Masson CES, ACSM

## 2011-06-15 NOTE — Progress Notes (Signed)
[employer] will not approve inpatient acute rehabilitation. They will approve SNF for rehab or HH. Please call for any questions. Pager [phone].

## 2011-06-15 NOTE — Consult Note (Signed)
Physical Medicine and Rehabilitation Consult Reason for Consult: Deconditioning Referring Phsyician: Dr. Tyrone Sage    HPI: Troy Gregory is an 76 y.o. male with history of CAD, afib, episodes of syncope with progressive SOB and 2 pillow orthopnea due to severe ischemic CM with left main CA obstruction and moderate to severe mitral regurgitation.  Patient elected to undergo CABG X3 with left sided MAZE and MVR by Dr. Tyrone Sage on 06/06/11.  Post op with urinary retention and difficult catheterization requiring foley placement.  patient has had issues with confusion and agitation since surgery.  Post op fluid overload treated with diuresis.  Acute on chronic renal failure improving.  Mobility limited due to poor attention, disorientation and tachycardia.   Pt re-evaluation done yesterday.  MD recommending CIR.  Review of Systems  Constitutional: Positive for weight loss (100 lbs in past 2 years).  Respiratory: Negative for cough and shortness of breath.   Cardiovascular: Negative for chest pain.  Psychiatric/Behavioral:       Issues with confusion prior to admission   Past Medical History  Diagnosis Date  . Hypertension 12/2010    was initially orthostatic, but now was back to being hypotensive  . Atrial fibrillation     with a stable rate, on no medications  . Syncope     Near-syncope, etiology is deemed secondary to the excessive heat  . Benign prostatic hypertrophy     but stable  . Chronic anticoagulation   . Coronary artery disease   . Mitral regurgitation   . Shortness of breath   . Chronic kidney disease 12/2010    with a creatinine on discharge of 1.84, was  2.1 on admission  . Calculus of kidney   . H/O hiatal hernia    Past Surgical History  Procedure Date  . Circumcision 08/12/2002    because of phimosis  . Coronary artery bypass graft 06/06/2011    Procedure: CORONARY ARTERY BYPASS GRAFTING (CABG);  Surgeon: [name], MD;  Location: [facility] OR;  Service: Open Heart  Surgery;  Laterality: N/A;  grafts times three using left internal mammary artery and right leg greater saphenous vein harvested endoscopically  . Maze 06/06/2011    Procedure: MAZE;  Surgeon: [name], MD;  Location: [facility] OR;  Service: Open Heart Surgery;  Laterality: N/A;  . Mitral valve repair 06/06/2011    Procedure: MITRAL VALVE REPAIR (MVR);  Surgeon: [name], MD;  Location: [facility] OR;  Service: Open Heart Surgery;  Laterality: N/A;   Family History  Problem Relation Age of Onset  . Cancer Father   . Lung cancer Father   . Diabetes Father   . Hypertension Mother   . Hypertension Brother    Social History: Married and independent PTA.  Retired from [employer].  He reports that he has quit smoking-smoked about 6 years as a teenager.Marland Kitchen He does not have any smokeless tobacco history on file. He reports that he does not drink alcohol or use illicit drugs.   No Known Allergies  Prior to Admission medications   Medication Sig Start Date End Date Taking? Authorizing Provider  dutasteride (AVODART) 0.5 MG capsule Take 0.5 mg by mouth daily.    Yes Historical Provider, MD  warfarin (COUMADIN) 2 MG tablet Take 4-6 mg by mouth every evening. Take 4mg  daily, then on day 5 take 6mg .   Yes Historical Provider, MD  aspirin EC 81 MG EC tablet Take 1 tablet (81 mg total) by mouth daily. 06/13/11 06/12/12  Rowe Clack, PA  carvedilol (COREG) 6.25 MG tablet Take 1 tablet (6.25 mg total) by mouth 2 (two) times daily with a meal. 06/13/11 06/12/12  Rowe Clack, PA  furosemide (LASIX) 40 MG tablet Take 1 tablet (40 mg total) by mouth every morning. 06/14/11   Rowe Clack, PA  guaiFENesin (MUCINEX) 600 MG 12 hr tablet Take 1 tablet (600 mg total) by mouth every 12 (twelve) hours as needed. 06/13/11 06/12/12  Rowe Clack, PA  lisinopril (PRINIVIL,ZESTRIL) 20 MG tablet Take 0.5 tablets (10 mg total) by mouth every morning. 06/13/11   Rowe Clack, PA  potassium chloride SA (K-DUR,KLOR-CON) 20 MEQ tablet Take  1 tablet (20 mEq total) by mouth daily. 06/14/11   Rowe Clack, PA  simvastatin (ZOCOR) 20 MG tablet Take 1 tablet (20 mg total) by mouth daily at 6 PM. 06/13/11 06/12/12  Rowe Clack, PA  traMADol (ULTRAM) 50 MG tablet Take 1 tablet (50 mg total) by mouth every 6 (six) hours. 06/13/11   Rowe Clack, PA   Scheduled Medications:    . aspirin EC  81 mg Oral Daily  . carvedilol  6.25 mg Oral BID WC  . docusate sodium  200 mg Oral Daily  . dutasteride  0.5 mg Oral Daily  . furosemide  40 mg Oral Daily  . insulin aspart  0-24 Units Subcutaneous TID AC & HS  . pantoprazole  40 mg Oral QAC breakfast  . potassium chloride  20 mEq Oral Daily  . povidone-iodine  1 application Topical BID  . simvastatin  20 mg Oral q1800  . sodium chloride  3 mL Intravenous Q12H  . warfarin  2.5 mg Oral q1800   PRN MED's: sodium chloride, acetaminophen, bisacodyl, bisacodyl, guaiFENesin, ondansetron (ZOFRAN) IV, ondansetron, sodium chloride Home: Home Living Lives With: Spouse Receives Help From: Family Type of Home: House Home Layout: One level Home Access: Stairs to enter Entrance Stairs-Rails: None Secretary/administrator of Steps: 1 Bathroom Shower/Tub: Health visitor: Standard Home Adaptive Equipment: Built-in shower seat;Bedside commode/3-in-1;Walker - rolling;Straight cane  Functional History: Prior Function Level of Independence: Independent with basic ADLs;Independent with homemaking with ambulation;Independent with gait Driving: Yes Vocation: Retired Comments: pt states wife available 24 hrs a day unsure of how much assist she can provide Functional Status:  Mobility: Bed Mobility Bed Mobility: Yes Rolling Right: 4: Min assist Rolling Right Details (indicate cue type and reason): cueing for sequence and precautions Rolling Left: 3: Mod assist Rolling Left Details (indicate cue type and reason): Pt. instructed to hug pillow.  Pt. assisted with movement.   Right Sidelying to  Sit: 4: Min assist;HOB flat Right Sidelying to Sit Details (indicate cue type and reason): cueing to sequence with precautions Left Sidelying to Sit: 1: +2 Total assist;Patient percentage (comment);HOB elevated (comment degrees) (HOB 20 degrees, pt = 50%) Left Sidelying to Sit Details (indicate cue type and reason): Pt needed encouragement to hug pillow and not use arms.   Sitting - Scoot to Edge of Bed: 5: Supervision Sitting - Scoot to [place] of Bed Details (indicate cue type and reason): cues to reciprocally scoot rather than use bil UE Transfers Transfers: Yes Sit to Stand: 4: Min assist Sit to Stand Details (indicate cue type and reason): cueing for hands on thighs and sequence Stand to Sit: 5: Supervision Stand to Sit Details: cueing for sequence and safety Ambulation/Gait Ambulation/Gait: Yes Ambulation/Gait Assistance: 4: Min assist Ambulation/Gait Assistance Details (indicate cue type and reason):  minguard for safety and cues to step into RW and extend posture Ambulation Distance (Feet): 30 Feet Assistive device: Rolling walker Gait Pattern: Trunk flexed;Decreased stride length Gait velocity: Slow cadence Stairs: No Wheelchair Mobility Wheelchair Mobility: No  ADL:    Cognition: Cognition Arousal/Alertness: Awake/alert Orientation Level: Oriented to person;Disoriented to situation;Oriented to time;Oriented to place Cognition Arousal/Alertness: Awake/alert Overall Cognitive Status: Impaired Attention: Impaired Current Attention Level: Sustained Attention - Other Comments: secondds only Memory: Appears impaired Memory Deficits: Unable to recall situation, instructions, or tasks discussed Orientation Level: Oriented to person;Disoriented to situation;Oriented to time;Oriented to place Following Commands: Follows one step commands consistently Safety/Judgement: Decreased awareness of safety precautions Decreased Safety/Judgement: Decreased awareness of need for  assistance Awareness of Deficits: Decreased awareness of deficits  Blood pressure 133/90, pulse 81, temperature 97.7 F (36.5 C), temperature source Oral, resp. rate 18, height 6' (1.829 m), weight 78.3 kg (172 lb 9.9 oz), SpO2 99.00%. Physical Exam  Constitutional: He appears well-developed and well-nourished.  HENT:  Head: Normocephalic and atraumatic.  Neck: Normal range of motion. Neck supple.  Cardiovascular: Normal rate.   Occasional extrasystoles are present.  Pulmonary/Chest: Effort normal and breath sounds normal.  Abdominal: Soft. Bowel sounds are normal.  Musculoskeletal:       1+ pitting edema BLE  Neurological: He is alert.       Oriented to self and place only although he forgot which hospital he's in.  Follows basic commands without difficulty.  Pleasant and appropriate. Moves all extremities equally with approximately 4/5 strength.  No gross sensory findings.  CN exam is wnl. DTR's are 1+  Skin:       Chest incision CDI,  Legs are clean with abrasions, incisions all healing nicely.    Results for orders placed during the hospital encounter of 06/06/11 (from the past 24 hour(s))  GLUCOSE, CAPILLARY     Status: Abnormal   Collection Time   06/14/11 11:29 AM      Component Value Range   Glucose-Capillary 118 (*) 70 - 99 (mg/dL)   Comment 1 Documented in Chart     Comment 2 Notify RN    GLUCOSE, CAPILLARY     Status: Abnormal   Collection Time   06/14/11  4:33 PM      Component Value Range   Glucose-Capillary 115 (*) 70 - 99 (mg/dL)   Comment 1 Documented in Chart     Comment 2 Notify RN    GLUCOSE, CAPILLARY     Status: Abnormal   Collection Time   06/14/11  8:55 PM      Component Value Range   Glucose-Capillary 150 (*) 70 - 99 (mg/dL)  PROTIME-INR     Status: Abnormal   Collection Time   06/15/11  6:10 AM      Component Value Range   Prothrombin Time 20.5 (*) 11.6 - 15.2 (seconds)   INR 1.72 (*) 0.00 - 1.49   CBC     Status: Abnormal   Collection Time    06/15/11  6:10 AM      Component Value Range   WBC 8.0  4.0 - 10.5 (K/uL)   RBC 3.53 (*) 4.22 - 5.81 (MIL/uL)   Hemoglobin 10.5 (*) 13.0 - 17.0 (g/dL)   HCT 16.1 (*) 09.6 - 52.0 (%)   MCV 90.9  78.0 - 100.0 (fL)   MCH 29.7  26.0 - 34.0 (pg)   MCHC 32.7  30.0 - 36.0 (g/dL)   RDW 04.5  40.9 - 81.1 (%)  Platelets 246  150 - 400 (K/uL)  GLUCOSE, CAPILLARY     Status: Abnormal   Collection Time   06/15/11  6:26 AM      Component Value Range   Glucose-Capillary 106 (*) 70 - 99 (mg/dL)   No results found.  Assessment/Plan: Diagnosis: deconditioning/ encephalopathy s/p CABG and MVR 1. Does the need for close, 24 hr/day medical supervision in concert with the patient's rehab needs make it unreasonable for this patient to be served in a less intensive setting? Yes and Potentially 2. Co-Morbidities requiring supervision/potential complications: afib, htn, cm, ?baseline dementia 3. Due to bladder management, bowel management, safety, pain management and patient education, does the patient require 24 hr/day rehab nursing? Yes and Potentially 4. Does the patient require coordinated care of a physician, rehab nurse, PT (1-2 hrs/day, 5 days/week), OT (1-2 hrs/day, 5 days/week) and SLP (1-2 hrs/day, 5 days/week) to address physical and functional deficits in the context of the above medical diagnosis(es)? Yes and Potentially Addressing deficits in the following areas: balance, bathing, bowel/bladder control, cognition, dressing, endurance, feeding, grooming, locomotion, psychosocial adjustment, strength, swallowing, toileting and transferring 5. Can the patient actively participate in an intensive therapy program of at least 3 hrs of therapy per day at least 5 days per week? Yes 6. The potential for patient to make measurable gains while on inpatient rehab is good 7. Anticipated functional outcomes upon discharge from inpatients are modified independent to supervision PT, modified independent to  supervision OT, modified independent to supervision SLP 8. Estimated rehab length of stay to reach the above functional goals is: ? 1week 9. Does the patient have adequate social supports to accommodate these discharge functional goals? Yes and Potentially 10. Anticipated D/C setting: Home 11. Anticipated post D/C treatments: HH therapy 12. Overall Rehab/Functional Prognosis: good  RECOMMENDATIONS: This patient's condition is appropriate for continued rehabilitative care in the following setting: CIR vs HH Patient has agreed to participate in recommended program. Potentially Note that insurance prior authorization may be required for reimbursement for recommended care.  Comment: I'm unclear as to his baseline cognitive function.  There seems to be some question of dementia premorbidly. If he will require supervision at home regardless of a rehab stay or not, and he's at a minimal assistance level already, it may be difficult to justify an inpatient rehab admission. Rehab RN to follow up.     Ivory Broad, MD 06/15/2011

## 2011-06-15 NOTE — Progress Notes (Signed)
I just spoke with Dr. Retta Diones from [facility]. He instructed me to have the patient be discharged with the foley. He is already on Avodart, but I will also start Flomax 0.4 mg po in the evening. If he is discharged this weekend, he will be instructed to contact the urology office Monday to arrange a follow up appointment.

## 2011-06-15 NOTE — Progress Notes (Signed)
Clinical Social Worker completed the psychosocial assessment which can be found in the shadow chart.  CSW has faxed pt out to [facility] facilities and will continue to follow to facilitate transfer to SNF when pt is medically ready. MD, please sign FL2 in shadow chart.  Baxter Flattery, MSW [phone]

## 2011-06-15 NOTE — Progress Notes (Signed)
                    [address]            [place] [address]          [phone]     9 Days Post-Op Procedure(s) (LRB): CORONARY ARTERY BYPASS GRAFTING (CABG) (N/A) MAZE (N/A) MITRAL VALVE REPAIR (MVR) (N/A)  Subjective: Foley placed yesterday and UOP has been excellent.  Pt denies any complaints this am. Alert and oriented at present.  Objective: Vital signs in last 24 hours: Patient Vitals for the past 24 hrs:  BP Temp Temp src Pulse Resp SpO2 Weight  06/15/11 0452 133/90 mmHg 97.7 F (36.5 C) Oral 81  18  99 % 78.3 kg (172 lb 9.9 oz)  06/14/11 2057 130/89 mmHg 97.4 F (36.3 C) Oral 75  18  100 % -  06/14/11 1357 142/98 mmHg 98.5 F (36.9 C) Oral 120  17  97 % -  06/14/11 0834 - - - 124  - - -   Current Weight  06/15/11 78.3 kg (172 lb 9.9 oz)   Pre-op wt= 78.5 kg  Intake/Output from previous day: 01/10 0701 - 01/11 0700 In: 1580 [P.O.:480] Out: 3375 [Urine:3375]  CBGs 115-150-106  PHYSICAL EXAM:  Heart: Irr irr, rates 80s Lungs: clear Wound: clean and dry Extremities: mild LE edema  Lab Results: CBC: Basename 06/15/11 0610 06/13/11 0620  WBC 8.0 11.0*  HGB 10.5* 10.7*  HCT 32.1* 32.8*  PLT 246 171   BMET:  Basename 06/15/11 0610 06/13/11 0620  NA 140 139  K 3.2* 3.4*  CL 104 103  CO2 26 24  GLUCOSE 101* 105*  BUN 28* 31*  CREATININE 1.57* 1.35  CALCIUM 8.8 8.8    PT/INR:  Basename 06/15/11 0610  LABPROT 20.5*  INR 1.72*     Assessment/Plan: S/P Procedure(s) (LRB): CORONARY ARTERY BYPASS GRAFTING (CABG) (N/A) MAZE (N/A) MITRAL VALVE REPAIR (MVR) (N/A)  CV- rate controlled AF.  Continue Coreg, Coumadin.  BPs stable.  ACE-I on hold secondary to increased Cr. Urinary retention- Foley in place.  Urology to see today for further recommendations. ARI- Cr up further today (1.35-1.57).  May need to hold Lasix today and monitor.  UOP has been excellent.  Follow labs in am. Confusion- currently stable MS.  Continue to  monitor. Deconditioning- PT, CRPI    LOS: 9 days    Browning Southwood H 06/15/2011

## 2011-06-16 LAB — BASIC METABOLIC PANEL
CO2: 25 mEq/L (ref 19–32)
Calcium: 8.5 mg/dL (ref 8.4–10.5)
Creatinine, Ser: 1.31 mg/dL (ref 0.50–1.35)
GFR calc Af Amer: 61 mL/min — ABNORMAL LOW (ref >90)

## 2011-06-16 LAB — GLUCOSE, CAPILLARY
Glucose-Capillary: 108 mg/dL — ABNORMAL HIGH (ref 70–99)
Glucose-Capillary: 133 mg/dL — ABNORMAL HIGH (ref 70–99)
Glucose-Capillary: 108 mg/dL — ABNORMAL HIGH (ref 70–99)
Glucose-Capillary: 122 mg/dL — ABNORMAL HIGH (ref 70–99)

## 2011-06-16 LAB — PROTIME-INR
INR: 1.82 — ABNORMAL HIGH (ref 0.00–1.49)
Prothrombin Time: 21.4 seconds — ABNORMAL HIGH (ref 11.6–15.2)

## 2011-06-16 NOTE — Progress Notes (Addendum)
10 Days Post-Op Procedure(s) (LRB): CORONARY ARTERY BYPASS GRAFTING (CABG) (N/A) MAZE (N/A) MITRAL VALVE REPAIR (MVR) (N/A)  Subjective: Patient much more alert today.  Objective: Vital signs in last 24 hours: Patient Vitals for the past 24 hrs:  BP Temp Temp src Pulse Resp SpO2 Weight  06/16/11 0444 142/103 mmHg 97.2 F (36.2 C) Oral 112  18  96 % 176 lb 9.6 oz (80.105 kg)  06/15/11 2138 137/92 mmHg 98 F (36.7 C) Oral 89  16  97 % -  06/15/11 1500 120/70 mmHg 96.7 F (35.9 C) Oral 83  16  98 % -   Pre op weight 78.5  kg Current Weight  06/16/11 176 lb 9.6 oz (80.105 kg)      Intake/Output from previous day: 01/11 0701 - 01/12 0700 In: 600 [P.O.:600] Out: 750 [Urine:750]   Physical Exam:  Cardiovascular: IRRR, IRRR;no murmurs, gallops, or rubs. Pulmonary: Clear to auscultation bilaterally; no rales, wheezes, or rhonchi. Abdomen: Soft, non tender, bowel sounds present. Extremities: Mild bilateral lower extremity edema. Wounds: Clean and dry.  No erythema or signs of infection.  Lab Results: CBC: Basename 06/15/11 0610  WBC 8.0  HGB 10.5*  HCT 32.1*  PLT 246   BMET:  Basename 06/16/11 0515 06/15/11 0610  NA 137 140  K 3.7 3.2*  CL 103 104  CO2 25 26  GLUCOSE 105* 101*  BUN 22 28*  CREATININE 1.31 1.57*  CALCIUM 8.5 8.8    PT/INR:  Basename 06/16/11 0515  LABPROT 21.4*  INR 1.82*   ABG:  INR: Will add last result for INR, ABG once components are confirmed Will add last 4 CBG results once components are confirmed  Assessment/Plan:  1. CV - PAFib with CVR. Continue Coreg 6.25 bid and Coumadin. Would consider low dose ACE as had low EF pre op. 2.  Pulmonary - Encourage incentive spirometer. 3. Volume Overload - Continue with diuresis. 4.  Acute blood loss anemia - H/H stable at 10.5/32.1. 5.Supplement Potassium. 6.Creatnine improved to 1.3. 7.Urinary retention-patient to be discharged with foley and follow up with Urologist. 8.Much less  confused. 9.To SNF soon.   ZIMMERMAN,[name] 06/16/2011   patient examined and medical record reviewed,agree with above note.Start lisinopril at home VAN TRIGT III,PETER 06/16/2011

## 2011-06-16 NOTE — Progress Notes (Signed)
CARDIAC REHAB PHASE I   PRE:  Rate/Rhythm: 77SR  BP:  Supine:   Sitting: 120/80  Standing:    SaO2: 98%RA  MODE:  Ambulation: 400 ft   POST:  Rate/Rhythem: 104  BP:  Supine:   Sitting: 110/80  Standing:    SaO2: 96%RA 1324-1350 Pt walked 400 ft on RA with rolling walker and asst x 1. Had to stop many times due to legs hurting.  Sore around knee areas. To recliner with call bell and familly in room. Encouraged walks with staff later.  Duanne Limerick

## 2011-06-16 NOTE — Progress Notes (Signed)
   Subjective:  Less confused today.  Oriented to place.  Denies chest pain or dyspnea. Telemetry shows atrial fib with controlled VR.  INR 1.82  Objective:  Vital Signs in the last 24 hours: Temp:  [96.7 F (35.9 C)-98 F (36.7 C)] 97.2 F (36.2 C) (01/12 0444) Pulse Rate:  [83-112] 112  (01/12 0444) Resp:  [16-18] 18  (01/12 0444) BP: (120-142)/(70-103) 142/103 mmHg (01/12 0444) SpO2:  [96 %-98 %] 96 % (01/12 0444) Weight:  [176 lb 9.6 oz (80.105 kg)] 176 lb 9.6 oz (80.105 kg) (01/12 0444)  Intake/Output from previous day: 01/11 0701 - 01/12 0700 In: 600 [P.O.:600] Out: 750 [Urine:750] Intake/Output from this shift:       . aspirin EC  81 mg Oral Daily  . carvedilol  6.25 mg Oral BID WC  . docusate sodium  200 mg Oral Daily  . dutasteride  0.5 mg Oral Daily  . insulin aspart  0-24 Units Subcutaneous TID AC & HS  . pantoprazole  40 mg Oral QAC breakfast  . potassium chloride  40 mEq Oral BID  . povidone-iodine  1 application Topical BID  . simvastatin  20 mg Oral q1800  . sodium chloride  3 mL Intravenous Q12H  . warfarin  2.5 mg Oral q1800      Physical Exam: The patient appears to be in no distress.  Head and neck exam reveals that the pupils are equal and reactive.  The extraocular movements are full.  There is no scleral icterus.  Mouth and pharynx are benign.  No lymphadenopathy.  No carotid bruits.  The jugular venous pressure is normal.  Thyroid is not enlarged or tender.  Chest is clear to percussion and auscultation.  No rales or rhonchi.  Expansion of the chest is symmetrical.  Heart reveals no abnormal lift or heave.  First and second heart sounds are normal.  There is no murmur gallop rub or click.  The abdomen is soft and nontender.  Bowel sounds are normoactive.  There is no hepatosplenomegaly or mass.  There are no abdominal bruits.  Foley catheter in place.  Extremities reveal no phlebitis or edema.  Pedal pulses are good.  There is no cyanosis or  clubbing.  Neurologic exam is normal strength and no lateralizing weakness.  No sensory deficits.  Integument reveals no rash  Lab Results:  Basename 06/15/11 0610  WBC 8.0  HGB 10.5*  PLT 246    Basename 06/16/11 0515 06/15/11 0610  NA 137 140  K 3.7 3.2*  CL 103 104  CO2 25 26  GLUCOSE 105* 101*  BUN 22 28*  CREATININE 1.31 1.57*   No results found for this basename: TROPONINI:2,CK,MB:2 in the last 72 hours Hepatic Function Panel No results found for this basename: PROT,ALBUMIN,AST,ALT,ALKPHOS,BILITOT,BILIDIR,IBILI in the last 72 hours No results found for this basename: CHOL in the last 72 hours No results found for this basename: PROTIME in the last 72 hours  Imaging: Imaging results have been reviewed  Cardiac Studies:  Assessment/Plan:  Patient Active Hospital Problem List: S/P CABG and mitral valve repair. Chronic atrial fib on coumadin.    Plan: continue present cardiac Rx    LOS: 10 days    Cassell Clement 06/16/2011, 9:03 AM

## 2011-06-16 NOTE — Progress Notes (Signed)
EPW discontinued per protocol. Tips intact. Patient tolerated well. Last INR INR/Prothrombin Time on   .  Patient advised Bedrest X 1 hour. Kule Gascoigne Aba RN   

## 2011-06-17 DIAGNOSIS — I4891 Unspecified atrial fibrillation: Secondary | ICD-10-CM

## 2011-06-17 DIAGNOSIS — I2510 Atherosclerotic heart disease of native coronary artery without angina pectoris: Principal | ICD-10-CM

## 2011-06-17 LAB — GLUCOSE, CAPILLARY
Glucose-Capillary: 111 mg/dL — ABNORMAL HIGH (ref 70–99)
Glucose-Capillary: 156 mg/dL — ABNORMAL HIGH (ref 70–99)
Glucose-Capillary: 105 mg/dL — ABNORMAL HIGH (ref 70–99)
Glucose-Capillary: 116 mg/dL — ABNORMAL HIGH (ref 70–99)

## 2011-06-17 LAB — PROTIME-INR
INR: 1.64 — ABNORMAL HIGH (ref 0.00–1.49)
Prothrombin Time: 19.7 seconds — ABNORMAL HIGH (ref 11.6–15.2)

## 2011-06-17 MED ORDER — LISINOPRIL 5 MG PO TABS
5.0000 mg | ORAL_TABLET | Freq: Every day | ORAL | Status: DC
  Administered 2011-06-17 – 2011-06-19 (×3): 5 mg via ORAL
  Filled 2011-06-17 (×4): qty 1

## 2011-06-17 MED ORDER — LISINOPRIL 5 MG PO TABS: 5.0000 mg | ORAL_TABLET | ORAL | Status: DC

## 2011-06-17 MED ORDER — TAMSULOSIN HCL 0.4 MG PO CAPS: 0.4000 mg | ORAL_CAPSULE | Freq: Every day | ORAL | Status: DC

## 2011-06-17 MED ORDER — DIPHENHYDRAMINE HCL 25 MG PO CAPS
25.0000 mg | ORAL_CAPSULE | Freq: Every evening | ORAL | Status: DC | PRN
  Administered 2011-06-17: 25 mg via ORAL
  Filled 2011-06-17: qty 1

## 2011-06-17 MED ORDER — ZINC OXIDE 20 % EX OINT
TOPICAL_OINTMENT | Freq: Every day | CUTANEOUS | Status: DC
  Administered 2011-06-17 – 2011-06-19 (×4): via TOPICAL
  Filled 2011-06-17 (×8): qty 28

## 2011-06-17 MED ORDER — LISINOPRIL 5 MG PO TABS: 5.0000 mg | ORAL_TABLET | Freq: Every day | ORAL | Status: DC

## 2011-06-17 MED ORDER — CARVEDILOL 6.25 MG PO TABS: 6.2500 mg | ORAL_TABLET | Freq: Two times a day (BID) | ORAL | Status: DC

## 2011-06-17 MED ORDER — GUAIFENESIN ER 600 MG PO TB12: 600.0000 mg | ORAL_TABLET | Freq: Two times a day (BID) | ORAL | Status: DC | PRN

## 2011-06-17 MED ORDER — WARFARIN SODIUM 5 MG PO TABS
5.0000 mg | ORAL_TABLET | Freq: Every day | ORAL | Status: DC
  Administered 2011-06-17 – 2011-06-18 (×2): 5 mg via ORAL
  Filled 2011-06-17 (×3): qty 1

## 2011-06-17 MED ORDER — POTASSIUM CHLORIDE CRYS ER 20 MEQ PO TBCR: 20.0000 meq | EXTENDED_RELEASE_TABLET | Freq: Every day | ORAL | Status: DC

## 2011-06-17 MED ORDER — FUROSEMIDE 40 MG PO TABS: 40.0000 mg | ORAL_TABLET | ORAL | Status: DC

## 2011-06-17 MED ORDER — TAMSULOSIN HCL 0.4 MG PO CAPS
0.4000 mg | ORAL_CAPSULE | Freq: Every day | ORAL | Status: DC
  Administered 2011-06-17 – 2011-06-18 (×2): 0.4 mg via ORAL
  Filled 2011-06-17 (×3): qty 1

## 2011-06-17 NOTE — Progress Notes (Addendum)
11 Days Post-Op Procedure(s) (LRB): CORONARY ARTERY BYPASS GRAFTING (CABG) (N/A) MAZE (N/A) MITRAL VALVE REPAIR (MVR) (N/A)  Subjective: Patient alerted to place, but not day or year. States he slept better, but nurse reports otherwise. Objective: Vital signs in last 24 hours: Patient Vitals for the past 24 hrs:  BP Temp Temp src Pulse Resp SpO2 Weight  06/17/11 0500 - - - - - - 175 lb 14.8 oz (79.8 kg)  06/17/11 0428 138/95 mmHg 97.4 F (36.3 C) Oral 107  17  97 % -  06/16/11 2037 112/79 mmHg 97.5 F (36.4 C) Oral 77  17  98 % -  06/16/11 1858 122/89 mmHg - - 82  16  100 % -   Pre op weight 78.5  kg Current Weight  06/17/11 175 lb 14.8 oz (79.8 kg)      Intake/Output from previous day: 01/12 0701 - 01/13 0700 In: 240 [P.O.:240] Out: 601 [Urine:600; Stool:1]   Physical Exam:  Cardiovascular: IRRR, IRRR;no murmurs, gallops, or rubs. Pulmonary: Clear to auscultation bilaterally; no rales, wheezes, or rhonchi. Abdomen: Soft, non tender, bowel sounds present. Extremities: Mild bilateral lower extremity edema. Wounds: Clean and dry.  No erythema or signs of infection.  Lab Results: CBC:  Basename 06/15/11 0610  WBC 8.0  HGB 10.5*  HCT 32.1*  PLT 246   BMET:   Basename 06/16/11 0515 06/15/11 0610  NA 137 140  K 3.7 3.2*  CL 103 104  CO2 25 26  GLUCOSE 105* 101*  BUN 22 28*  CREATININE 1.31 1.57*  CALCIUM 8.5 8.8    PT/INR:   Basename 06/17/11 0600  LABPROT 19.7*  INR 1.64*   ABG:  INR: Will add last result for INR, ABG once components are confirmed Will add last 4 CBG results once components are confirmed  Assessment/Plan:  1. CV - PAFib with CVR. Continue Coreg 6.25 bid , Lisinopril 5 daily, and will increase Coumadin to 5 as INR continues to decrease. 2.  Pulmonary - Encourage incentive spirometer. 3. Volume Overload - Continue with diuresis. 4.  Acute blood loss anemia - H/H stable at 10.5/32.1. 5.Creatnine improved to 1.3. 6.Urinary  retention-patient to be discharged with foley and follow up with Urologist.Continue with Flomax and Avodart. 7.Benadryl for sleep. 8.To SNF soon.   ZIMMERMAN,[name] 06/17/2011   patient examined and medical record reviewed,agree with above note.Start lisinopril at home Doree Fudge M 06/17/2011   patient examined and medical record reviewed,agree with above note.Plan SNF w/ indwelling Foley VAN TRIGT III,PETER 06/17/2011

## 2011-06-17 NOTE — Progress Notes (Signed)
   Subjective:  Less confused today.  Oriented to place but not to day or year. Denies chest pain or dyspnea. Telemetry shows atrial fib with controlled VR.  INR subtherapeutic.1.64  Objective:  Vital Signs in the last 24 hours: Temp:  [97.4 F (36.3 C)-97.5 F (36.4 C)] 97.4 F (36.3 C) (01/13 0428) Pulse Rate:  [77-112] 107  (01/13 0428) Resp:  [16-20] 17  (01/13 0428) BP: (112-139)/(79-98) 138/95 mmHg (01/13 0428) SpO2:  [97 %-100 %] 97 % (01/13 0428) Weight:  [175 lb 14.8 oz (79.8 kg)] 175 lb 14.8 oz (79.8 kg) (01/13 0500)  Intake/Output from previous day: 01/12 0701 - 01/13 0700 In: 240 [P.O.:240] Out: 601 [Urine:600; Stool:1] Intake/Output from this shift:       . aspirin EC  81 mg Oral Daily  . carvedilol  6.25 mg Oral BID WC  . docusate sodium  200 mg Oral Daily  . dutasteride  0.5 mg Oral Daily  . lisinopril  5 mg Oral Daily  . pantoprazole  40 mg Oral QAC breakfast  . potassium chloride  40 mEq Oral BID  . povidone-iodine  1 application Topical BID  . simvastatin  20 mg Oral q1800  . sodium chloride  3 mL Intravenous Q12H  . warfarin  2.5 mg Oral q1800  . DISCONTD: insulin aspart  0-24 Units Subcutaneous TID AC & HS      Physical Exam: The patient appears to be in no distress.  Head and neck exam reveals that the pupils are equal and reactive.  The extraocular movements are full.  There is no scleral icterus.  Mouth and pharynx are benign.  No lymphadenopathy.  No carotid bruits.  The jugular venous pressure is normal.  Thyroid is not enlarged or tender.  Chest is clear to percussion and auscultation.  No rales or rhonchi.  Expansion of the chest is symmetrical.  Heart reveals no abnormal lift or heave.  First and second heart sounds are normal.  There is no murmur gallop rub or click. Rhythm irregular.  The abdomen is soft and nontender.  Bowel sounds are normoactive.  There is no hepatosplenomegaly or mass.  There are no abdominal bruits.  Foley catheter  in place.  Extremities reveal no phlebitis or edema.  Pedal pulses are good.  There is no cyanosis or clubbing.  Neurologic exam is normal strength and no lateralizing weakness.  No sensory deficits.  Integument reveals no rash  Lab Results:  Basename 06/15/11 0610  WBC 8.0  HGB 10.5*  PLT 246    Basename 06/16/11 0515 06/15/11 0610  NA 137 140  K 3.7 3.2*  CL 103 104  CO2 25 26  GLUCOSE 105* 101*  BUN 22 28*  CREATININE 1.31 1.57*   No results found for this basename: TROPONINI:2,CK,MB:2 in the last 72 hours Hepatic Function Panel No results found for this basename: PROT,ALBUMIN,AST,ALT,ALKPHOS,BILITOT,BILIDIR,IBILI in the last 72 hours No results found for this basename: CHOL in the last 72 hours No results found for this basename: PROTIME in the last 72 hours  Imaging: Imaging results have been reviewed  Cardiac Studies:  Assessment/Plan:  Patient Active Hospital Problem List: S/P CABG and mitral valve repair. Chronic atrial fib on coumadin.    Plan: continue present cardiac Rx. Add lisinopril for LV dysfunction. Check BMET in am     LOS: 11 days    Cassell Clement 06/17/2011, 8:39 AM

## 2011-06-17 NOTE — Discharge Summary (Addendum)
Addendum Note to D/C Troy Gregory is a 76 y.o. male who is S/P Procedure(s): CORONARY ARTERY BYPASS GRAFTING (CABG) MAZE MITRAL VALVE REPAIR (MVR).  Patient remained afebrile and hemodynamically stable. He did have intermittent confusion. He is not on narcotics. After speaking with his wife, he may have some underlying dementia. Unfortunately during one of these episodes of confusion, the patient removed his Foley. A condom cath was placed and there was urine in the bag; however, the patient was bladder scanned and was found to have 300-400 cc of residual urine. I  spoke with Dr. Isabel Caprice from [facility] and he recommended that the nurse attempt to put an 56 French Foley catheter with a 30 cc balloon and then tape it to his leg. He states that there should not be any problem inserting the foley, provided this size catheter is used. IThe nurse was able to place the Foley without difficulty. I then spoke with Dr. Retta Diones from [facility] he recommended starting the patient on Flomax 0.4 mg at bedtime. He also advised that the patient be discharged with his Foley and the patient is to contact his office for a followup within that week. On telemetry, he continued to have paroxysmal atrial fibrillation with a controlled ventricular rate. His Coumadin was adjusted daily based on his PT and INR results. Dr. Patty Sermons saw the patient today and because of his low EF (and his creatinine has now normalized), he placed him on lisinopril 5 mg by mouth daily. In addition, he was placed on Digoxin 0.125 mg po daily.  DISCHARGE MEDICATIONS: aspirin 81MG  EC tablet   Take 1 tablet (81 mg total) by mouth daily.       Digoxin 0.125 MG tablet Take 1 tablet(0.125 mg)  by mouth daily.    carvedilol 12.5 MG tablet   Commonly known as: COREG   Take 1 tablet (12.5 mg total) by mouth 2 (two) times daily with a meal.           guaiFENesin 600 MG 12 hr tablet   Commonly known as: MUCINEX   Take 1 tablet (600  mg total) by mouth every 12 (twelve) hours as needed.           potassium chloride SA 20 MEQ tablet   Commonly known as: K-DUR,KLOR-CON   Take 1 tablet (20 mEq total) by mouth daily.        simvastatin 20 MG tablet   Commonly known as: ZOCOR   Take 1 tablet (20 mg total) by mouth daily at 6 PM.           Tamulosin HCl 0.4 mg caps   Commonly known as: FLOMAX   Take 1 capsule (0.4 mg total) by mouth at bedtime.           Tramadol  50 mg tablet   Commonly known as: ULTRAM   Take 1 tablet (50 mg total) by mouth every 6 (six) hours.           furosemide 40 MG tablet   Commonly known as: LASIX   Take 1 tablet (40 mg total) by mouth every morning.   What changed: medication strength           lisinopril 5 MG tablet   Commonly known as: PRINIVIL,ZESTRIL   Take 1 tablet (5 mg total) by mouth every morning.   What changed: - medication strength - dose           dutasteride 0.5 MG capsule  Commonly known as: AVODART   Take 0.5 mg by mouth daily.       warfarin 5 MG tablet   Commonly known as: COUMADIN   Take 0.5 tablet (2.5 total) by mouth every evening or as directed by Dr. Elvis Coil office.            Ardelle Balls PA-C 06/17/2011 12:19 PM

## 2011-06-18 LAB — PROTIME-INR
INR: 1.71 — ABNORMAL HIGH (ref 0.00–1.49)
Prothrombin Time: 20.4 seconds — ABNORMAL HIGH (ref 11.6–15.2)

## 2011-06-18 LAB — GLUCOSE, CAPILLARY
Glucose-Capillary: 99 mg/dL (ref 70–99)
Glucose-Capillary: 133 mg/dL — ABNORMAL HIGH (ref 70–99)
Glucose-Capillary: 90 mg/dL (ref 70–99)
Glucose-Capillary: 107 mg/dL — ABNORMAL HIGH (ref 70–99)

## 2011-06-18 LAB — BASIC METABOLIC PANEL
BUN: 18 mg/dL (ref 6–23)
Chloride: 104 mEq/L (ref 96–112)
GFR calc Af Amer: 67 mL/min — ABNORMAL LOW (ref >90)
GFR calc non Af Amer: 58 mL/min — ABNORMAL LOW (ref >90)
Potassium: 4.8 mEq/L (ref 3.5–5.1)
Sodium: 136 mEq/L (ref 135–145)

## 2011-06-18 MED ORDER — DIGOXIN 250 MCG PO TABS
0.2500 mg | ORAL_TABLET | Freq: Three times a day (TID) | ORAL | Status: AC
  Administered 2011-06-18 (×3): 0.25 mg via ORAL
  Filled 2011-06-18 (×3): qty 1

## 2011-06-18 MED ORDER — WARFARIN SODIUM 5 MG PO TABS
5.0000 mg | ORAL_TABLET | Freq: Once | ORAL | Status: DC
  Filled 2011-06-18: qty 1

## 2011-06-18 MED ORDER — CARVEDILOL 12.5 MG PO TABS
12.5000 mg | ORAL_TABLET | Freq: Two times a day (BID) | ORAL | Status: DC
  Administered 2011-06-18 – 2011-06-19 (×3): 12.5 mg via ORAL
  Filled 2011-06-18 (×5): qty 1

## 2011-06-18 MED ORDER — DIGOXIN 125 MCG PO TABS
0.1250 mg | ORAL_TABLET | Freq: Every day | ORAL | Status: DC
  Administered 2011-06-19: 0.125 mg via ORAL
  Filled 2011-06-18: qty 1

## 2011-06-18 NOTE — Progress Notes (Signed)
Patient ID: Troy Gregory, male   DOB: [birth year], 76 y.o.   MRN: [id number]   I was called by the PA for CT surgery on Troy Gregory because his foley wasn't draining.   He has pulled his foley out a couple of time and currently has an 66fr catheter.  The catheter stopped draining and he apparently went with reduced urine output and increased pain for a few hours.   After I was called, I recommended the foley be irrigated.  That was done with return of a small clot followed by several hundred cc of concentrated but otherwise clear urine.  I have reviewed the patients records.  ROS: his pain has improved.  Imp: Clot retention.  Rec: Irrigate with NS prn.   He should f/u with Dr. Isabel Caprice in the office.

## 2011-06-18 NOTE — Progress Notes (Signed)
UR Completed.  Darshan Solanki Jane [phone] 06/18/2011  

## 2011-06-18 NOTE — Progress Notes (Signed)
Patient's foley catheter noted to be not draining. Bladder scan revealed 541 cc of urine in bladder.  Gershon Crane, [place] and Dr. Annabell Howells aware.  Received order from Dr. Annabell Howells to irrigate foley.  Foley irrigated by [place] and myself with about 30 cc's of normal saline.  Return of small blood clots and free flowing urine obtained.  Initial UOP of 250 cc's, then foley reconnected.  Two hours later 650 cc's of tea coloered UOP  Obtained.  Patient tolerating foley well. Baird Lyons 6:59 PM

## 2011-06-18 NOTE — Progress Notes (Signed)
Pt has bed available at [facility]. CSW will follow to facilitate d/c to SNF 1/15 if pt medically ready.  Baxter Flattery, MSW [phone]

## 2011-06-18 NOTE — Progress Notes (Signed)
Patient ID: Troy Gregory, male   DOB: January 03, 1936, 76 y.o.   MRN: [id number]                   [address]            [place] [address]          [phone]     12 Days Post-Op Procedure(s) (LRB): CORONARY ARTERY BYPASS GRAFTING (CABG) (N/A) MAZE (N/A) MITRAL VALVE REPAIR (MVR) (N/A)  LOS: 12 days   Subjective: Confusion improving, knows person and place, and that working on NHP  Objective: Vital signs in last 24 hours: Patient Vitals for the past 24 hrs:  BP Temp Temp src Pulse Resp SpO2 Weight  06/18/11 0442 136/97 mmHg 97.7 F (36.5 C) Oral 108  19  96 % 178 lb 5.6 oz (80.9 kg)  06/17/11 2055 140/84 mmHg 98 F (36.7 C) Oral 107  18  98 % -  06/17/11 1451 126/90 mmHg 97.5 F (36.4 C) Oral 97  18  97 % -   Wt Readings from Last 3 Encounters:  06/18/11 178 lb 5.6 oz (80.9 kg)  06/18/11 178 lb 5.6 oz (80.9 kg)  06/04/11 173 lb 1 oz (78.5 kg)        Intake/Output from previous day: 01/13 0701 - 01/14 0700 In: 480 [P.O.:480] Out: -      Scheduled Meds:   . aspirin EC  81 mg Oral Daily  . carvedilol  12.5 mg Oral BID WC  . digoxin  0.125 mg Oral Daily  . digoxin  0.25 mg Oral TID  . docusate sodium  200 mg Oral Daily  . dutasteride  0.5 mg Oral Daily  . Najat Olazabal's butt cream   Topical Daily  . lisinopril  5 mg Oral Daily  . pantoprazole  40 mg Oral QAC breakfast  . potassium chloride  40 mEq Oral BID  . povidone-iodine  1 application Topical BID  . simvastatin  20 mg Oral q1800  . sodium chloride  3 mL Intravenous Q12H  . Tamsulosin HCl  0.4 mg Oral QHS  . warfarin  5 mg Oral q1800  . warfarin  5 mg Oral ONCE-1800  . DISCONTD: carvedilol  6.25 mg Oral BID WC  . DISCONTD: warfarin  2.5 mg Oral q1800   Continuous Infusions:  PRN Meds:.sodium chloride, acetaminophen, bisacodyl, bisacodyl, diphenhydrAMINE, guaiFENesin, ondansetron (ZOFRAN) IV, ondansetron, sodium chloride  General appearance: alert, cooperative, no distress and slowed  mentation Neurologic: intact Heart: irregularly irregular rhythm Lungs: clear to auscultation bilaterally Abdomen: soft, non-tender; bowel sounds normal; no masses,  no organomegaly Extremities: extremities normal, atraumatic, no cyanosis or edema Wound: sternum stable  Lab Results: CBC:No results found for this basename: WBC:2,HGB:2,HCT:2,PLT:2 in the last 72 hours BMET:  [facility] 06/18/11 0455 06/16/11 0515  NA 136 137  K 4.8 3.7  CL 104 103  CO2 23 25  GLUCOSE 113* 105*  BUN 18 22  CREATININE 1.21 1.31  CALCIUM 9.1 8.5    PT/INR:  Basename 06/18/11 0455  LABPROT 20.4*  INR 1.71*     Radiology No results found.   Assessment/Plan: S/P Procedure(s) (LRB): CORONARY ARTERY BYPASS GRAFTING (CABG) (N/A) MAZE (N/A) MITRAL VALVE REPAIR (MVR) (N/A) Discussed with Dr Estill Cotta, added dig for rate control today and increased Coreg dose Continue coumadin To rehab in am if bed ready and rate controlled   [name] MD 06/18/2011 7:52 AM

## 2011-06-18 NOTE — Progress Notes (Signed)
CARDIAC REHAB PHASE I   PRE:  Rate/Rhythm: 83AFIB  BP:  Supine:   Sitting: 110/80  Standing:    SaO2: 98%RA  MODE:  Ambulation: 300 ft   POST:  Rate/Rhythem: 98  BP:  Supine: 118/80  Sitting:   Standing:    SaO2: 100%RA 1315-1342 Pt confused and did not want to walk. Needed much encouragement to walk. Stopped frequently and c/o leg pain. Said he could not go any farther. To bed after walk. RN put bed alarm on. Used rolling walker and asst x 1. Encouraged pt to stay close to walker and keep hands on it.  Duanne Limerick

## 2011-06-18 NOTE — Progress Notes (Signed)
Subjective:  Less confused today.  Oriented to place but not to day or year. Denies chest pain or dyspnea. Telemetry shows atrial fib with RVR.  INR subtherapeutic.1.71  Objective:  Vital Signs in the last 24 hours: Temp:  [97.5 F (36.4 C)-98 F (36.7 C)] 97.7 F (36.5 C) (01/14 0442) Pulse Rate:  [97-108] 108  (01/14 0442) Resp:  [18-19] 19  (01/14 0442) BP: (126-140)/(84-97) 136/97 mmHg (01/14 0442) SpO2:  [96 %-98 %] 96 % (01/14 0442) Weight:  [80.9 kg (178 lb 5.6 oz)] 80.9 kg (178 lb 5.6 oz) (01/14 0442)  Intake/Output from previous day: 01/13 0701 - 01/14 0700 In: 480 [P.O.:480] Out: -  Intake/Output from this shift:       . aspirin EC  81 mg Oral Daily  . carvedilol  12.5 mg Oral BID WC  . digoxin  0.125 mg Oral Daily  . digoxin  0.25 mg Oral TID  . docusate sodium  200 mg Oral Daily  . dutasteride  0.5 mg Oral Daily  . Gerhardt's butt cream   Topical Daily  . lisinopril  5 mg Oral Daily  . pantoprazole  40 mg Oral QAC breakfast  . potassium chloride  40 mEq Oral BID  . povidone-iodine  1 application Topical BID  . simvastatin  20 mg Oral q1800  . sodium chloride  3 mL Intravenous Q12H  . Tamsulosin HCl  0.4 mg Oral QHS  . warfarin  5 mg Oral q1800  . DISCONTD: carvedilol  6.25 mg Oral BID WC  . DISCONTD: warfarin  2.5 mg Oral q1800      Physical Exam: The patient appears to be in no distress.  Head and neck exam reveals that the pupils are equal and reactive.  The extraocular movements are full.  There is no scleral icterus.  Mouth and pharynx are benign.  No lymphadenopathy.  No carotid bruits.  The jugular venous pressure is normal.  Thyroid is not enlarged or tender.  Chest is clear to percussion and auscultation.  No rales or rhonchi.  Expansion of the chest is symmetrical.  Heart reveals no abnormal lift or heave.  First and second heart sounds are normal.  There is no murmur gallop rub or click. Rhythm irregular.  The abdomen is soft and  nontender.  Bowel sounds are normoactive.  There is no hepatosplenomegaly or mass.  There are no abdominal bruits.  Foley catheter in place.  Extremities reveal no phlebitis or edema.  Pedal pulses are good.  There is no cyanosis or clubbing.  Neurologic exam is normal strength and no lateralizing weakness.  No sensory deficits.  Integument reveals no rash  Lab Results: No results found for this basename: WBC:2,HGB:2,PLT:2 in the last 72 hours  Basename 06/18/11 0455 06/16/11 0515  NA 136 137  K 4.8 3.7  CL 104 103  CO2 23 25  GLUCOSE 113* 105*  BUN 18 22  CREATININE 1.21 1.31   No results found for this basename: TROPONINI:2,CK,MB:2 in the last 72 hours Hepatic Function Panel No results found for this basename: PROT,ALBUMIN,AST,ALT,ALKPHOS,BILITOT,BILIDIR,IBILI in the last 72 hours No results found for this basename: CHOL in the last 72 hours No results found for this basename: PROTIME in the last 72 hours  Imaging: Imaging results have been reviewed  Cardiac Studies:  Assessment/Plan:  Patient Active Hospital Problem List: S/P CABG and mitral valve repair. Chronic atrial fib on coumadin.  RAte poorly controlled.  Plan: Will increase Coreg and digitalize for better  rate control. Continue other cardiac medications.    LOS: 12 days    Theron Arista [facility] 06/18/2011, 7:13 AM

## 2011-06-18 NOTE — Progress Notes (Signed)
                   [address]            [place] [address]          [phone]      Having urinary retention d/t nonfunctioning foley. Irrigated of old clot with normal saline using sterile technique. Have discussed with on call urologist Dr. Annabell Howells

## 2011-06-18 NOTE — Progress Notes (Addendum)
Physical Therapy Treatment Patient Details Name: Troy Gregory MRN: [id number] DOB: [birth year] Today's Date: 06/18/2011  PT Assessment/Plan  PT - Assessment/Plan Comments on Treatment Session: Pt s/p CABG& MVR with AMS. Pt remains confused and oriented only to safe and hospital. Pt progressing well with mobility but requires minguard and supervision for all mobility secondary to cognitive deficits. Spouse present and educated for transfers with sternal and stair with RW handout provided. PT Plan: Discharge plan remains appropriate;Frequency remains appropriate PT Goals  Acute Rehab PT Goals PT Goal: Supine/Side to Sit - Progress: Progressing toward goal Pt will Transfer Bed to Chair/Chair to Bed: with modified independence PT Transfer Goal: Bed to Chair/Chair to Bed - Progress: Revised (modified due to lack of progress/goal met) PT Goal: Ambulate - Progress: Progressing toward goal PT Goal: Up/Down Stairs - Progress: Met Additional Goals PT Goal: Additional Goal #1 - Progress: Progressing toward goal  PT Treatment Precautions/Restrictions  Precautions Precautions: Sternal;Fall Precaution Comments: Encouraged use of pillow.   Required Braces or Orthoses: No Restrictions Weight Bearing Restrictions: No Mobility (including Balance) Bed Mobility Rolling Left: 4: Min assist Rolling Left Details (indicate cue type and reason): cues to sequence and for safety Sitting - Scoot to Edge of Bed: 4: Min assist Sitting - Scoot to Edge of Bed Details (indicate cue type and reason): cues for sequence and safety, HOB flat Transfers Sit to Stand: 5: Supervision;From bed Sit to Stand Details (indicate cue type and reason): cues for hand placement Stand to Sit: 5: Supervision;To chair/3-in-1 Stand to Sit Details: cues for precautions Ambulation/Gait Ambulation/Gait Assistance: 4: Min assist Ambulation/Gait Assistance Details (indicate cue type and reason): Minguard for safety and constant cueing  to step into RW and extend trunk as well as to look up and be aware of environment Ambulation Distance (Feet): 400 Feet Assistive device: Rolling walker Gait Pattern: Trunk flexed;Decreased stride length Gait velocity: 30/25=1.19ft/sec gait speed  Stairs: Yes Stairs Assistance: 4: Min assist Stair Management Technique: Backwards;With walker Number of Stairs: 2  Height of Stairs: 8     Exercise  General Exercises - Lower Extremity Long Arc Quad: AROM;10 reps;Both;Seated Hip Flexion/Marching: AROM;Both;Other reps (comment);Seated (20reps) End of Session PT - End of Session Equipment Utilized During Treatment: Gait belt Activity Tolerance: Patient tolerated treatment well Patient left: in chair;with call bell in reach;with family/visitor present (seat alarm on) Nurse Communication: Mobility status for ambulation General Behavior During Session: [facility] for tasks performed Cognition: Impaired  Delorse Lek 06/18/2011, 8:58 AM Toney Sang, PT [phone]

## 2011-06-19 ENCOUNTER — Inpatient Hospital Stay (HOSPITAL_COMMUNITY): Payer: Medicare Other

## 2011-06-19 LAB — BASIC METABOLIC PANEL
BUN: 17 mg/dL (ref 6–23)
CO2: 26 mEq/L (ref 19–32)
Calcium: 8.9 mg/dL (ref 8.4–10.5)
Chloride: 105 mEq/L (ref 96–112)
Creatinine, Ser: 1.28 mg/dL (ref 0.50–1.35)
GFR calc Af Amer: 62 mL/min — ABNORMAL LOW (ref >90)
GFR calc non Af Amer: 54 mL/min — ABNORMAL LOW (ref >90)
Glucose, Bld: 82 mg/dL (ref 70–99)
Potassium: 4.9 mEq/L (ref 3.5–5.1)
Sodium: 139 mEq/L (ref 135–145)

## 2011-06-19 LAB — GLUCOSE, CAPILLARY: Glucose-Capillary: 124 mg/dL — ABNORMAL HIGH (ref 70–99)

## 2011-06-19 LAB — CBC
HCT: 31.3 % — ABNORMAL LOW (ref 39.0–52.0)
Hemoglobin: 10.1 g/dL — ABNORMAL LOW (ref 13.0–17.0)
MCH: 29.5 pg (ref 26.0–34.0)
MCHC: 32.3 g/dL (ref 30.0–36.0)
MCV: 91.5 fL (ref 78.0–100.0)
Platelets: 277 10*3/uL (ref 150–400)
RBC: 3.42 MIL/uL — ABNORMAL LOW (ref 4.22–5.81)
RDW: 14.4 % (ref 11.5–15.5)
WBC: 8.3 10*3/uL (ref 4.0–10.5)

## 2011-06-19 LAB — PROTIME-INR
INR: 2.16 — ABNORMAL HIGH (ref 0.00–1.49)
Prothrombin Time: 24.5 seconds — ABNORMAL HIGH (ref 11.6–15.2)

## 2011-06-19 MED ORDER — DIGOXIN 125 MCG PO TABS: 0.1250 mg | ORAL_TABLET | Freq: Every day | ORAL | Status: DC

## 2011-06-19 MED ORDER — CARVEDILOL 12.5 MG PO TABS: 12.5000 mg | ORAL_TABLET | Freq: Two times a day (BID) | ORAL | Status: DC

## 2011-06-19 MED ORDER — WARFARIN SODIUM 2.5 MG PO TABS
2.5000 mg | ORAL_TABLET | Freq: Once | ORAL | Status: DC
  Filled 2011-06-19: qty 1

## 2011-06-19 MED ORDER — WARFARIN SODIUM 5 MG PO TABS: ORAL_TABLET | ORAL | Status: DC

## 2011-06-19 NOTE — Progress Notes (Addendum)
13 Days Post-Op Procedure(s) (LRB): CORONARY ARTERY BYPASS GRAFTING (CABG) (N/A) MAZE (N/A) MITRAL VALVE REPAIR (MVR) (N/A)  Subjective: Patient oriented to place but not to day or year. Denies any complaints. Hopes to go to a facility today.  Objective: Vital signs in last 24 hours: Patient Vitals for the past 24 hrs:  BP Temp Temp src Pulse Resp SpO2 Weight  06/19/11 0533 130/95 mmHg 97.8 F (36.6 C) Oral 90  16  93 % 174 lb 13.2 oz (79.3 kg)  06/18/11 2136 144/97 mmHg 98.2 F (36.8 C) Oral 64  16  97 % -  06/18/11 1352 133/103 mmHg 97.4 F (36.3 C) Oral 100  20  98 % -  06/18/11 0847 - - - 94  - 93 % -  06/18/11 0831 - - - 71  - 91 % -   Pre op weight 78.5 kg Current Weight  06/19/11 174 lb 13.2 oz (79.3 kg)      Intake/Output from previous day: 01/14 0701 - 01/15 0700 In: -  Out: 2750 [Urine:2750]   Physical Exam:  Cardiovascular: IRRR, IRRR;no murmurs, gallops, or rubs. Pulmonary: Clear to auscultation bilaterally; no rales, wheezes, or rhonchi. Abdomen: Soft, non tender, bowel sounds present. Extremities: Mild bilateral lower extremity edema. Wounds: Clean and dry.  No erythema or signs of infection.  Lab Results: CBC: Basename 06/19/11 0530  WBC 8.3  HGB 10.1*  HCT 31.3*  PLT 277   BMET:  Basename 06/19/11 0530 06/18/11 0455  NA 139 136  K 4.9 4.8  CL 105 104  CO2 26 23  GLUCOSE 82 113*  BUN 17 18  CREATININE 1.28 1.21  CALCIUM 8.9 9.1    PT/INR:  Basename 06/19/11 0530  LABPROT 24.5*  INR 2.16*   ABG:  INR: Will add last result for INR, ABG once components are confirmed Will add last 4 CBG results once components are confirmed  Assessment/Plan:  1. CV - AFib with CVR. Continue current meds. 2.  Pulmonary - Stable. Encourage incentive spirometer. 3. Continue with diuresis as pre op EF 20-25% 4.  Acute blood loss anemia - H/H stable at 10.1/31.3 5.Surgically stable for discharge when bed available.   ZIMMERMAN,DONIELLE  MPA-C 06/19/2011   I have seen and examined [name] and agree with the above assessment  and plan.  [name] MD Beeper [phone] Office [phone] 06/19/2011 2:11 PM

## 2011-06-19 NOTE — Progress Notes (Signed)
Physical Therapy Treatment Patient Details Name: Troy Gregory MRN: [id number] DOB: December 20, 1935 Today's Date: 06/19/2011  PT Assessment/Plan  PT - Assessment/Plan Comments on Treatment Session: Pt s/p CABG, MVR, AMS. Pt continues to be limited by cognition and ability to recall and adhere to sternal precautions. Mobility and activity tolerance continue to improve.  PT Plan: Discharge plan remains appropriate PT Goals  Acute Rehab PT Goals Pt will go Supine/Side to Sit: with modified independence PT Goal: Supine/Side to Sit - Progress: Revised (modified due to lack of progress/goal met) Pt will go Sit to Stand: with modified independence PT Goal: Sit to Stand - Progress: Updated due to goal met PT Transfer Goal: Bed to Chair/Chair to Bed - Progress: Progressing toward goal Pt will Ambulate: >150 feet;with modified independence;with least restrictive assistive device PT Goal: Ambulate - Progress: Revised (modified due to lack of progress/goal met) Additional Goals PT Goal: Additional Goal #1 - Progress: Progressing toward goal  PT Treatment Precautions/Restrictions  Precautions Precautions: Sternal;Fall Precaution Comments: Encouraged use of pillow.   Required Braces or Orthoses: No Restrictions Weight Bearing Restrictions: No Mobility (including Balance) Bed Mobility Rolling Left: 5: Supervision Rolling Left Details (indicate cue type and reason): cues to sequence for precautions Left Sidelying to Sit: 5: Supervision;HOB flat Sitting - Scoot to Edge of Bed: 5: Supervision Transfers Sit to Stand: 5: Supervision;From bed;From chair/3-in-1 Sit to Stand Details (indicate cue type and reason): cues to scoot forward first, lean forward and for hand placement Stand to Sit: 5: Supervision;To chair/3-in-1;To toilet Ambulation/Gait Ambulation/Gait Assistance: 5: Supervision Ambulation/Gait Assistance Details (indicate cue type and reason): cueing throughout to step into RW, extend trunk  and assist to use directional signs to be able to return to room Ambulation Distance (Feet): 650 Feet Assistive device: Rolling walker Gait Pattern: Trunk flexed;Decreased stride length Gait velocity: 30/22=1.36 Stairs: No    Exercise  General Exercises - Lower Extremity Long Arc Quad: AROM;20 reps;Both;Seated Hip Flexion/Marching: AROM;Both;Other reps (comment);Seated (20reps) End of Session PT - End of Session Equipment Utilized During Treatment: Gait belt Activity Tolerance: Patient tolerated treatment well Patient left: with call bell in reach;with family/visitor present;Other (comment) (toilet with RN aware) Nurse Communication: Mobility status for transfers;Mobility status for ambulation General Behavior During Session: [facility] for tasks performed Cognition: Impaired  Delorse Lek 06/19/2011, 9:07 AM Toney Sang, PT [phone]

## 2011-06-19 NOTE — Progress Notes (Signed)
Subjective:  Less confused today.  Oriented to place but not to day or year. Denies chest pain or dyspnea. Telemetry shows atrial fib with controlled rate.  INR therapeutic 2.16  Objective:  Vital Signs in the last 24 hours: Temp:  [97.4 F (36.3 C)-98.2 F (36.8 C)] 97.8 F (36.6 C) (01/15 0533) Pulse Rate:  [64-100] 90  (01/15 0533) Resp:  [16-20] 16  (01/15 0533) BP: (130-144)/(95-103) 130/95 mmHg (01/15 0533) SpO2:  [91 %-98 %] 93 % (01/15 0533) Weight:  [79.3 kg (174 lb 13.2 oz)] 79.3 kg (174 lb 13.2 oz) (01/15 0533)  Intake/Output from previous day: 01/14 0701 - 01/15 0700 In: -  Out: 2750 [Urine:2750] Intake/Output from this shift: Total I/O In: -  Out: 1700 [Urine:1700]     . aspirin EC  81 mg Oral Daily  . carvedilol  12.5 mg Oral BID WC  . digoxin  0.125 mg Oral Daily  . digoxin  0.25 mg Oral TID  . docusate sodium  200 mg Oral Daily  . dutasteride  0.5 mg Oral Daily  . Gerhardt's butt cream   Topical Daily  . lisinopril  5 mg Oral Daily  . pantoprazole  40 mg Oral QAC breakfast  . potassium chloride  40 mEq Oral BID  . povidone-iodine  1 application Topical BID  . simvastatin  20 mg Oral q1800  . sodium chloride  3 mL Intravenous Q12H  . Tamsulosin HCl  0.4 mg Oral QHS  . warfarin  5 mg Oral q1800  . DISCONTD: carvedilol  6.25 mg Oral BID WC  . DISCONTD: warfarin  5 mg Oral ONCE-1800      Physical Exam: The patient appears to be in no distress.  Head and neck exam reveals that the pupils are equal and reactive.  The extraocular movements are full.  There is no scleral icterus.  Mouth and pharynx are benign.  No lymphadenopathy.  No carotid bruits.  The jugular venous pressure is normal.  Thyroid is not enlarged or tender.  Chest is clear to percussion and auscultation.  No rales or rhonchi.  Expansion of the chest is symmetrical.  Heart reveals no abnormal lift or heave.  First and second heart sounds are normal.  There is no murmur gallop rub or  click. Rhythm irregular.  The abdomen is soft and nontender.  Bowel sounds are normoactive.  There is no hepatosplenomegaly or mass.  There are no abdominal bruits.  Foley catheter in place.  Extremities reveal no phlebitis or edema.  Pedal pulses are good.  There is no cyanosis or clubbing.  Neurologic exam is normal strength and no lateralizing weakness.  No sensory deficits.  Integument reveals no rash  Lab Results:  Basename 06/19/11 0530  WBC 8.3  HGB 10.1*  PLT 277    Basename 06/18/11 0455  NA 136  K 4.8  CL 104  CO2 23  GLUCOSE 113*  BUN 18  CREATININE 1.21   No results found for this basename: TROPONINI:2,CK,MB:2 in the last 72 hours Hepatic Function Panel No results found for this basename: PROT,ALBUMIN,AST,ALT,ALKPHOS,BILITOT,BILIDIR,IBILI in the last 72 hours No results found for this basename: CHOL in the last 72 hours No results found for this basename: PROTIME in the last 72 hours  Imaging: Imaging results have been reviewed  Cardiac Studies:  Assessment/Plan:  Patient Active Hospital Problem List: S/P CABG and mitral valve repair. Chronic atrial fib on coumadin.  Rate control good on coreg and digoxin. No brady or  pauses.  Plan: Continue current medications. OK for discharge to nursing facility from cardiac standpoint.    LOS: 13 days    Troy Gregory 06/19/2011, 6:56 AM

## 2011-06-19 NOTE — Progress Notes (Signed)
[id number] Cardiac Rehab Completed discharge education with pt and wife. He agrees to [facility]. [facility] in [place], will send referral.

## 2011-06-19 NOTE — Progress Notes (Signed)
Pt to d/c to [facility] skilled facility today. CSW awaiting facility to advise pt room is ready and will follow to facilitate d/c ASAP, likely after lunch. RN advised. Baxter Flattery, MSW [phone]

## 2011-06-19 NOTE — Progress Notes (Signed)
Patient ID: Troy Gregory, male   DOB: 08-03-1935, 76 y.o.   MRN: [id number]  [name] is doing well this morning.  His catheter has drained well all night and has required no irrigation.  He denies abdominal pain.  The foley is draining clear urine with excellent output.  Assess:  History of foley trauma with hematuria and clot retention no resolved.  Rec:  Will need f/u with Dr. Isabel Caprice as an outpatient for further evaluation and treatment of his voiding difficulty.

## 2011-06-29 ENCOUNTER — Encounter (HOSPITAL_COMMUNITY): Payer: Self-pay | Admitting: Adult Health

## 2011-06-29 ENCOUNTER — Other Ambulatory Visit: Payer: Self-pay

## 2011-06-29 ENCOUNTER — Inpatient Hospital Stay (HOSPITAL_COMMUNITY)
Admission: EM | Admit: 2011-06-29 | Discharge: 2011-07-04 | DRG: 690 | Disposition: A | Discharge status: Home / Self Care | Payer: Medicare Other | Attending: Internal Medicine | Admitting: Internal Medicine

## 2011-06-29 ENCOUNTER — Emergency Department (HOSPITAL_COMMUNITY): Payer: Medicare Other

## 2011-06-29 DIAGNOSIS — IMO0002 Reserved for concepts with insufficient information to code with codable children: Secondary | ICD-10-CM | POA: Insufficient documentation

## 2011-06-29 DIAGNOSIS — Z9889 Other specified postprocedural states: Secondary | ICD-10-CM | POA: Insufficient documentation

## 2011-06-29 DIAGNOSIS — D696 Thrombocytopenia, unspecified: Secondary | ICD-10-CM | POA: Diagnosis present

## 2011-06-29 DIAGNOSIS — R319 Hematuria, unspecified: Secondary | ICD-10-CM | POA: Diagnosis present

## 2011-06-29 DIAGNOSIS — I2589 Other forms of chronic ischemic heart disease: Secondary | ICD-10-CM | POA: Diagnosis present

## 2011-06-29 DIAGNOSIS — B961 Klebsiella pneumoniae [K. pneumoniae] as the cause of diseases classified elsewhere: Secondary | ICD-10-CM | POA: Diagnosis present

## 2011-06-29 DIAGNOSIS — I959 Hypotension, unspecified: Secondary | ICD-10-CM | POA: Diagnosis present

## 2011-06-29 DIAGNOSIS — Z87442 Personal history of urinary calculi: Secondary | ICD-10-CM

## 2011-06-29 DIAGNOSIS — R7881 Bacteremia: Secondary | ICD-10-CM | POA: Diagnosis present

## 2011-06-29 DIAGNOSIS — R5381 Other malaise: Secondary | ICD-10-CM | POA: Diagnosis present

## 2011-06-29 DIAGNOSIS — F29 Unspecified psychosis not due to a substance or known physiological condition: Secondary | ICD-10-CM | POA: Diagnosis present

## 2011-06-29 DIAGNOSIS — Z7901 Long term (current) use of anticoagulants: Secondary | ICD-10-CM

## 2011-06-29 DIAGNOSIS — R509 Fever, unspecified: Secondary | ICD-10-CM

## 2011-06-29 DIAGNOSIS — N179 Acute kidney failure, unspecified: Secondary | ICD-10-CM | POA: Diagnosis present

## 2011-06-29 DIAGNOSIS — I2510 Atherosclerotic heart disease of native coronary artery without angina pectoris: Secondary | ICD-10-CM | POA: Diagnosis present

## 2011-06-29 DIAGNOSIS — Z951 Presence of aortocoronary bypass graft: Secondary | ICD-10-CM

## 2011-06-29 DIAGNOSIS — Z954 Presence of other heart-valve replacement: Secondary | ICD-10-CM

## 2011-06-29 DIAGNOSIS — I4891 Unspecified atrial fibrillation: Secondary | ICD-10-CM | POA: Diagnosis present

## 2011-06-29 DIAGNOSIS — I480 Paroxysmal atrial fibrillation: Secondary | ICD-10-CM | POA: Insufficient documentation

## 2011-06-29 DIAGNOSIS — N189 Chronic kidney disease, unspecified: Secondary | ICD-10-CM | POA: Diagnosis present

## 2011-06-29 DIAGNOSIS — R9430 Abnormal result of cardiovascular function study, unspecified: Secondary | ICD-10-CM | POA: Insufficient documentation

## 2011-06-29 DIAGNOSIS — I129 Hypertensive chronic kidney disease with stage 1 through stage 4 chronic kidney disease, or unspecified chronic kidney disease: Secondary | ICD-10-CM | POA: Diagnosis present

## 2011-06-29 DIAGNOSIS — I255 Ischemic cardiomyopathy: Secondary | ICD-10-CM

## 2011-06-29 DIAGNOSIS — R6510 Systemic inflammatory response syndrome (SIRS) of non-infectious origin without acute organ dysfunction: Secondary | ICD-10-CM | POA: Diagnosis present

## 2011-06-29 DIAGNOSIS — N400 Enlarged prostate without lower urinary tract symptoms: Secondary | ICD-10-CM | POA: Diagnosis present

## 2011-06-29 DIAGNOSIS — N390 Urinary tract infection, site not specified: Principal | ICD-10-CM | POA: Diagnosis present

## 2011-06-29 DIAGNOSIS — N289 Disorder of kidney and ureter, unspecified: Secondary | ICD-10-CM

## 2011-06-29 HISTORY — DX: Reserved for concepts with insufficient information to code with codable children: IMO0002

## 2011-06-29 HISTORY — DX: Abnormal result of cardiovascular function study, unspecified: R94.30

## 2011-06-29 LAB — DIFFERENTIAL
Basophils Absolute: 0 10*3/uL (ref 0.0–0.1)
Lymphocytes Relative: 1 % — ABNORMAL LOW (ref 12–46)
Neutro Abs: 4.9 10*3/uL (ref 1.7–7.7)
Neutrophils Relative %: 98 % — ABNORMAL HIGH (ref 43–77)

## 2011-06-29 LAB — URINALYSIS, ROUTINE W REFLEX MICROSCOPIC
Bilirubin Urine: NEGATIVE
Ketones, ur: NEGATIVE mg/dL
Nitrite: NEGATIVE
Specific Gravity, Urine: 1.011 (ref 1.005–1.030)
pH: 8 (ref 5.0–8.0)

## 2011-06-29 LAB — POCT I-STAT, CHEM 8
Chloride: 97 mEq/L (ref 96–112)
Creatinine, Ser: 1.7 mg/dL — ABNORMAL HIGH (ref 0.50–1.35)
Glucose, Bld: 103 mg/dL — ABNORMAL HIGH (ref 70–99)
HCT: 33 % — ABNORMAL LOW (ref 39.0–52.0)
Potassium: 4.5 mEq/L (ref 3.5–5.1)

## 2011-06-29 LAB — CBC
Hemoglobin: 11.2 g/dL — ABNORMAL LOW (ref 13.0–17.0)
MCH: 29.6 pg (ref 26.0–34.0)
MCHC: 32.5 g/dL (ref 30.0–36.0)
MCV: 91 fL (ref 78.0–100.0)
RBC: 3.79 MIL/uL — ABNORMAL LOW (ref 4.22–5.81)

## 2011-06-29 LAB — PROTIME-INR: Prothrombin Time: 18.1 seconds — ABNORMAL HIGH (ref 11.6–15.2)

## 2011-06-29 LAB — LACTIC ACID, PLASMA: Lactic Acid, Venous: 2.7 mmol/L — ABNORMAL HIGH (ref 0.5–2.2)

## 2011-06-29 LAB — URINE MICROSCOPIC-ADD ON

## 2011-06-29 MED ORDER — SODIUM CHLORIDE 0.9 % IV SOLN
Freq: Once | INTRAVENOUS | Status: AC
  Administered 2011-06-29: 20 mL/h via INTRAVENOUS

## 2011-06-29 MED ORDER — ACETAMINOPHEN 650 MG RE SUPP
650.0000 mg | Freq: Once | RECTAL | Status: AC
  Administered 2011-06-29: 650 mg via RECTAL
  Filled 2011-06-29: qty 1

## 2011-06-29 NOTE — ED Notes (Signed)
Pt from nursing facility and recent hx of CABG on 06/06/11 - pt has been reported to have increased confusion abnormal for pt and dark red urine. Pt w/ foley cath in place and family reports this has been in place since 06/06/11. Pt denies any pain at present. Breath sounds clear and equal bilaterally. Pt resting comfortably on bed. Family x2 at bedside.

## 2011-06-29 NOTE — ED Notes (Addendum)
Sent over from [facility] with blood in catheter and confusion. Pt recently had quadruple bypass and mitral valve repair 3 weeks ago. . Foley with hematuria, pt unable to answer questions appropriately. Normally has intermittent confusion since surgery.

## 2011-06-29 NOTE — ED Notes (Signed)
BJY:[id number]<FA> Expected date:06/29/11<BR> Expected time: 7:10 PM<BR> Means of arrival:Ambulance<BR> Comments:<BR> EMS 27 PTAR, 75 yom hematuria, hypotensive

## 2011-06-29 NOTE — ED Provider Notes (Signed)
Pt with fever and hematuria,  pe  Lungs clear abd neg.  Medical screening examination/treatment/procedure(s) were conducted as a shared visit with non-physician practitioner(s) and myself.  I personally evaluated the patient during the encounter   Benny Lennert, MD 06/29/11 2049

## 2011-06-29 NOTE — ED Provider Notes (Addendum)
History     CSN: [id number]  Arrival date & time 06/29/11  1945   First MD Initiated Contact with Patient 06/29/11 2025      Chief Complaint  Patient presents with  . Urinary Tract Infection    (Consider location/radiation/quality/duration/timing/severity/associated sxs/prior treatment) The history is provided by the spouse.    Past Medical History  Diagnosis Date  . Hypertension 12/2010    was initially orthostatic, but now was back to being hypotensive  . Atrial fibrillation     with a stable rate, on no medications  . Syncope     Near-syncope, etiology is deemed secondary to the excessive heat  . Benign prostatic hypertrophy     but stable  . Chronic anticoagulation   . Coronary artery disease   . Mitral regurgitation   . Shortness of breath   . Chronic kidney disease 12/2010    with a creatinine on discharge of 1.84, was  2.1 on admission  . Calculus of kidney   . H/O hiatal hernia     Past Surgical History  Procedure Date  . Circumcision 08/12/2002    because of phimosis  . Coronary artery bypass graft 06/06/2011    Procedure: CORONARY ARTERY BYPASS GRAFTING (CABG);  Surgeon: [name], MD;  Location: [facility] OR;  Service: Open Heart Surgery;  Laterality: N/A;  grafts times three using left internal mammary artery and right leg greater saphenous vein harvested endoscopically  . Maze 06/06/2011    Procedure: MAZE;  Surgeon: [name], MD;  Location: [facility] OR;  Service: Open Heart Surgery;  Laterality: N/A;  . Mitral valve repair 06/06/2011    Procedure: MITRAL VALVE REPAIR (MVR);  Surgeon: [name], MD;  Location: [facility] OR;  Service: Open Heart Surgery;  Laterality: N/A;    Family History  Problem Relation Age of Onset  . Cancer Father   . Lung cancer Father   . Diabetes Father   . Hypertension Mother   . Hypertension Brother     History  Substance Use Topics  . Smoking status: Former Smoker -- 45 years  . Smokeless tobacco: Not on file  .  Alcohol Use: No      Review of Systems  Constitutional: Negative for fever.  Psychiatric/Behavioral: Positive for confusion.    Allergies  Review of patient's allergies indicates no known allergies.  Home Medications   Current Outpatient Rx  Name Route Sig Dispense Refill  . ASPIRIN 81 MG PO TBEC Oral Take 1 tablet (81 mg total) by mouth daily.    Marland Kitchen CARVEDILOL 12.5 MG PO TABS Oral Take 1 tablet (12.5 mg total) by mouth 2 (two) times daily with a meal. 60 tablet 1  . DIGOXIN 0.125 MG PO TABS Oral Take 1 tablet (0.125 mg total) by mouth daily.    . DUTASTERIDE 0.5 MG PO CAPS Oral Take 0.5 mg by mouth daily.     . FUROSEMIDE 40 MG PO TABS Oral Take 1 tablet (40 mg total) by mouth every morning. 5 tablet 0  . GUAIFENESIN ER 600 MG PO TB12 Oral Take 600 mg by mouth every 12 (twelve) hours as needed. congestion    . LISINOPRIL 5 MG PO TABS Oral Take 1 tablet (5 mg total) by mouth every morning. 30 tablet 1  . POTASSIUM CHLORIDE CRYS ER 20 MEQ PO TBCR Oral Take 1 tablet (20 mEq total) by mouth daily. 5 tablet 0  . SIMVASTATIN 20 MG PO TABS Oral Take 1 tablet (20  mg total) by mouth daily at 6 PM. 30 tablet 1  . TAMSULOSIN HCL 0.4 MG PO CAPS Oral Take 1 capsule (0.4 mg total) by mouth at bedtime. 30 capsule   . TRAMADOL HCL 50 MG PO TABS Oral Take 50 mg by mouth every 6 (six) hours as needed. pain    . WARFARIN SODIUM 3 MG PO TABS Oral Take 3 mg by mouth daily.      BP 126/69  Pulse 86  Temp(Src) 103.3 F (39.6 C) (Rectal)  Resp 18  SpO2 100%  Physical Exam  Constitutional: He is oriented to person, place, and time. He appears well-developed and well-nourished.  HENT:  Head: Normocephalic.  Eyes: Pupils are equal, round, and reactive to light.  Cardiovascular: Normal rate.   Pulmonary/Chest: Effort normal.  Abdominal: Soft. He exhibits no distension. There is no tenderness.  Neurological: He is alert and oriented to person, place, and time.  Skin: Skin is warm. No rash noted.  There is pallor.    ED Course  Procedures (including critical care time)  Labs Reviewed  URINALYSIS, ROUTINE W REFLEX MICROSCOPIC - Abnormal; Notable for the following:    APPearance CLOUDY (*)    Hgb urine dipstick LARGE (*)    Protein, ur >300 (*)    Leukocytes, UA LARGE (*)    All other components within normal limits  CBC - Abnormal; Notable for the following:    RBC 3.79 (*)    Hemoglobin 11.2 (*)    HCT 34.5 (*)    Platelets 137 (*)    All other components within normal limits  DIFFERENTIAL - Abnormal; Notable for the following:    Neutrophils Relative 98 (*)    Lymphocytes Relative 1 (*)    Lymphs Abs 0.1 (*)    Monocytes Relative 0 (*)    Monocytes Absolute 0.0 (*)    All other components within normal limits  LACTIC ACID, PLASMA - Abnormal; Notable for the following:    Lactic Acid, Venous 2.7 (*)    All other components within normal limits  POCT I-STAT, CHEM 8 - Abnormal; Notable for the following:    Creatinine, Ser 1.70 (*)    Glucose, Bld 103 (*)    Hemoglobin 11.2 (*)    HCT 33.0 (*)    All other components within normal limits  PROTIME-INR - Abnormal; Notable for the following:    Prothrombin Time 18.1 (*)    All other components within normal limits  PROCALCITONIN  DIGOXIN LEVEL  URINE MICROSCOPIC-ADD ON  I-STAT, CHEM 8  CULTURE, BLOOD (ROUTINE X 2)  CULTURE, BLOOD (ROUTINE X 2)  URINE CULTURE   Dg Chest Port 1 View  06/29/2011  *RADIOLOGY REPORT*  Clinical Data: Altered mental status.  Weakness.  Atrial fibrillation.  Coronary artery disease peri  PORTABLE CHEST - 1 VIEW  Comparison: 06/19/2011  Findings: Low lung volumes are seen.  Patient is partially rotated. Cardiomegaly stable.  Both lungs are clear.  The patient has undergone previous CABG and mitral valve replacement.  IMPRESSION: Stable cardiomegaly and low lung volumes.  No acute findings.  Original Report Authenticated By: Danae Orleans, M.D.     1. Fever   2. Hypotension   3. Hematuria    4. Renal insufficiency       MDM  Patient has been hydrated.  Vital signs have stabilized.  She needed monitoring Blood pressure has come up patient remains confused, but this is stable confusion for this patient I spoke with  Dr. Isabel Caprice, urology who will round on the patient tomorrow in the hospital,  he does not feel at this time that the Foley catheter needs to be changed        Arman Filter, NP 06/29/11 2333  Arman Filter, NP 07/26/11 2236

## 2011-06-30 ENCOUNTER — Encounter (HOSPITAL_COMMUNITY): Payer: Self-pay | Admitting: Internal Medicine

## 2011-06-30 DIAGNOSIS — I2510 Atherosclerotic heart disease of native coronary artery without angina pectoris: Secondary | ICD-10-CM

## 2011-06-30 DIAGNOSIS — I4891 Unspecified atrial fibrillation: Secondary | ICD-10-CM

## 2011-06-30 DIAGNOSIS — R9430 Abnormal result of cardiovascular function study, unspecified: Secondary | ICD-10-CM | POA: Insufficient documentation

## 2011-06-30 DIAGNOSIS — I359 Nonrheumatic aortic valve disorder, unspecified: Secondary | ICD-10-CM

## 2011-06-30 DIAGNOSIS — R6510 Systemic inflammatory response syndrome (SIRS) of non-infectious origin without acute organ dysfunction: Secondary | ICD-10-CM | POA: Diagnosis present

## 2011-06-30 DIAGNOSIS — IMO0002 Reserved for concepts with insufficient information to code with codable children: Secondary | ICD-10-CM | POA: Insufficient documentation

## 2011-06-30 DIAGNOSIS — R319 Hematuria, unspecified: Secondary | ICD-10-CM | POA: Diagnosis present

## 2011-06-30 DIAGNOSIS — D696 Thrombocytopenia, unspecified: Secondary | ICD-10-CM | POA: Diagnosis present

## 2011-06-30 LAB — COMPREHENSIVE METABOLIC PANEL
ALT: 42 U/L (ref 0–53)
Alkaline Phosphatase: 95 U/L (ref 39–117)
BUN: 23 mg/dL (ref 6–23)
CO2: 25 mEq/L (ref 19–32)
GFR calc Af Amer: 40 mL/min — ABNORMAL LOW (ref >90)
GFR calc non Af Amer: 34 mL/min — ABNORMAL LOW (ref >90)
Glucose, Bld: 139 mg/dL — ABNORMAL HIGH (ref 70–99)
Potassium: 4 mEq/L (ref 3.5–5.1)
Sodium: 132 mEq/L — ABNORMAL LOW (ref 135–145)
Total Bilirubin: 0.6 mg/dL (ref 0.3–1.2)
Total Protein: 5.8 g/dL — ABNORMAL LOW (ref 6.0–8.3)

## 2011-06-30 LAB — CBC
HCT: 29.2 % — ABNORMAL LOW (ref 39.0–52.0)
MCHC: 32.2 g/dL (ref 30.0–36.0)
Platelets: 123 10*3/uL — ABNORMAL LOW (ref 150–400)
RDW: 13.8 % (ref 11.5–15.5)
WBC: 15.9 10*3/uL — ABNORMAL HIGH (ref 4.0–10.5)

## 2011-06-30 LAB — PROTIME-INR
INR: 1.47 (ref 0.00–1.49)
Prothrombin Time: 18.1 seconds — ABNORMAL HIGH (ref 11.6–15.2)

## 2011-06-30 LAB — INFLUENZA PANEL BY PCR (TYPE A & B): Influenza A By PCR: NEGATIVE

## 2011-06-30 LAB — TROPONIN I: Troponin I: <0.3 ng/mL (ref <0.30)

## 2011-06-30 MED ORDER — ONDANSETRON HCL 4 MG/2ML IJ SOLN: 4.0000 mg | Freq: Four times a day (QID) | INTRAMUSCULAR | Status: DC | PRN

## 2011-06-30 MED ORDER — SODIUM CHLORIDE 0.9 % IV SOLN
Freq: Once | INTRAVENOUS | Status: AC
  Administered 2011-06-30: 500 mL/h via INTRAVENOUS

## 2011-06-30 MED ORDER — ASPIRIN EC 81 MG PO TBEC
81.0000 mg | DELAYED_RELEASE_TABLET | Freq: Every day | ORAL | Status: DC
  Administered 2011-06-30 – 2011-07-04 (×5): 81 mg via ORAL
  Filled 2011-06-30 (×5): qty 1

## 2011-06-30 MED ORDER — PIPERACILLIN-TAZOBACTAM 3.375 G IVPB
3.3750 g | Freq: Three times a day (TID) | INTRAVENOUS | Status: DC
  Administered 2011-06-30 – 2011-07-02 (×9): 3.375 g via INTRAVENOUS
  Filled 2011-06-30 (×11): qty 50

## 2011-06-30 MED ORDER — ACETAMINOPHEN 325 MG PO TABS: 650.0000 mg | ORAL_TABLET | Freq: Four times a day (QID) | ORAL | Status: DC | PRN

## 2011-06-30 MED ORDER — VANCOMYCIN HCL IN DEXTROSE 1-5 GM/200ML-% IV SOLN
1000.0000 mg | Freq: Every day | INTRAVENOUS | Status: DC
  Administered 2011-06-30: 1000 mg via INTRAVENOUS
  Filled 2011-06-30: qty 200

## 2011-06-30 MED ORDER — SODIUM CHLORIDE 0.9 % IV BOLUS (SEPSIS)
500.0000 mL | Freq: Once | INTRAVENOUS | Status: AC
  Administered 2011-06-30: 500 mL via INTRAVENOUS

## 2011-06-30 MED ORDER — SODIUM CHLORIDE 0.9 % IV SOLN
INTRAVENOUS | Status: DC
  Administered 2011-06-30: 02:00:00 via INTRAVENOUS
  Administered 2011-06-30 – 2011-07-01 (×3): 125 mL/h via INTRAVENOUS

## 2011-06-30 MED ORDER — WARFARIN SODIUM 5 MG PO TABS
5.0000 mg | ORAL_TABLET | Freq: Once | ORAL | Status: AC
  Administered 2011-06-30: 5 mg via ORAL
  Filled 2011-06-30: qty 1

## 2011-06-30 MED ORDER — PIPERACILLIN-TAZOBACTAM 3.375 G IVPB 30 MIN
3.3750 g | Freq: Once | INTRAVENOUS | Status: AC
  Administered 2011-06-30: 3.375 g via INTRAVENOUS
  Filled 2011-06-30: qty 50

## 2011-06-30 MED ORDER — SODIUM CHLORIDE 0.9 % IV BOLUS (SEPSIS)
1000.0000 mL | Freq: Once | INTRAVENOUS | Status: AC
  Administered 2011-06-30: 1000 mL via INTRAVENOUS

## 2011-06-30 MED ORDER — DIGOXIN 125 MCG PO TABS
0.1250 mg | ORAL_TABLET | Freq: Every day | ORAL | Status: DC
  Administered 2011-06-30 – 2011-07-03 (×4): 0.125 mg via ORAL
  Filled 2011-06-30 (×6): qty 1

## 2011-06-30 MED ORDER — VANCOMYCIN HCL IN DEXTROSE 1-5 GM/200ML-% IV SOLN
1000.0000 mg | Freq: Once | INTRAVENOUS | Status: AC
  Administered 2011-06-30: 1000 mg via INTRAVENOUS
  Filled 2011-06-30: qty 200

## 2011-06-30 MED ORDER — DUTASTERIDE 0.5 MG PO CAPS
0.5000 mg | ORAL_CAPSULE | Freq: Every day | ORAL | Status: DC
  Administered 2011-06-30 – 2011-07-04 (×5): 0.5 mg via ORAL
  Filled 2011-06-30 (×5): qty 1

## 2011-06-30 MED ORDER — SENNA 8.6 MG PO TABS
1.0000 | ORAL_TABLET | Freq: Two times a day (BID) | ORAL | Status: DC
  Administered 2011-06-30 – 2011-07-01 (×4): 8.6 mg via ORAL
  Filled 2011-06-30 (×4): qty 1

## 2011-06-30 MED ORDER — ACETAMINOPHEN 650 MG RE SUPP: 650.0000 mg | Freq: Four times a day (QID) | RECTAL | Status: DC | PRN

## 2011-06-30 MED ORDER — ONDANSETRON HCL 4 MG PO TABS: 4.0000 mg | ORAL_TABLET | Freq: Four times a day (QID) | ORAL | Status: DC | PRN

## 2011-06-30 MED ORDER — SIMVASTATIN 20 MG PO TABS
20.0000 mg | ORAL_TABLET | Freq: Every day | ORAL | Status: DC
  Administered 2011-06-30 – 2011-07-03 (×4): 20 mg via ORAL
  Filled 2011-06-30 (×5): qty 1

## 2011-06-30 MED ORDER — DOCUSATE SODIUM 100 MG PO CAPS
100.0000 mg | ORAL_CAPSULE | Freq: Two times a day (BID) | ORAL | Status: DC
  Administered 2011-06-30 – 2011-07-01 (×4): 100 mg via ORAL
  Filled 2011-06-30 (×6): qty 1

## 2011-06-30 NOTE — Consult Note (Signed)
Urology Consult  Referring physician: Bonno  Reason for referral: Urinary retention and gross hematuria.  History of Present Illness: Troy Gregory has been seen by urology several times over his last several admissions. We were initially consulted approximately 3 weeks ago after the patient developed hematuria thought to be secondary to catheter trauma after his cardiac surgery. In the past he was cared for by Dr. Cornelious Bryant in our practice who has subsequently retired. He has had a known BPH and outlet obstruction and is pending urinary retention for approximately the last month. There have been multiple episodes where the patient has traumatically. removed his Foley catheter resulting in gross hematuria and the need to reinsert the Foley catheter. I will need to review our office notes but apparently the patient was in to see Dr. Margarita Grizzle recently and failed a voiding trial. The patient has been now admitted with a probable urosepsis picture with fever and elevated Procan calcitonin levels. His creatinine has increased over baseline. He remains on aspirin but his INR is currently normal. Urine culture data remains pending at this time. Preliminary blood cultures are positive for gram-negative rods.  Past Medical History  Diagnosis Date  . Hypertension   . Atrial fibrillation 06/2011    S/p left sided MAZE. On Coumadin, January, 2013  . Syncope     Near-syncope, etiology is deemed secondary to the excessive heat  . Benign prostatic hypertrophy     but stable  . Chronic anticoagulation   . Coronary artery disease 06/2011    S/p LIMA-LAD, SVG-intermediate coronary (?), and SVG-distal RCA with concurrent MV repair and MAZE  . Mitral regurgitation 06/2011    Mitral valve ring annuloplasty at the time of CABG,, January, 2013  . Shortness of breath   . Chronic kidney disease 12/2010    with a creatinine on discharge of 1.84, was  2.1 on admission  . Calculus of kidney   . H/O hiatal hernia   .  Ejection fraction < 50%     EF previously 50%, however 20-25% by echo and catheterization before CABG   Past Surgical History  Procedure Date  . Circumcision 08/12/2002    because of phimosis  . Coronary artery bypass graft 06/06/2011    Procedure: CORONARY ARTERY BYPASS GRAFTING (CABG);  Surgeon: [name], MD;  Location: [facility] OR;  Service: Open Heart Surgery;  Laterality: N/A;  grafts times three using left internal mammary artery and right leg greater saphenous vein harvested endoscopically  . Maze 06/06/2011    Procedure: MAZE;  Surgeon: [name], MD;  Location: [facility] OR;  Service: Open Heart Surgery;  Laterality: N/A;  . Mitral valve repair 06/06/2011    Procedure: MITRAL VALVE REPAIR (MVR);  Surgeon: [name], MD;  Location: [facility] OR;  Service: Open Heart Surgery;  Laterality: N/A;    Medications:  Scheduled:   . sodium chloride   Intravenous Once  . sodium chloride   Intravenous Once  . acetaminophen  650 mg Rectal Once  . aspirin EC  81 mg Oral Daily  . digoxin  0.125 mg Oral Daily  . docusate sodium  100 mg Oral BID  . dutasteride  0.5 mg Oral Daily  . piperacillin-tazobactam  3.375 g Intravenous Once  . piperacillin-tazobactam (ZOSYN)  IV  3.375 g Intravenous Q8H  . senna  1 tablet Oral BID  . simvastatin  20 mg Oral q1800  . sodium chloride  500 mL Intravenous Once  . sodium chloride  500  mL Intravenous Once  . sodium chloride  500 mL Intravenous Once  . vancomycin  1,000 mg Intravenous Once  . vancomycin  1,000 mg Intravenous QHS  . warfarin  5 mg Oral Once    Allergies: No Known Allergies  Family History  Problem Relation Age of Onset  . Cancer Father   . Lung cancer Father   . Diabetes Father   . Hypertension Mother   . Hypertension Brother     Social History:  reports that he has quit smoking. His smoking use included Cigarettes. He quit after 45 years of use. He has quit using smokeless tobacco. His smokeless tobacco use included Chew. He  reports that he does not drink alcohol or use illicit drugs.  @ROS @  Physical Exam:  Vital signs in last 24 hours: Temp:  [97.4 F (36.3 C)-103.3 F (39.6 C)] 97.4 F (36.3 C) (01/26 1200) Pulse Rate:  [58-88] 68  (01/26 1530) Resp:  [10-20] 17  (01/26 1530) BP: (79-143)/(44-76) 96/58 mmHg (01/26 1530) SpO2:  [97 %-100 %] 100 % (01/26 1530) Weight:  [71.9 kg (158 lb 8.2 oz)-73.8 kg (162 lb 11.2 oz)] 73.8 kg (162 lb 11.2 oz) (01/26 0500)  Constitutional: Vital signs reviewed. WD WN in NAD Head: Normocephalic and atraumatic   Eyes: PERRL, No scleral icterus.  Neck: Supple No  Gross JVD, mass, thyromegaly, or carotid bruit present.  Cardiovascular: RRR Pulmonary/Chest: Normal effort Abdominal: Soft. Non-tender, non-distended, bowel sounds are normal, no masses, organomegaly, or guarding present.  Genitourinary: Extremities: No cyanosis or edema  Neurological: Grossly non-focal.  Skin: Warm,very dry and intact. No rash, cyanosis   Laboratory Data:  Results for orders placed during the hospital encounter of 06/29/11 (from the past 72 hour(s))  URINALYSIS, ROUTINE W REFLEX MICROSCOPIC     Status: Abnormal   Collection Time   06/29/11  8:07 PM      Component Value Range Comment   Color, Urine YELLOW  YELLOW     APPearance CLOUDY (*) CLEAR     Specific Gravity, Urine 1.011  1.005 - 1.030     pH 8.0  5.0 - 8.0     Glucose, UA NEGATIVE  NEGATIVE (mg/dL)    Hgb urine dipstick LARGE (*) NEGATIVE     Bilirubin Urine NEGATIVE  NEGATIVE     Ketones, ur NEGATIVE  NEGATIVE (mg/dL)    Protein, ur >213 (*) NEGATIVE (mg/dL)    Urobilinogen, UA 1.0  0.0 - 1.0 (mg/dL)    Nitrite NEGATIVE  NEGATIVE     Leukocytes, UA LARGE (*) NEGATIVE    URINE MICROSCOPIC-ADD ON     Status: Normal   Collection Time   06/29/11  8:07 PM      Component Value Range Comment   RBC / HPF TOO NUMEROUS TO COUNT  <3 (RBC/hpf)    Urine-Other FIELD OBSCURED BY RBC'S     CBC     Status: Abnormal   Collection Time    06/29/11  8:21 PM      Component Value Range Comment   WBC 4.9  4.0 - 10.5 (K/uL)    RBC 3.79 (*) 4.22 - 5.81 (MIL/uL)    Hemoglobin 11.2 (*) 13.0 - 17.0 (g/dL)    HCT 08.6 (*) 57.8 - 52.0 (%)    MCV 91.0  78.0 - 100.0 (fL)    MCH 29.6  26.0 - 34.0 (pg)    MCHC 32.5  30.0 - 36.0 (g/dL)    RDW 46.9  62.9 - 52.8 (%)  Platelets 137 (*) 150 - 400 (K/uL)   DIFFERENTIAL     Status: Abnormal   Collection Time   06/29/11  8:21 PM      Component Value Range Comment   Neutrophils Relative 98 (*) 43 - 77 (%)    Neutro Abs 4.9  1.7 - 7.7 (K/uL)    Lymphocytes Relative 1 (*) 12 - 46 (%)    Lymphs Abs 0.1 (*) 0.7 - 4.0 (K/uL)    Monocytes Relative 0 (*) 3 - 12 (%)    Monocytes Absolute 0.0 (*) 0.1 - 1.0 (K/uL)    Eosinophils Relative 0  0 - 5 (%)    Eosinophils Absolute 0.0  0.0 - 0.7 (K/uL)    Basophils Relative 0  0 - 1 (%)    Basophils Absolute 0.0  0.0 - 0.1 (K/uL)   LACTIC ACID, PLASMA     Status: Abnormal   Collection Time   06/29/11  8:21 PM      Component Value Range Comment   Lactic Acid, Venous 2.7 (*) 0.5 - 2.2 (mmol/L)   PROCALCITONIN     Status: Normal   Collection Time   06/29/11  8:21 PM      Component Value Range Comment   Procalcitonin 34.66     PROTIME-INR     Status: Abnormal   Collection Time   06/29/11  8:21 PM      Component Value Range Comment   Prothrombin Time 18.1 (*) 11.6 - 15.2 (seconds)    INR 1.47  0.00 - 1.49    DIGOXIN LEVEL     Status: Normal   Collection Time   06/29/11  8:21 PM      Component Value Range Comment   Digoxin Level 0.9  0.8 - 2.0 (ng/mL)   POCT I-STAT, CHEM 8     Status: Abnormal   Collection Time   06/29/11  8:37 PM      Component Value Range Comment   Sodium 135  135 - 145 (mEq/L)    Potassium 4.5  3.5 - 5.1 (mEq/L)    Chloride 97  96 - 112 (mEq/L)    BUN 22  6 - 23 (mg/dL)    Creatinine, Ser 1.61 (*) 0.50 - 1.35 (mg/dL)    Glucose, Bld 096 (*) 70 - 99 (mg/dL)    Calcium, Ion 0.45  1.12 - 1.32 (mmol/L)    TCO2 29  0 - 100  (mmol/L)    Hemoglobin 11.2 (*) 13.0 - 17.0 (g/dL)    HCT 40.9 (*) 81.1 - 52.0 (%)   CULTURE, BLOOD (ROUTINE X 2)     Status: Normal (Preliminary result)   Collection Time   06/29/11  8:45 PM      Component Value Range Comment   Specimen Description BLOOD LEFT ARM      Special Requests BOTTLES DRAWN AEROBIC AND ANAEROBIC 10CC      Setup Time [id number]      Culture        Value: GRAM NEGATIVE RODS     Note: Gram Stain Report Called to,Read Back By and Verified With: RN N. RICHARDSON ON 06/30/11 AT 1605 BY TEDAR   Report Status PENDING     CULTURE, BLOOD (ROUTINE X 2)     Status: Normal (Preliminary result)   Collection Time   06/29/11  8:55 PM      Component Value Range Comment   Specimen Description BLOOD RIGHT HAND      Special Requests BOTTLES DRAWN  AEROBIC AND ANAEROBIC 10CC      Setup Time [id number]      Culture        Value: GRAM NEGATIVE RODS     Note: Gram Stain Report Called to,Read Back By and Verified With: RN N. RICHARDSON ON 06/30/11 AT 1605 BY TEDAR   Report Status PENDING     MRSA PCR SCREENING     Status: Normal   Collection Time   06/30/11  1:34 AM      Component Value Range Comment   MRSA by PCR NEGATIVE  NEGATIVE    INFLUENZA PANEL BY PCR     Status: Normal   Collection Time   06/30/11  2:30 AM      Component Value Range Comment   Influenza A By PCR NEGATIVE  NEGATIVE     Influenza B By PCR NEGATIVE  NEGATIVE     H1N1 flu by pcr NOT DETECTED  NOT DETECTED    COMPREHENSIVE METABOLIC PANEL     Status: Abnormal   Collection Time   06/30/11  2:50 AM      Component Value Range Comment   Sodium 132 (*) 135 - 145 (mEq/L)    Potassium 4.0  3.5 - 5.1 (mEq/L)    Chloride 98  96 - 112 (mEq/L)    CO2 25  19 - 32 (mEq/L)    Glucose, Bld 139 (*) 70 - 99 (mg/dL)    BUN 23  6 - 23 (mg/dL)    Creatinine, Ser 2.95 (*) 0.50 - 1.35 (mg/dL)    Calcium 8.4  8.4 - 10.5 (mg/dL)    Total Protein 5.8 (*) 6.0 - 8.3 (g/dL)    Albumin 2.4 (*) 3.5 - 5.2 (g/dL)    AST 36  0 - 37  (U/L)    ALT 42  0 - 53 (U/L)    Alkaline Phosphatase 95  39 - 117 (U/L)    Total Bilirubin 0.6  0.3 - 1.2 (mg/dL)    GFR calc non Af Amer 34 (*) >90 (mL/min)    GFR calc Af Amer 40 (*) >90 (mL/min)   CBC     Status: Abnormal   Collection Time   06/30/11  2:50 AM      Component Value Range Comment   WBC 15.9 (*) 4.0 - 10.5 (K/uL)    RBC 3.21 (*) 4.22 - 5.81 (MIL/uL)    Hemoglobin 9.4 (*) 13.0 - 17.0 (g/dL)    HCT 28.4 (*) 13.2 - 52.0 (%)    MCV 91.0  78.0 - 100.0 (fL)    MCH 29.3  26.0 - 34.0 (pg)    MCHC 32.2  30.0 - 36.0 (g/dL)    RDW 44.0  10.2 - 72.5 (%)    Platelets 123 (*) 150 - 400 (K/uL)   PROTIME-INR     Status: Abnormal   Collection Time   06/30/11  2:50 AM      Component Value Range Comment   Prothrombin Time 18.1 (*) 11.6 - 15.2 (seconds)    INR 1.47  0.00 - 1.49    TROPONIN I     Status: Normal   Collection Time   06/30/11  2:50 AM      Component Value Range Comment   Troponin I <0.30  <0.30 (ng/mL)   LACTIC ACID, PLASMA     Status: Abnormal   Collection Time   06/30/11 11:55 AM      Component Value Range Comment   Lactic Acid, Venous 2.7 (*)  0.5 - 2.2 (mmol/L)    Recent Results (from the past 240 hour(s))  CULTURE, BLOOD (ROUTINE X 2)     Status: Normal (Preliminary result)   Collection Time   06/29/11  8:45 PM      Component Value Range Status Comment   Specimen Description BLOOD LEFT ARM   Final    Special Requests BOTTLES DRAWN AEROBIC AND ANAEROBIC 10CC   Final    Setup Time [id number]   Final    Culture     Final    Value: GRAM NEGATIVE RODS     Note: Gram Stain Report Called to,Read Back By and Verified With: RN N. RICHARDSON ON 06/30/11 AT 1605 BY TEDAR   Report Status PENDING   Incomplete   CULTURE, BLOOD (ROUTINE X 2)     Status: Normal (Preliminary result)   Collection Time   06/29/11  8:55 PM      Component Value Range Status Comment   Specimen Description BLOOD RIGHT HAND   Final    Special Requests BOTTLES DRAWN AEROBIC AND ANAEROBIC 10CC    Final    Setup Time [id number]   Final    Culture     Final    Value: GRAM NEGATIVE RODS     Note: Gram Stain Report Called to,Read Back By and Verified With: RN N. RICHARDSON ON 06/30/11 AT 1605 BY TEDAR   Report Status PENDING   Incomplete   MRSA PCR SCREENING     Status: Normal   Collection Time   06/30/11  1:34 AM      Component Value Range Status Comment   MRSA by PCR NEGATIVE  NEGATIVE  Final    Creatinine:  Basename 06/30/11 0250 06/29/11 2037  CREATININE 1.85* 1.70*   Baseline Creatinine: ? 1.2 to 2.0 range  Impression/Assessment:  Ongoing urinary retention presumptively secondary to outlet obstruction from BPH along with a possible component of hypotonic bladder due to deconditioning. The patient has had intermittent gross hematuria undoubtedly due to catheter trauma. The patient now has a picture consistent with urosepsis but clinically he is responding well to antibiotic therapy.  Plan:  Currently Troy Gregory urine is clear there is no need for any acute urologic intervention. Obviously a decision will need to be made sooner rather later as to whether he is a candidate to go ahead with a definitive procedure to resect his prostate or whether one should proceed with placement of a suprapubic tube until his medical situation improves. I am concerned about leaving an indwelling Foley catheter and his situation due to the repeated episodes of traumatic Foley removal by the patient. We let Dr. Margarita Grizzle know about the patient's admission and see if he can discuss things with the family early next week.  Stewart Sasaki S 06/30/2011, 5:17 PM

## 2011-06-30 NOTE — Progress Notes (Signed)
*  PRELIMINARY RESULTS* Echocardiogram 2D Echocardiogram has been performed.  Troy Gregory 06/30/2011, 11:50 AM

## 2011-06-30 NOTE — Progress Notes (Signed)
CRITICAL VALUE ALERT  Critical value received:  Gram negative rods is all 4 blood cultures   Date of notification:  06/30/2011 Time of notification:  1610  Critical value read back:yes  Nurse who received alert:  n Thayne Cindric   MD notified (1st page): Clent Ridges  Time of first page:  1615  MD notified (2nd page):  Time of second page:  Responding MD:  Clent Ridges   Time MD responded:  1620

## 2011-06-30 NOTE — H&P (Addendum)
PCP:   Kaleen Mask, MD, MD   Wife states this was prior PCP.  She says Dr. Candyce Churn has seen the pt at the rehab, she likes him and she wants him to be the new PCP.  Urology was set up to see Dr. Margarita Grizzle as prior urologist retired? Dr. Tyrone Sage cardiology  Chief Complaint:  Hematuria, chills   HPI: 75yoM with h/o AFib 10/2010, severe ischemic CMP and severe CAD s/p CABG with concurrent MAZE  and MV repair in 06/2011, BPH, CKD with baseline Cr previously noted to be 1.8-2.0, now  presents with fevers, hematuria, thrombocytopenia.   Pt was recently cathed on 06/07/2011 with signficant CAD noted involving LMCA, LAD, LCx, and EF  20-25% by echo. CT surgery was consulted. Pt had CABG x3 (LIMA-LAD, SVG-intermediate  coronary?, and SVG-distal RCA), left sided MAZE procedure, and mitral valve repair with ring  annuloplasty. He had postop confusion and mentation slowly improved. Had postop Afib,  restarted AFib.   During his hospitalization post CABG, he was noted to have gross hematuria and his Foley was  not draining. Thought due to catheter trauma. 14 french removed, another 59F Coude guage  placed. It was removed, however had urinary retention and associated ARF with Cr 1.7 going to  2.16. Foley cath was replaced 1/6.   The pt is not able to give history, so it's provided by wife, who states that he's been at  [facility] rehab for the past 10d or so, where he's pulled out his Foley a couple times and had it  changed a couple times. There's been off/on light red hematuria that comes and goes, not  severe until tonight. He followed up with Urology this past Monday and they took the Foley  out, but had to replace it within the next 8 hrs for retention.   Tonight, they noted his Foley had decreased output, so had to change it and this may have been  quite tramatic bc there was a blood clots and clumps around his penis and in the Foley, and  now there is dark red urine in the  Foley worse than it's been for past couple weeks. Around  the same time, he started having chills, and was sent to the ED.   In the ED, pt was febrile to 103.3, rest of vitals stable. Labs with renal fxn 22/1.7 up from  prior Cr 1.28 on 1/15. Lactate 2.7 and procalcitonin 34.66. WBC 4.9, Hct 34.5, plts down to  137 from 277 previously. Digoxin level normal. UA with >300 protein, large LE/neg nitrite, too  numerous to count RBC's. BCx and UCx pending. CXR without acute process.   ROS: Overall pt was improving slowly, but did have very small PO intake, and vomited once last  Wednesday only. He's been confused off/on over the past month since surgery. Up until tonight,  there was no fevers, chills, sweats, no cough, diarrhea, abd pain, not feeling systemically  ill, no flu symptoms. He has a sacral ulcer. No BLE swelling or swelling elsewhere. No chest  pain, SOB, or other cardiopulmonary symptoms and wife states from cardiac perspective that's  all fine.   Past Medical History  Diagnosis Date  . Hypertension   . Atrial fibrillation 06/2011    S/p left sided MAZE. On Coumadin  . Syncope     Near-syncope, etiology is deemed secondary to the excessive heat  . Benign prostatic hypertrophy     but stable  . Chronic anticoagulation   .  Coronary artery disease 06/2011    S/p LIMA-LAD, SVG-intermediate coronary (?), and SVG-distal RCA with concurrent MV repair and MAZE  . Mitral regurgitation 06/2011    MV repair   . Shortness of breath   . Chronic kidney disease 12/2010    with a creatinine on discharge of 1.84, was  2.1 on admission  . Calculus of kidney   . H/O hiatal hernia     Past Surgical History  Procedure Date  . Circumcision 08/12/2002    because of phimosis  . Coronary artery bypass graft 06/06/2011    Procedure: CORONARY ARTERY BYPASS GRAFTING (CABG);  Surgeon: [name], MD;  Location: [facility] OR;  Service: Open Heart Surgery;  Laterality: N/A;  grafts times three using left  internal mammary artery and right leg greater saphenous vein harvested endoscopically  . Maze 06/06/2011    Procedure: MAZE;  Surgeon: [name], MD;  Location: [facility] OR;  Service: Open Heart Surgery;  Laterality: N/A;  . Mitral valve repair 06/06/2011    Procedure: MITRAL VALVE REPAIR (MVR);  Surgeon: [name], MD;  Location: [facility] OR;  Service: Open Heart Surgery;  Laterality: N/A;    Medications:  HOME MEDS: Reconciled with [facility] list  Prior to Admission medications   Medication Sig Start Date End Date Taking? Authorizing Provider  aspirin EC 81 MG EC tablet Take 1 tablet (81 mg total) by mouth daily. 06/13/11 06/12/12 Yes Rowe Clack, PA  carvedilol (COREG) 12.5 MG tablet Take 1 tablet (12.5 mg total) by mouth 2 (two) times daily with a meal. 06/19/11 06/18/12 Yes Donielle Margaretann Loveless, PA  digoxin (LANOXIN) 0.125 MG tablet Take 1 tablet (0.125 mg total) by mouth daily. 06/19/11 06/18/12 Yes Donielle Margaretann Loveless, PA  dutasteride (AVODART) 0.5 MG capsule Take 0.5 mg by mouth daily.    Yes Historical Provider, MD  furosemide (LASIX) 40 MG tablet Take 1 tablet (40 mg total) by mouth every morning. 06/17/11  Yes Donielle Margaretann Loveless, PA  guaiFENesin (MUCINEX) 600 MG 12 hr tablet Take 600 mg by mouth every 12 (twelve) hours as needed. congestion 06/17/11 06/29/11 Yes Donielle Margaretann Loveless, PA  lisinopril (PRINIVIL,ZESTRIL) 5 MG tablet Take 1 tablet (5 mg total) by mouth every morning. 06/17/11  Yes Donielle Margaretann Loveless, PA  potassium chloride SA (K-DUR,KLOR-CON) 20 MEQ tablet Take 1 tablet (20 mEq total) by mouth daily. 06/17/11  Yes Donielle Margaretann Loveless, PA  simvastatin (ZOCOR) 20 MG tablet Take 1 tablet (20 mg total) by mouth daily at 6 PM. 06/13/11 06/12/12 Yes Rowe Clack, PA  Tamsulosin HCl (FLOMAX) 0.4 MG CAPS Take 1 capsule (0.4 mg total) by mouth at bedtime. 06/17/11  Yes Donielle Margaretann Loveless, PA  traMADol (ULTRAM) 50 MG tablet Take 50 mg by mouth every 6 (six) hours as needed. pain 06/13/11  Yes Rowe Clack, PA  warfarin (COUMADIN) 3 MG tablet Take 3 mg by mouth daily.   Yes Historical Provider, MD   Allergies:  No Known Allergies  Social History:   reports that he has quit smoking. His smoking use included Cigarettes. He quit after 45 years of use. He has quit using smokeless tobacco. His smokeless tobacco use included Chew. He reports that he does not drink alcohol or use illicit drugs.  Has a wife Eutha [phone] and [phone]. Currently at rehab [facility] after cardiac surgery 06/2011.  Family History: Family History  Problem Relation Age of Onset  . Cancer Father   .  Lung cancer Father   . Diabetes Father   . Hypertension Mother   . Hypertension Brother    Physical Exam: Filed Vitals:   06/29/11 2015 06/29/11 2315 06/30/11 0015 06/30/11 0020  BP: 126/69 96/55  102/57  Pulse: 86 81 80 81  Temp:    102.9 F (39.4 C)  TempSrc:    Rectal  Resp: 18 18 18 19   SpO2: 100% 100% 100% 100%   Blood pressure 102/57, pulse 81, temperature 102.9 F (39.4 C), temperature source Rectal, resp. rate 19, SpO2 100.00%.  Gen: Tall, thin, minimally to moderately ill appearing M, feels warm to touch, doesn't really  follow conversation but can answer questions with simple answers. No distress though.  Recognizes family around him HEENT: Pupils round, reactive, irises and sclera clear and normal appearing. EOMI. Mouth quite  dry appearing Lungs: CTAB although exam was limited, no w/c/r grossly  Heart: Regular, not tachycardic, with S1/2 appreciated and no gross m/g heard Abd: Soft, NT ND, benign, no facial grimacing, unremarkable exam Extrem: Warm/hot to touch, bilateral radials palpable. No BLE edema noted. Normal bulk.  Neuro: Fatigued and moderately inattentive but awake and answers questions. Not oriented -  place is "421," time is 2000's, cannot tell me he just had a major surgery. CN 2-12 intact.  Able to sit forward with some assitance, and moves arms on his own. Grossly non-focal    Skin: 1.5cm sacral ulcer stage II, with surrounding pink area but doesn't look infected, no  pus, overall clean appearing   Labs & Imaging Results for orders placed during the hospital encounter of 06/29/11 (from the past 48 hour(s))  URINALYSIS, ROUTINE W REFLEX MICROSCOPIC     Status: Abnormal   Collection Time   06/29/11  8:07 PM      Component Value Range Comment   Color, Urine YELLOW  YELLOW     APPearance CLOUDY (*) CLEAR     Specific Gravity, Urine 1.011  1.005 - 1.030     pH 8.0  5.0 - 8.0     Glucose, UA NEGATIVE  NEGATIVE (mg/dL)    Hgb urine dipstick LARGE (*) NEGATIVE     Bilirubin Urine NEGATIVE  NEGATIVE     Ketones, ur NEGATIVE  NEGATIVE (mg/dL)    Protein, ur >397 (*) NEGATIVE (mg/dL)    Urobilinogen, UA 1.0  0.0 - 1.0 (mg/dL)    Nitrite NEGATIVE  NEGATIVE     Leukocytes, UA LARGE (*) NEGATIVE    URINE MICROSCOPIC-ADD ON     Status: Normal   Collection Time   06/29/11  8:07 PM      Component Value Range Comment   RBC / HPF TOO NUMEROUS TO COUNT  <3 (RBC/hpf)    Urine-Other FIELD OBSCURED BY RBC'S     CBC     Status: Abnormal   Collection Time   06/29/11  8:21 PM      Component Value Range Comment   WBC 4.9  4.0 - 10.5 (K/uL)    RBC 3.79 (*) 4.22 - 5.81 (MIL/uL)    Hemoglobin 11.2 (*) 13.0 - 17.0 (g/dL)    HCT 67.3 (*) 41.9 - 52.0 (%)    MCV 91.0  78.0 - 100.0 (fL)    MCH 29.6  26.0 - 34.0 (pg)    MCHC 32.5  30.0 - 36.0 (g/dL)    RDW 37.9  02.4 - 09.7 (%)    Platelets 137 (*) 150 - 400 (K/uL)   DIFFERENTIAL  Status: Abnormal   Collection Time   06/29/11  8:21 PM      Component Value Range Comment   Neutrophils Relative 98 (*) 43 - 77 (%)    Neutro Abs 4.9  1.7 - 7.7 (K/uL)    Lymphocytes Relative 1 (*) 12 - 46 (%)    Lymphs Abs 0.1 (*) 0.7 - 4.0 (K/uL)    Monocytes Relative 0 (*) 3 - 12 (%)    Monocytes Absolute 0.0 (*) 0.1 - 1.0 (K/uL)    Eosinophils Relative 0  0 - 5 (%)    Eosinophils Absolute 0.0  0.0 - 0.7 (K/uL)    Basophils Relative 0  0 - 1  (%)    Basophils Absolute 0.0  0.0 - 0.1 (K/uL)   LACTIC ACID, PLASMA     Status: Abnormal   Collection Time   06/29/11  8:21 PM      Component Value Range Comment   Lactic Acid, Venous 2.7 (*) 0.5 - 2.2 (mmol/L)   PROCALCITONIN     Status: Normal   Collection Time   06/29/11  8:21 PM      Component Value Range Comment   Procalcitonin 34.66     PROTIME-INR     Status: Abnormal   Collection Time   06/29/11  8:21 PM      Component Value Range Comment   Prothrombin Time 18.1 (*) 11.6 - 15.2 (seconds)    INR 1.47  0.00 - 1.49    DIGOXIN LEVEL     Status: Normal   Collection Time   06/29/11  8:21 PM      Component Value Range Comment   Digoxin Level 0.9  0.8 - 2.0 (ng/mL)   POCT I-STAT, CHEM 8     Status: Abnormal   Collection Time   06/29/11  8:37 PM      Component Value Range Comment   Sodium 135  135 - 145 (mEq/L)    Potassium 4.5  3.5 - 5.1 (mEq/L)    Chloride 97  96 - 112 (mEq/L)    BUN 22  6 - 23 (mg/dL)    Creatinine, Ser 1.61 (*) 0.50 - 1.35 (mg/dL)    Glucose, Bld 096 (*) 70 - 99 (mg/dL)    Calcium, Ion 0.45  1.12 - 1.32 (mmol/L)    TCO2 29  0 - 100 (mmol/L)    Hemoglobin 11.2 (*) 13.0 - 17.0 (g/dL)    HCT 40.9 (*) 81.1 - 52.0 (%)    Dg Chest Port 1 View  06/29/2011  *RADIOLOGY REPORT*  Clinical Data: Altered mental status.  Weakness.  Atrial fibrillation.  Coronary artery disease peri  PORTABLE CHEST - 1 VIEW  Comparison: 06/19/2011  Findings: Low lung volumes are seen.  Patient is partially rotated. Cardiomegaly stable.  Both lungs are clear.  The patient has undergone previous CABG and mitral valve replacement.  IMPRESSION: Stable cardiomegaly and low lung volumes.  No acute findings.  Original Report Authenticated By: Danae Orleans, M.D.   ECG: sinus with P waves noted in V1-3, 82 bpm, normal axis, sub 1mm STD in V5-6. TWI in V4-6  but these were seen previously, the STD were not  Impression Present on Admission:  .SIRS (systemic inflammatory response  syndrome) .Hematuria .Chronic kidney disease .Thrombocytopenia  75yoM with h/o AFib 10/2010, severe ischemic CMP and severe CAD s/p CABG with concurrent MAZE  and MV repair in 06/2011, BPH, CKD with baseline Cr previously noted to be 1.8-2.0, now  presents  with fevers, hematuria, thrombocytopenia.   1. Fevers, minimally elevated lactate, elevated procalcitonin: Concerning for SIRS/sepsis, although WBC is low. Unclear source, although urinary is high on differential given indwelling Foley and recent manipulation. BP's are on the low side, improving with IVF's, although review of recent admission shows they bounced around a bit.   UA doesn't shows any WBC's or bacteria, but may be obscured by RBC's, and does have large LE. Cultures are pending. CXR is clear. Chest incision looks fine. ? endocarditis vs other deep cardiac/sternal infection given recent surgery. Sacral wound looks clean and non-infected. No other localizing symptoms to suggest flu.   - ABx: Vanc/Zosyn for SIRS/sepsis, follow up cultures.  - Influenza swab  - Holding carvedilol, lasix, lisinopril, tamsulosin  2. ? acute vs chronic renal insufficiency: Cr 1.7 is above 1.2 on discharge, however notes and review of labs shows his "baseline" previously before surgery was 1.8-2.0, so not sure his current Cr is any worse than baseline. Will just monitor this for now, giving IVF's gently.   3. Hematuria/thrombocytopenia: INR is only 1.47, but plt count has dropped to 137. Suspect the  hematuria is causing plt consumption and not thrombocytopenia causing hematuria. Hct is not  below level on discharge.   - Urology already consulted, appreciated. Keep Foley in place, monitor for clotting/retention - Holding tamsulosin for hypoTN, continue dutasteride. Will continue Coumadin / ASA for now given cardiac indications - F/u repeat CBC to ensure plt drop is real, and continue to monitor  4. Less than 1mm ST depressions, lateral leads: no  chest pain. Will trend ECG and get  screening Trop   5. CAD s/p CABG, MVR: continue home ASA 81, statin 6. AFib s/p MAZE: On Coumadin but subtherapeutic, hold carvedilol, continue digoxin  SDU, admit to Dr. Kevan Ny. Not 100% clear this is pt's full PCP but have left message for Bennye Alm and if there's a problem, instructed to contact [facility] Full code, discussed with wife  Other plans as per orders.  Fanta Wimberley 06/30/2011, 1:53 AM

## 2011-06-30 NOTE — Progress Notes (Signed)
Subjective: 34 yom with history of recent CABG and mitral valve repair 06/07/11 presents with hematuria due to foley trauma, fever, AMS.  Wife reports that energy level and appetite have been low and has had confusion since surgery.  Has pulled foley at least 5 times over past weeks.  Has had hematuria and foley blockage intermittently.  Objective: Vital signs in last 24 hours: Temp:  [98.1 F (36.7 C)-103.3 F (39.6 C)] 98.3 F (36.8 C) (01/26 0800) Pulse Rate:  [63-88] 65  (01/26 0830) Resp:  [13-20] 16  (01/26 0830) BP: (82-143)/(44-76) 91/60 mmHg (01/26 0830) SpO2:  [97 %-100 %] 99 % (01/26 0830) Weight:  [71.9 kg (158 lb 8.2 oz)-73.8 kg (162 lb 11.2 oz)] 73.8 kg (162 lb 11.2 oz) (01/26 0500) Weight change:  Last BM Date: 06/28/11  Intake/Output from previous day: 01/25 0701 - 01/26 0700 In: 2500 [I.V.:750; IV Piggyback:1750] Out: 360 [Urine:360] Intake/Output this shift: Total I/O In: 1352.5 [I.V.:1340; IV Piggyback:12.5] Out: 425 [Urine:425]  General appearance: cooperative, fatigued and no distress Head: Normocephalic, without obvious abnormality, atraumatic, mucus membranes dry Neck: no adenopathy, no carotid bruit, no JVD and supple, symmetrical, trachea midline Resp: clear to auscultation bilaterally and shallow resps, no distress Chest wall: no tenderness, sternal scar well healed Cardio: regular rate and rhythm, S1, S2 normal, no murmur, click, rub or gallop GI: soft, non-tender; bowel sounds normal; no masses,  no organomegaly Extremities: extremities normal, atraumatic, no cyanosis or edema Skin: Skin color, texture, turgor normal. No rashes or lesions or on inch skin tear over sacrum with surounding erythema, no drainage, no edema Neurologic: Grossly normal  Lab Results:  Basename 06/30/11 0250 06/29/11 2037 06/29/11 2021  WBC 15.9* -- 4.9  HGB 9.4* 11.2* --  HCT 29.2* 33.0* --  PLT 123* -- 137*   BMET  Basename 06/30/11 0250 06/29/11 2037  NA 132* 135  K  4.0 4.5  CL 98 97  CO2 25 --  GLUCOSE 139* 103*  BUN 23 22  CREATININE 1.85* 1.70*  CALCIUM 8.4 --    Studies/Results: Dg Chest Port 1 View  06/29/2011  *RADIOLOGY REPORT*  Clinical Data: Altered mental status.  Weakness.  Atrial fibrillation.  Coronary artery disease peri  PORTABLE CHEST - 1 VIEW  Comparison: 06/19/2011  Findings: Low lung volumes are seen.  Patient is partially rotated. Cardiomegaly stable.  Both lungs are clear.  The patient has undergone previous CABG and mitral valve replacement.  IMPRESSION: Stable cardiomegaly and low lung volumes.  No acute findings.  Original Report Authenticated By: Danae Orleans, M.D.    Medications:  Scheduled:   . sodium chloride   Intravenous Once  . acetaminophen  650 mg Rectal Once  . aspirin EC  81 mg Oral Daily  . digoxin  0.125 mg Oral Daily  . docusate sodium  100 mg Oral BID  . dutasteride  0.5 mg Oral Daily  . piperacillin-tazobactam  3.375 g Intravenous Once  . piperacillin-tazobactam (ZOSYN)  IV  3.375 g Intravenous Q8H  . senna  1 tablet Oral BID  . simvastatin  20 mg Oral q1800  . sodium chloride  500 mL Intravenous Once  . sodium chloride  500 mL Intravenous Once  . sodium chloride  500 mL Intravenous Once  . vancomycin  1,000 mg Intravenous Once  . vancomycin  1,000 mg Intravenous QHS  . warfarin  5 mg Oral Once   Continuous:   . sodium chloride 125 mL/hr (06/30/11 0900)   ZOX:[id number], acetaminophen,  ondansetron (ZOFRAN) IV, ondansetron  Assessment/Plan: Principal Problem: 1) SIRS: secondary to UTI.  Concern for impending sepsis with hypotension.  Currently on broad coverage with vancomycin and zosyn.  Blood and urine cultures pending. Transfer to ICU status. Have discussed with CCM.  For now will continue to manage.  If need for central line/ pressors develops will transfer care to CCM. 2) hypotension: due to #1, does improve with 500 ml fluid bolus to SBP in the 90's.  Will continue with fluid  resuscitation with goal MAP >65. If persistently low today will need to consider pressor support.  ECHO is pending. 3) recent cardiac surgery: ECHO pending.  Cardiology aware of patient. Sternal scar is well healed, no sign of infection.  No chest pain. 4) hematuria due to urethral trauma: urology to see today 5) acute on chronic kidney injury: due to hypotension, continue fluids. 6) anemia: possibly dilutional with volume given over past few days, also some blood loss from urethral injury. 7) ICU status.   LOS: 1 day   Fidel Caggiano 06/30/2011, 11:05 AM

## 2011-06-30 NOTE — Consult Note (Signed)
CARDIOLOGY CONSULT NOTE  Patient ID: Troy Gregory MRN: [id number] DOB/AGE: November 05, 1935 76 y.o.  Admit date: 06/29/2011 Referring Physician Primary Hollice Gong, MD, MD Primary Cardiologist  Peter Swaziland Reason for Consultation:   Recent CABG, mitral valve ring, atrial fib with a Maze procedure  HPI: The patient is admitted with an infectious process. It is felt to be related to his urinary tract. At first his blood pressure was low. He's been receiving fluids and antibiotics and his blood pressure is much more stable. His overall cardiac status is stable. However he has significant cardiac disease with recent cardiac surgery and we are asked to see the patient in follow. Dr. Swaziland is the patient's primary cardiologist.  Historically the patient had an ejection fraction in the 50% range. Cardiac catheterization revealed severe disease. The patient underwent cardiac surgery. Originally he was to be discharged June 13, 2011. The discharge was delayed and he was discharged June 19, 2011.Marland Kitchen He received CABG x3 with LIMA. He also received a mitral valve ring annuloplasty. A left-sided maze procedure was done for his atrial fibrillation. Coumadin was used after this time. The patient has had no significant cardiac symptoms.  Review of systems : Patient denies headache, change in vision, change in hearing, nausea vomiting,. All other systems are reviewed and are negative other than the history of present illness.  Past Medical History  Diagnosis Date  . Hypertension   . Atrial fibrillation 06/2011    S/p left sided MAZE. On Coumadin, January, 2013  . Syncope     Near-syncope, etiology is deemed secondary to the excessive heat  . Benign prostatic hypertrophy     but stable  . Chronic anticoagulation   . Coronary artery disease 06/2011    S/p LIMA-LAD, SVG-intermediate coronary (?), and SVG-distal RCA with concurrent MV repair and MAZE  . Mitral regurgitation 06/2011    Mitral  valve ring annuloplasty at the time of CABG,, January, 2013  . Shortness of breath   . Chronic kidney disease 12/2010    with a creatinine on discharge of 1.84, was  2.1 on admission  . Calculus of kidney   . H/O hiatal hernia   . Ejection fraction < 50%     EF previously 50%, however 20-25% by echo and catheterization before CABG    Family History  Problem Relation Age of Onset  . Cancer Father   . Lung cancer Father   . Diabetes Father   . Hypertension Mother   . Hypertension Brother     History   Social History  . Marital Status: Married    Spouse Name: N/A    Number of Children: N/A  . Years of Education: N/A   Occupational History  . Not on file.   Social History Main Topics  . Smoking status: Former Smoker -- 45 years    Types: Cigarettes  . Smokeless tobacco: Former Neurosurgeon    Types: Chew  . Alcohol Use: No  . Drug Use: No  . Sexually Active: Not on file   Other Topics Concern  . Not on file   Social History Narrative   Has a wife Eutha [phone] and [phone]. Currently at rehab [facility] after cardiac surgery 06/2011.     Past Surgical History  Procedure Date  . Circumcision 08/12/2002    because of phimosis  . Coronary artery bypass graft 06/06/2011    Procedure: CORONARY ARTERY BYPASS GRAFTING (CABG);  Surgeon: [name], MD;  Location: [facility] OR;  Service: Open Heart Surgery;  Laterality: N/A;  grafts times three using left internal mammary artery and right leg greater saphenous vein harvested endoscopically  . Maze 06/06/2011    Procedure: MAZE;  Surgeon: [name], MD;  Location: [facility] OR;  Service: Open Heart Surgery;  Laterality: N/A;  . Mitral valve repair 06/06/2011    Procedure: MITRAL VALVE REPAIR (MVR);  Surgeon: [name], MD;  Location[facility] OR;  Service: Open Heart Surgery;  Laterality: N/A;     Prescriptions prior to admission  Medication Sig Dispense Refill  . aspirin EC 81 MG EC tablet Take 1 tablet (81 mg total) by mouth daily.        . carvedilol (COREG) 12.5 MG tablet Take 1 tablet (12.5 mg total) by mouth 2 (two) times daily with a meal.  60 tablet  1  . digoxin (LANOXIN) 0.125 MG tablet Take 1 tablet (0.125 mg total) by mouth daily.      Marland Kitchen dutasteride (AVODART) 0.5 MG capsule Take 0.5 mg by mouth daily.       . furosemide (LASIX) 40 MG tablet Take 1 tablet (40 mg total) by mouth every morning.  5 tablet  0  . guaiFENesin (MUCINEX) 600 MG 12 hr tablet Take 600 mg by mouth every 12 (twelve) hours as needed. congestion      . lisinopril (PRINIVIL,ZESTRIL) 5 MG tablet Take 1 tablet (5 mg total) by mouth every morning.  30 tablet  1  . potassium chloride SA (K-DUR,KLOR-CON) 20 MEQ tablet Take 1 tablet (20 mEq total) by mouth daily.  5 tablet  0  . simvastatin (ZOCOR) 20 MG tablet Take 1 tablet (20 mg total) by mouth daily at 6 PM.  30 tablet  1  . Tamsulosin HCl (FLOMAX) 0.4 MG CAPS Take 1 capsule (0.4 mg total) by mouth at bedtime.  30 capsule    . traMADol (ULTRAM) 50 MG tablet Take 50 mg by mouth every 6 (six) hours as needed. pain      . warfarin (COUMADIN) 3 MG tablet Take 3 mg by mouth daily.        Physical Exam: Blood pressure 96/58, pulse 68, temperature 97.4 F (36.3 C), temperature source Oral, resp. rate 17, height 6\' 1"  (1.854 m), weight 162 lb 11.2 oz (73.8 kg), SpO2 100.00%.   The patient is frail but stable. He is oriented to person time and place. Affect is normal. There is no jugular venous distention. Lungs reveal scattered rhonchi. Cardiac exam reveals an S1 and S2 and a soft systolic murmur. The abdomen is soft. There's no significant peripheral edema. There no significant skin rashes.   Labs:   Lab Results  Component Value Date   WBC 15.9* 06/30/2011   HGB 9.4* 06/30/2011   HCT 29.2* 06/30/2011   MCV 91.0 06/30/2011   PLT 123* 06/30/2011    Lab 06/30/11 0250  NA 132*  K 4.0  CL 98  CO2 25  BUN 23  CREATININE 1.85*  CALCIUM 8.4  PROT 5.8*  BILITOT 0.6  ALKPHOS 95  ALT 42  AST 36  GLUCOSE  139*   Lab Results  Component Value Date   CKTOTAL 175 12/28/2010   CKMB 3.8 12/28/2010   TROPONINI <0.30 06/30/2011    No results found for this basename: CHOL   No results found for this basename: HDL   No results found for this basename: LDLCALC   No results found for this basename: TRIG   No results found  for this basename: CHOLHDL   No results found for this basename: LDLDIRECT      Radiology: Dg Chest Port 1 View  06/29/2011  *RADIOLOGY REPORT*  Clinical Data: Altered mental status.  Weakness.  Atrial fibrillation.  Coronary artery disease peri  PORTABLE CHEST - 1 VIEW  Comparison: 06/19/2011  Findings: Low lung volumes are seen.  Patient is partially rotated. Cardiomegaly stable.  Both lungs are clear.  The patient has undergone previous CABG and mitral valve replacement.  IMPRESSION: Stable cardiomegaly and low lung volumes.  No acute findings.  Original Report Authenticated By: Danae Orleans, M.D.   EKG: I have reviewed the monitor strips. The rhythm is regular. P-wave amplitude is low compatible with his Maze procedure. Unfortunately despite all my searching I cannot find an EKG that I can view.  ASSESSMENT AND PLAN    *SIRS (systemic inflammatory response syndrome)  This is the major problem at this time. The patient is receiving fluids and antibiotics and he is improving. The patient does have a mitral ring annuloplasty. He does not have a new mitral valve. However there is still some risk of infection related to the annuloplasty ring. Hopefully his infection can be treated well.   Atrial fibrillation   The patient had preoperative atrial fibrillation. He had some postoperatively. At this time his rhythm is regular. I think that it is probably sinus. However the 12 leads are not available to me at this time. No change in therapy is needed.   Mitral regurgitation    Mitral regurgitation was treated with a mitral ring annuloplasty. No further workup is needed.   Ejection  fraction < 50% Before surgery his ejection fraction was 20-25%. An echo has been ordered. It will be very meaningful to see if he's had improvement in his LV function since his surgery. With low blood pressure we will not be pushing any medications at this time. Later his meds can be further adjusted.  CAD  The patient had bypass surgery and is doing well.  We will follow him with you.    06/30/2011, 3:43 PM

## 2011-06-30 NOTE — Progress Notes (Signed)
ANTIBIOTIC CONSULT NOTE - INITIAL  Pharmacy Consult for Vancomycin and Zosyn and warfarin Indication: rule out sepsis/afib  No Known Allergies  Patient Measurements: Height: 6\' 1"  (185.4 cm) Weight: 158 lb 8.2 oz (71.9 kg) IBW/kg (Calculated) : 79.9  Adjusted Body Weight:   Vital Signs: Temp: 102.9 F (39.4 C) (01/26 0020) Temp src: Rectal (01/26 0020) BP: 88/45 mmHg (01/26 0315) Pulse Rate: 72  (01/26 0315) Intake/Output from previous day:   Intake/Output from this shift:    Labs:  [facility] 06/29/11 2037 06/29/11 2021  WBC -- 4.9  HGB 11.2* 11.2*  PLT -- 137*  LABCREA -- --  CREATININE 1.70* --   Estimated Creatinine Clearance: 39.4 ml/min (by C-G formula based on Cr of 1.7). No results found for this basename: VANCOTROUGH:2,VANCOPEAK:2,VANCORANDOM:2,GENTTROUGH:2,GENTPEAK:2,GENTRANDOM:2,TOBRATROUGH:2,TOBRAPEAK:2,TOBRARND:2,AMIKACINPEAK:2,AMIKACINTROU:2,AMIKACIN:2, in the last 72 hours   Microbiology: Recent Results (from the past 720 hour(s))  SURGICAL PCR SCREEN     Status: Normal   Collection Time   06/04/11  8:38 AM      Component Value Range Status Comment   MRSA, PCR NEGATIVE  NEGATIVE  Final    Staphylococcus aureus NEGATIVE  NEGATIVE  Final   MRSA PCR SCREENING     Status: Normal   Collection Time   06/30/11  1:34 AM      Component Value Range Status Comment   MRSA by PCR NEGATIVE  NEGATIVE  Final     Medical History: Past Medical History  Diagnosis Date  . Hypertension   . Atrial fibrillation 06/2011    S/p left sided MAZE. On Coumadin  . Syncope     Near-syncope, etiology is deemed secondary to the excessive heat  . Benign prostatic hypertrophy     but stable  . Chronic anticoagulation   . Coronary artery disease 06/2011    S/p LIMA-LAD, SVG-intermediate coronary (?), and SVG-distal RCA with concurrent MV repair and MAZE  . Mitral regurgitation 06/2011    MV repair   . Shortness of breath   . Chronic kidney disease 12/2010    with a creatinine  on discharge of 1.84, was  2.1 on admission  . Calculus of kidney   . H/O hiatal hernia     Medications:  Anti-infectives     Start     Dose/Rate Route Frequency Ordered Stop   06/30/11 2200   vancomycin (VANCOCIN) IVPB 1000 mg/200 mL premix        1,000 mg 200 mL/hr over 60 Minutes Intravenous Daily at bedtime 06/30/11 0326     06/30/11 0800   piperacillin-tazobactam (ZOSYN) IVPB 3.375 g        3.375 g 12.5 mL/hr over 240 Minutes Intravenous Every 8 hours 06/30/11 0154     06/30/11 0200   vancomycin (VANCOCIN) IVPB 1000 mg/200 mL premix        1,000 mg 200 mL/hr over 60 Minutes Intravenous  Once 06/30/11 0154 06/30/11 0318   06/30/11 0200   piperacillin-tazobactam (ZOSYN) IVPB 3.375 g        3.375 g 100 mL/hr over 30 Minutes Intravenous  Once 06/30/11 0154 06/30/11 0248         Assessment: Patient with r/o sepsis and afib.  INR < 2.  Home warfarin dose listed as 3mg  daily.  Goal of Therapy:  Vancomycin trough level 15-20 mcg/ml INR 2-3, Zosyn based on renal function   Plan:  Measure antibiotic drug levels at steady state Follow up culture results Vancomycin 1gm iv q24, Zosyn 3.375g IV Q8H infused over 4hrs. Daily  PT/INR Warfarin 5mg  po x1 at [address], [place] 06/30/2011,3:29 AM

## 2011-06-30 NOTE — Progress Notes (Signed)
eLink Physician-Brief Progress Note Patient Name: Troy Gregory DOB: March 11, 1936 MRN: [id number]  Date of Service  06/30/2011   HPI/Events of Note  Called by nurse ,patient bordeline hypotensive with mean MAP 60, received some fluids bolus throughout the day, also 125 cc /h    eICU Interventions  Will give 1L [place] bolus       Camillo Quadros S. 06/30/2011, 8:23 PM

## 2011-07-01 DIAGNOSIS — N390 Urinary tract infection, site not specified: Secondary | ICD-10-CM | POA: Diagnosis present

## 2011-07-01 DIAGNOSIS — I2589 Other forms of chronic ischemic heart disease: Secondary | ICD-10-CM

## 2011-07-01 DIAGNOSIS — R7881 Bacteremia: Secondary | ICD-10-CM | POA: Diagnosis present

## 2011-07-01 LAB — CBC
HCT: 26.6 % — ABNORMAL LOW (ref 39.0–52.0)
Hemoglobin: 8.5 g/dL — ABNORMAL LOW (ref 13.0–17.0)
MCH: 29 pg (ref 26.0–34.0)
MCHC: 32 g/dL (ref 30.0–36.0)
MCV: 90.8 fL (ref 78.0–100.0)
Platelets: 97 10*3/uL — ABNORMAL LOW (ref 150–400)
RBC: 2.93 MIL/uL — ABNORMAL LOW (ref 4.22–5.81)
RDW: 14.2 % (ref 11.5–15.5)
WBC: 14.1 10*3/uL — ABNORMAL HIGH (ref 4.0–10.5)

## 2011-07-01 LAB — COMPREHENSIVE METABOLIC PANEL
ALT: 33 U/L (ref 0–53)
AST: 26 U/L (ref 0–37)
Albumin: 2.1 g/dL — ABNORMAL LOW (ref 3.5–5.2)
Alkaline Phosphatase: 82 U/L (ref 39–117)
BUN: 21 mg/dL (ref 6–23)
CO2: 23 mEq/L (ref 19–32)
Calcium: 8.2 mg/dL — ABNORMAL LOW (ref 8.4–10.5)
Chloride: 105 mEq/L (ref 96–112)
Creatinine, Ser: 1.53 mg/dL — ABNORMAL HIGH (ref 0.50–1.35)
GFR calc Af Amer: 50 mL/min — ABNORMAL LOW (ref >90)
GFR calc non Af Amer: 43 mL/min — ABNORMAL LOW (ref >90)
Glucose, Bld: 91 mg/dL (ref 70–99)
Potassium: 3.6 mEq/L (ref 3.5–5.1)
Sodium: 134 mEq/L — ABNORMAL LOW (ref 135–145)
Total Bilirubin: 0.4 mg/dL (ref 0.3–1.2)
Total Protein: 5.5 g/dL — ABNORMAL LOW (ref 6.0–8.3)

## 2011-07-01 LAB — URINE CULTURE: Special Requests: NORMAL

## 2011-07-01 LAB — PROTIME-INR: INR: 1.76 — ABNORMAL HIGH (ref 0.00–1.49)

## 2011-07-01 LAB — GLUCOSE, CAPILLARY: Glucose-Capillary: 86 mg/dL (ref 70–99)

## 2011-07-01 MED ORDER — WARFARIN SODIUM 3 MG PO TABS
3.0000 mg | ORAL_TABLET | Freq: Once | ORAL | Status: AC
  Administered 2011-07-01: 3 mg via ORAL
  Filled 2011-07-01: qty 1

## 2011-07-01 NOTE — ED Provider Notes (Signed)
Medical screening examination/treatment/procedure(s) were performed by non-physician practitioner and as supervising physician I was immediately available for consultation/collaboration.   Asriel Westrup L Nikayla Madaris, MD 07/01/11 1544 

## 2011-07-01 NOTE — Progress Notes (Signed)
Subjective: Feeling well this morning.  Much more alert and conversant.   Objective: Vital signs in last 24 hours: Temp:  [97.4 F (36.3 C)-98.3 F (36.8 C)] 98.2 F (36.8 C) (01/27 0400) Pulse Rate:  [58-70] 70  (01/27 0400) Resp:  [10-18] 16  (01/27 0400) BP: (79-125)/(44-65) 125/65 mmHg (01/27 0400) SpO2:  [97 %-100 %] 98 % (01/27 0400) Weight:  [76.8 kg (169 lb 5 oz)] 76.8 kg (169 lb 5 oz) (01/27 0500) Weight change: 4.9 kg (10 lb 12.8 oz) Last BM Date: 06/28/11  Intake/Output from previous day: 01/26 0701 - 01/27 0700 In: 4360 [P.O.:185; I.V.:2500; IV Piggyback:325] Out: 425 [Urine:425] Intake/Output this shift:    General appearance: alert, cooperative and pale Resp: diminished breath sounds base - left Cardio: regular rate and rhythm, S1, S2 normal, no murmur, click, rub or gallop GI: soft, non-tender; bowel sounds normal; no masses,  no organomegaly Extremities: extremities normal, atraumatic, no cyanosis or edema Pulses: 2+ and symmetric  Lab Results:  Basename 07/01/11 0330 06/30/11 0250  WBC 14.1* 15.9*  HGB 8.5* 9.4*  HCT 26.6* 29.2*  PLT 97* 123*   BMET  Basename 07/01/11 0330 06/30/11 0250  NA 134* 132*  K 3.6 4.0  CL 105 98  CO2 23 25  GLUCOSE 91 139*  BUN 21 23  CREATININE 1.53* 1.85*  CALCIUM 8.2* 8.4    Studies/Results: Dg Chest Port 1 View  06/29/2011  *RADIOLOGY REPORT*  Clinical Data: Altered mental status.  Weakness.  Atrial fibrillation.  Coronary artery disease peri  PORTABLE CHEST - 1 VIEW  Comparison: 06/19/2011  Findings: Low lung volumes are seen.  Patient is partially rotated. Cardiomegaly stable.  Both lungs are clear.  The patient has undergone previous CABG and mitral valve replacement.  IMPRESSION: Stable cardiomegaly and low lung volumes.  No acute findings.  Original Report Authenticated By: Danae Orleans, M.D.    Medications:  I have reviewed the patient's current medications. Scheduled:   . sodium chloride   Intravenous  Once  . aspirin EC  81 mg Oral Daily  . digoxin  0.125 mg Oral Daily  . docusate sodium  100 mg Oral BID  . dutasteride  0.5 mg Oral Daily  . piperacillin-tazobactam (ZOSYN)  IV  3.375 g Intravenous Q8H  . senna  1 tablet Oral BID  . simvastatin  20 mg Oral q1800  . sodium chloride  1,000 mL Intravenous Once  . vancomycin  1,000 mg Intravenous QHS  . warfarin  5 mg Oral Once   Continuous:   . sodium chloride 125 mL/hr (06/30/11 1528)   ZOX:[id number], acetaminophen, ondansetron (ZOFRAN) IV, ondansetron  Assessment/Plan: 1)  *SIRS (systemic inflammatory response syndrome): Improving with antibiotics. Blood culture and urine cultures showing gram - rods.  Will stop vancomycin today. Awaiting further culture date to target antibiotics.  Blood pressure stable with fluids.  WBC's decreasing. No further fever. 2) UTI: continue zosyn.  Appreciate urology input regarding indwelling catheter.  Would be best from infection standpoint if this could be removed. 3) hematuria: Urology following.  Foley currently in place, flushing well, no hematuria this morning. 4) anemia: urologic blood loss and dilution.  No other site of bleeding, bruising, pain.  Continue to monitor. Anemia panel with next draw. 5) Atrial fibrillation: NSR this am.  Cardiology following due to recent surgery. Continue coumadin, pharmacy to titrate. 6) ischemic cardiomyopathy, EF <50% : ECHO repeated yesterday, EF is 35-45%.  Will continue with fluids for blood pressure support, no  sign of volume overload at this time. 7) Acute on Chronic kidney disease: improving with fluids. 8) Dispo: will remain in SDU today until blood pressure more stable.  Likely transfer to tele tomorrow.      LOS: 2 days   WALSH,CATHERINE 07/01/2011, 7:50 AM

## 2011-07-01 NOTE — Progress Notes (Signed)
Patient ID: Troy Gregory, male   DOB: Jan 25, 1936, 76 y.o.   MRN: [id number] @ Subjective:  Pleuritic chest pain Objective:  Filed Vitals:   07/01/11 0000 07/01/11 0200 07/01/11 0400 07/01/11 0500  BP: 106/58 114/60 125/65   Pulse: 67 67 70   Temp: 98.3 F (36.8 C)  98.2 F (36.8 C)   TempSrc: Oral  Oral   Resp: 16 13 16    Height:      Weight:    76.8 kg (169 lb 5 oz)  SpO2: 98% 97% 98%     Intake/Output from previous day:  Intake/Output Summary (Last 24 hours) at 07/01/11 0743 Last data filed at 07/01/11 0500  Gross per 24 hour  Intake   4360 ml  Output    425 ml  Net   3935 ml    Physical Exam: Pale chronically ill S1/S2 no murmur Decrease BS bases BS positive soft non tender Plus 2 PT No edema  Lab Results: Basic Metabolic Panel:  Basename 07/01/11 0330 06/30/11 0250  NA 134* 132*  K 3.6 4.0  CL 105 98  CO2 23 25  GLUCOSE 91 139*  BUN 21 23  CREATININE 1.53* 1.85*  CALCIUM 8.2* 8.4  MG -- --  PHOS -- --   Liver Function Tests:  Basename 07/01/11 0330 06/30/11 0250  AST 26 36  ALT 33 42  ALKPHOS 82 95  BILITOT 0.4 0.6  PROT 5.5* 5.8*  ALBUMIN 2.1* 2.4*   No results found for this basename: LIPASE:2,AMYLASE:2 in the last 72 hours CBC:  Basename 07/01/11 0330 06/30/11 0250 06/29/11 2021  WBC 14.1* 15.9* --  NEUTROABS -- -- 4.9  HGB 8.5* 9.4* --  HCT 26.6* 29.2* --  MCV 90.8 91.0 --  PLT 97* 123* --   Cardiac Enzymes:  Basename 06/30/11 0250  CKTOTAL --  CKMB --  CKMBINDEX --  TROPONINI <0.30    Imaging: Dg Chest Port 1 View  06/29/2011  *RADIOLOGY REPORT*  Clinical Data: Altered mental status.  Weakness.  Atrial fibrillation.  Coronary artery disease peri  PORTABLE CHEST - 1 VIEW  Comparison: 06/19/2011  Findings: Low lung volumes are seen.  Patient is partially rotated. Cardiomegaly stable.  Both lungs are clear.  The patient has undergone previous CABG and mitral valve replacement.  IMPRESSION: Stable cardiomegaly and low lung  volumes.  No acute findings.  Original Report Authenticated By: Troy Gregory, M.D.    Cardiac Studies:  ECG:  SR PVC no acute changes   Telemetry:  NSR PVC  Echo: EF 35-40% MV repair is fine no SBE  Medications:     . sodium chloride   Intravenous Once  . aspirin EC  81 mg Oral Daily  . digoxin  0.125 mg Oral Daily  . docusate sodium  100 mg Oral BID  . dutasteride  0.5 mg Oral Daily  . piperacillin-tazobactam (ZOSYN)  IV  3.375 g Intravenous Q8H  . senna  1 tablet Oral BID  . simvastatin  20 mg Oral q1800  . sodium chloride  1,000 mL Intravenous Once  . vancomycin  1,000 mg Intravenous QHS  . warfarin  5 mg Oral Once       . sodium chloride 125 mL/hr (06/30/11 1528)    Assessment/Plan:  PAF:  Maint NSR MR:  S/P MV repair normal by echo with no SBE CAD:  Pain atypical troponin negative recent CABG observe  Troy Gregory 07/01/2011, 7:43 AM

## 2011-07-01 NOTE — Progress Notes (Signed)
ANTICOAGULATION CONSULT NOTE - Follow Up Consult  Pharmacy Consult for Coumadin Indication: atrial fibrillation, mitral valve ring  No Known Allergies  Patient Measurements: Height: 6\' 1"  (185.4 cm) Weight: 169 lb 5 oz (76.8 kg) IBW/kg (Calculated) : 79.9    Vital Signs: Temp: 98.8 F (37.1 C) (01/27 1200) Temp src: Oral (01/27 1200) BP: 122/69 mmHg (01/27 1000) Pulse Rate: 68  (01/27 1000)  Labs:  Basename 07/01/11 0330 06/30/11 0250 06/29/11 2037 06/29/11 2021  HGB 8.5* 9.4* -- --  HCT 26.6* 29.2* 33.0* --  PLT 97* 123* -- 137*  APTT -- -- -- --  LABPROT 20.8* 18.1* -- 18.1*  INR 1.76* 1.47 -- 1.47  HEPARINUNFRC -- -- -- --  CREATININE 1.53* 1.85* 1.70* --  CKTOTAL -- -- -- --  CKMB -- -- -- --  TROPONINI -- <0.30 -- --   Estimated Creatinine Clearance: 46.7 ml/min (by C-G formula based on Cr of 1.53).   Medications:  Scheduled:    . aspirin EC  81 mg Oral Daily  . digoxin  0.125 mg Oral Daily  . docusate sodium  100 mg Oral BID  . dutasteride  0.5 mg Oral Daily  . piperacillin-tazobactam (ZOSYN)  IV  3.375 g Intravenous Q8H  . senna  1 tablet Oral BID  . simvastatin  20 mg Oral q1800  . sodium chloride  1,000 mL Intravenous Once  . DISCONTD: vancomycin  1,000 mg Intravenous QHS    Assessment:  76 yo male on chronic coumadin for afib  Home dose reported as 3mg  daily  INR subtherapeutic but responding after boosted dose 1/26  Recent hematuria reported from Foley, none reported today  H/H, Pltc falling slightly, anemia panel pending   Goal of Therapy:  INR 2-3   Plan:   Coumadin 3mg  po x1 today  Daily PT/INR  Loralee Pacas, PharmD, BCPS Pager: [phone] 07/01/2011,3:16 PM

## 2011-07-02 ENCOUNTER — Encounter (HOSPITAL_COMMUNITY): Payer: Self-pay

## 2011-07-02 ENCOUNTER — Encounter: Payer: Medicare Other | Admitting: Nurse Practitioner

## 2011-07-02 LAB — BASIC METABOLIC PANEL
CO2: 25 mEq/L (ref 19–32)
Chloride: 105 mEq/L (ref 96–112)
Glucose, Bld: 86 mg/dL (ref 70–99)
Potassium: 3.7 mEq/L (ref 3.5–5.1)
Sodium: 136 mEq/L (ref 135–145)

## 2011-07-02 LAB — FOLATE: Folate: 2.7 ng/mL — ABNORMAL LOW

## 2011-07-02 LAB — IRON AND TIBC
Iron: 51 ug/dL (ref 42–135)
Saturation Ratios: 35 % (ref 20–55)
UIBC: 96 ug/dL — ABNORMAL LOW (ref 125–400)

## 2011-07-02 LAB — CULTURE, BLOOD (ROUTINE X 2): Culture  Setup Time: 201301260135

## 2011-07-02 LAB — PROTIME-INR
INR: 1.63 — ABNORMAL HIGH (ref 0.00–1.49)
Prothrombin Time: 19.6 seconds — ABNORMAL HIGH (ref 11.6–15.2)

## 2011-07-02 LAB — CBC
Hemoglobin: 9.1 g/dL — ABNORMAL LOW (ref 13.0–17.0)
Platelets: 107 10*3/uL — ABNORMAL LOW (ref 150–400)
RBC: 3.1 MIL/uL — ABNORMAL LOW (ref 4.22–5.81)
WBC: 12.8 10*3/uL — ABNORMAL HIGH (ref 4.0–10.5)

## 2011-07-02 LAB — RETICULOCYTES: RBC.: 3.1 MIL/uL — ABNORMAL LOW (ref 4.22–5.81)

## 2011-07-02 LAB — GLUCOSE, CAPILLARY

## 2011-07-02 LAB — VITAMIN B12: Vitamin B-12: 580 pg/mL (ref 211–911)

## 2011-07-02 MED ORDER — WARFARIN SODIUM 5 MG PO TABS
5.0000 mg | ORAL_TABLET | Freq: Once | ORAL | Status: AC
  Administered 2011-07-02: 5 mg via ORAL
  Filled 2011-07-02: qty 1

## 2011-07-02 NOTE — Progress Notes (Signed)
Subjective: Continues to feel better but is confused. Patient had a bowel movement this morning in bed and unfortunately soiled himself all over he denies shortness of breath or chest pain. Renal function, hemoglobin and white count all are improving  Objective: Weight change: -2.3 kg (-5 lb 1.1 oz)  Intake/Output Summary (Last 24 hours) at 07/02/11 0803 Last data filed at 07/02/11 0400  Gross per 24 hour  Intake 1537.5 ml  Output   2575 ml  Net -1037.5 ml    Filed Vitals:   07/02/11 0025 07/02/11 0400 07/02/11 0433 07/02/11 0500  BP: 159/82  154/84   Pulse: 42  64   Temp:  98.1 F (36.7 C)    TempSrc:  Oral    Resp: 15  17   Height:      Weight:    74.5 kg (164 lb 3.9 oz)  SpO2: 92%  99%     General appearance: alert, cooperative and pale  Resp: diminished breath sounds base - left  Cardio: regular rate and rhythm, S1, S2 normal, no murmur, click, rub or gallop  GI: soft, non-tender; bowel sounds normal; no masses, no organomegaly  Extremities: extremities normal, atraumatic, no cyanosis or edema  Pulses: 2+ and symmetric   Lab Results:  Basename 07/02/11 0305 07/01/11 0330  NA 136 134*  K 3.7 3.6  CL 105 105  CO2 25 23  GLUCOSE 86 91  BUN 16 21  CREATININE 1.42* 1.53*  CALCIUM 8.7 8.2*  MG -- --  PHOS -- --    Basename 07/01/11 0330 06/30/11 0250  AST 26 36  ALT 33 42  ALKPHOS 82 95  BILITOT 0.4 0.6  PROT 5.5* 5.8*  ALBUMIN 2.1* 2.4*   No results found for this basename: LIPASE:2,AMYLASE:2 in the last 72 hours  Basename 07/02/11 0305 07/01/11 0330 06/29/11 2021  WBC 12.8* 14.1* --  NEUTROABS -- -- 4.9  HGB 9.1* 8.5* --  HCT 28.0* 26.6* --  MCV 90.3 90.8 --  PLT 107* 97* --    Basename 06/30/11 0250  CKTOTAL --  CKMB --  CKMBINDEX --  TROPONINI <0.30   No components found with this basename: POCBNP:3 No results found for this basename: DDIMER:2 in the last 72 hours No results found for this basename: HGBA1C:2 in the last 72 hours No  results found for this basename: CHOL:2,HDL:2,LDLCALC:2,TRIG:2,CHOLHDL:2,LDLDIRECT:2 in the last 72 hours No results found for this basename: TSH,T4TOTAL,FREET3,T3FREE,THYROIDAB in the last 72 hours  Basename 07/02/11 0305  VITAMINB12 --  FOLATE --  FERRITIN --  TIBC --  IRON --  RETICCTPCT 0.8    Studies/Results: No results found. Medications: Scheduled Meds:   . aspirin EC  81 mg Oral Daily  . digoxin  0.125 mg Oral Daily  . docusate sodium  100 mg Oral BID  . dutasteride  0.5 mg Oral Daily  . piperacillin-tazobactam (ZOSYN)  IV  3.375 g Intravenous Q8H  . senna  1 tablet Oral BID  . simvastatin  20 mg Oral q1800  . warfarin  3 mg Oral ONCE-1800  . DISCONTD: vancomycin  1,000 mg Intravenous QHS   Continuous Infusions:   . DISCONTD: sodium chloride 125 mL/hr (07/01/11 1334)   PRN Meds:.acetaminophen, acetaminophen, ondansetron (ZOFRAN) IV, ondansetron  Assessment/Plan:  Assessment/Plan:  1) *SIRS (systemic inflammatory response syndrome): Saw. Blood culture and urine cultures showing gram - rods. Will likely be Klebsiella pneumonia.  Awaiting further culture date to target antibiotics. Blood pressure stable with fluids. WBC's decreasing. No further fever.  2) UTI: continue zosyn. Appreciate urology input regarding indwelling catheter. Would be best from infection standpoint if this could be removed. Patient still confused and is at risk for Foley trauma 3) hematuria: Urology following. Foley currently in place, flushing well, no hematuria this morning.  4) anemia: Hemoglobin coming up some. Likely from blood loss from Foley catheter  5) Atrial fibrillation: Continues in NSR this am. Cardiology following due to recent surgery. Continue coumadin, pharmacy to titrate.  6) ischemic cardiomyopathy, EF <50% : ECHO repeated yesterday, EF is 35-45%. Will continue with fluids for blood pressure support, no sign of volume overload at this time.  7) Acute on Chronic kidney disease:  Improved  8) Dispo: Transfer to telemetry    LOS: 3 days   Troy Gregory 07/02/2011, 8:03 AM

## 2011-07-02 NOTE — Progress Notes (Signed)
ANTICOAGULATION CONSULT NOTE - Follow Up Consult  Pharmacy Consult for Warfarin Indication: atrial fibrillation  No Known Allergies  Patient Measurements: Height: 6\' 1"  (185.4 cm) Weight: 164 lb 3.9 oz (74.5 kg) IBW/kg (Calculated) : 79.9   Vital Signs: Temp: 98.3 F (36.8 C) (01/28 0800) Temp src: Oral (01/28 0800) BP: 106/55 mmHg (01/28 0800) Pulse Rate: 64  (01/28 0433)  Labs:  Basename 07/02/11 0305 07/01/11 0330 06/30/11 0250  HGB 9.1* 8.5* --  HCT 28.0* 26.6* 29.2*  PLT 107* 97* 123*  APTT -- -- --  LABPROT 19.6* 20.8* 18.1*  INR 1.63* 1.76* 1.47  HEPARINUNFRC -- -- --  CREATININE 1.42* 1.53* 1.85*  CKTOTAL -- -- --  CKMB -- -- --  TROPONINI -- -- <0.30   Estimated Creatinine Clearance: 48.8 ml/min (by C-G formula based on Cr of 1.42).   Medications:  Scheduled:    . aspirin EC  81 mg Oral Daily  . digoxin  0.125 mg Oral Daily  . dutasteride  0.5 mg Oral Daily  . piperacillin-tazobactam (ZOSYN)  IV  3.375 g Intravenous Q8H  . simvastatin  20 mg Oral q1800  . warfarin  3 mg Oral ONCE-1800  . DISCONTD: docusate sodium  100 mg Oral BID  . DISCONTD: senna  1 tablet Oral BID    Assessment: 76 yo M on chronic warfarin for Afib. Home dose reportedly 3mg  daily. No further hematuria reported. H/H, plts improved today. INR remains subtherapeutic. Will give slightly higher dose tonight.  Goal of Therapy:  INR 2-3   Plan:  1) Warfarin 5mg  PO x1 @ 18:00 2) Daily PT/INR  Annia Belt 07/02/2011,9:55 AM

## 2011-07-02 NOTE — Progress Notes (Signed)
Urology Progress Note  Subjective:     No acute urologic events. Urine is clear in catheter.  Objective:  Patient Vitals for the past 24 hrs:  BP Temp Temp src Pulse Resp SpO2 Weight  07/02/11 1200 - 97.3 F (36.3 C) Oral 58  15  99 % -  07/02/11 1110 - - - 62  - 99 % -  07/02/11 0800 106/55 mmHg 98.3 F (36.8 C) Oral - 16  96 % -  07/02/11 0500 - - - - - - 74.5 kg (164 lb 3.9 oz)  07/02/11 0433 154/84 mmHg - - 64  17  99 % -  07/02/11 0400 - 98.1 F (36.7 C) Oral - - - -  07/02/11 0025 159/82 mmHg - - 42  15  92 % -  07/02/11 0000 - 98 F (36.7 C) Oral - - - -  07/01/11 2030 132/81 mmHg - - 66  17  98 % -  07/01/11 2000 - 97.5 F (36.4 C) Oral - - - -  07/01/11 1600 150/85 mmHg 97.3 F (36.3 C) Oral 67  15  100 % -    Physical Exam: General:  No acute distress, awake Abdomen:               [  ] Soft, appropriately TTP  [ x ] Soft, NTTP  [  ] Soft, appropriately TTP, incision(s) clean/dry/intact  Genitourinary:  Foley catheter in place Foley:  Clear yellow urine    I/O last 3 completed shifts: In: 4022.5 [P.O.:910; I.V.:2375; Other:375; IV Piggyback:362.5] Out: 3100 [Urine:3100]     Assessment: Urosepsis. Catheter trauma. Urinary retention. CHF. Plan: -I had a long discussion with the patient's wife today.  We discussed management options for his urinary retention. He remains on finasteride and tamsulosin.  We discussed possible causes of urinary retention: bladder dysfunction vs obstruction vs both.  Other confounding factors include delirium and dementia. In the face of dementia, I would not recommend a TURP; she states he has had forgetfulness prior to his illness, but she does not think he is demented. Sometimes these are transient and sometimes permanent. Options for management include continued foley catheter, placement of suprapubic catheter, or transurethral resection of prostate.  The patient has traumatically removed his foley catheter.  He would also be  able to traumatically remove an suprapubic (SP) tube.  SP tube requires a surgery to place; if it is removed, it must be replaced within 1-2 hours or the tract will close off.  At this point I feel, as does his wife, that the risk of placing a SP tube outweighs the benefit at this point.  I do not recommend a TURP at this time as it is certainly a larger risk than SP tube, and I have no indication that his problem is solely due to prostate obstruction.  This will take some further investigation.  To help prevent him from removing the catheter himself, I have advised the foley and catheter bag be secured to his leg with an ACE bandage to prevent him from manipulating it.  He has an appointment to see me 07/20/11 at which time we will try a voiding trial.  If this is unsuccessful, we can further look for causes of his retention.  This will also allow any temporary causes of retention more time to resolve of their own accord.  His wife is in agreement with this plan as she wishes to avoid surgery.   Natalia Leatherwood, MD [phone]

## 2011-07-02 NOTE — Evaluation (Signed)
Physical Therapy Evaluation Patient Details Name: Troy Gregory MRN: [id number] DOB: 22-Mar-1936 Today's Date: 07/02/2011 Ev 2 Problem List:  Patient Active Problem List  Diagnoses  . Atrial fibrillation  . Syncope  . Chronic kidney disease  . Hypertension  . Dyspnea  . Cardiomyopathy, ischemic  . SIRS (systemic inflammatory response syndrome)  . Hematuria  . Thrombocytopenia  . Mitral regurgitation  . Coronary artery disease  . Ejection fraction < 50%  . Bacteremia due to Gram-negative bacteria  . UTI (lower urinary tract infection)    Past Medical History:  Past Medical History  Diagnosis Date  . Hypertension   . Atrial fibrillation 06/2011    S/p left sided MAZE. On Coumadin, January, 2013  . Syncope     Near-syncope, etiology is deemed secondary to the excessive heat  . Benign prostatic hypertrophy     but stable  . Chronic anticoagulation   . Coronary artery disease 06/2011    S/p LIMA-LAD, SVG-intermediate coronary (?), and SVG-distal RCA with concurrent MV repair and MAZE  . Mitral regurgitation 06/2011    Mitral valve ring annuloplasty at the time of CABG,, January, 2013  . Shortness of breath   . Chronic kidney disease 12/2010    with a creatinine on discharge of 1.84, was  2.1 on admission  . Calculus of kidney   . H/O hiatal hernia   . Ejection fraction < 50%     EF previously 50%, however 20-25% by echo and catheterization before CABG  . Chronic kidney disease    Past Surgical History:  Past Surgical History  Procedure Date  . Circumcision 08/12/2002    because of phimosis  . Maze 06/06/2011    Procedure: MAZE;  Surgeon: [name], MD;  [facility];  Service: Open Heart Surgery;  Laterality: N/A;  . Mitral valve repair 06/06/2011    Procedure: MITRAL VALVE REPAIR (MVR);  Surgeon: [name], MD;  Location: [facility] OR;  Service: Open Heart Surgery;  Laterality: N/A;  . Coronary artery bypass graft 06/06/2011     grafts times three using left  internal mammary artery and right leg greater saphenous vein harvested endoscopically  . Saturation biopsy of prostate     PT Assessment/Plan/Recommendation PT Assessment Clinical Impression Statement: pt will benefit from PT to maximize independence for next venue of care PT Recommendation/Assessment: Patient will need skilled PT in the acute care venue PT Problem List: Decreased range of motion;Decreased activity tolerance;Decreased balance;Decreased mobility;Decreased safety awareness;Decreased knowledge of use of DME Barriers to Discharge Comments: unsure how much assist wife can provide PT Therapy Diagnosis : Difficulty walking PT Plan PT Frequency: Min 3X/week PT Treatment/Interventions: DME instruction;Gait training;Functional mobility training;Therapeutic activities;Therapeutic exercise;Patient/family education;Stair training (stairs if home) PT Recommendation Follow Up Recommendations: Skilled nursing facility;Home health PT (depending on wife's ability to assist) Equipment Recommended: None recommended by PT PT Goals  Acute Rehab PT Goals PT Goal Formulation: With patient Time For Goal Achievement: 2 weeks Pt will go Supine/Side to Sit: with supervision PT Goal: Supine/Side to Sit - Progress: Goal set today Pt will go Sit to Supine/Side: with supervision PT Goal: Sit to Supine/Side - Progress: Goal set today Pt will go Sit to Stand: with supervision PT Goal: Sit to Stand - Progress: Goal set today Pt will go Stand to Sit: with supervision PT Goal: Stand to Sit - Progress: Goal set today Pt will Ambulate: >150 feet;with supervision;with least restrictive assistive device PT Goal: Ambulate - Progress: Goal set today  Pt will Go Up / Down Stairs: 1-2 stairs;with min assist;with least restrictive assistive device (address if home) PT Goal: Up/Down Stairs - Progress: Goal set today  PT Evaluation Precautions/Restrictions  Precautions Precautions: Fall Prior Functioning    Home Living Lives With: Spouse Type of Home: House Home Layout: One level Home Access: Stairs to enter Entrance Stairs-Rails: None Entrance Stairs-Number of Steps: 1 Bathroom Shower/Tub: Health visitor: Standard Prior Function Level of Independence: Independent with transfers;Independent with gait Driving: Yes Cognition Cognition Arousal/Alertness: Awake/alert Overall Cognitive Status: Appears within functional limits for tasks assessed Attention: Appears overall intact for tasks assessed Orientation Level: Oriented to person;Oriented to place Following Commands: Follows one step commands consistently Safety/Judgement: Decreased awareness of safety precautions Sensation/Coordination   Extremity Assessment RUE Strength RUE Overall Strength: Within Functional Limits for tasks performed LUE Strength LUE Overall Strength: Within Functional Limits for tasks assessed RLE Strength RLE Overall Strength: Within Functional Limits for tasks assessed LLE Strength LLE Overall Strength: Within Functional Limits for tasks assessed Mobility (including Balance) Bed Mobility Sitting - Scoot to Edge of Bed Details (indicate cue type and reason): OOB to [facility] with RN Transfers Sit to Stand: 4: Min assist;From chair/3-in-1 Sit to Stand Details (indicate cue type and reason): cues for hand placement and safety Stand to Sit: 4: Min assist;To chair/3-in-1 Stand to Sit Details: cues for hand placement and safety  Ambulation/Gait Ambulation/Gait Assistance: 4: Min assist Ambulation/Gait Assistance Details (indicate cue type and reason): cues for posture and RW safety and direction to return to room Ambulation Distance (Feet): 350 Feet Assistive device: 1 person hand held assist;None Gait Pattern: Trunk flexed    Exercise    End of Session PT - End of Session Equipment Utilized During Treatment: Gait belt Activity Tolerance: Patient tolerated treatment well Patient left: in  chair;with call bell in reach Nurse Communication: Mobility status for transfers;Mobility status for ambulation General Behavior During Session: [facility] for tasks performed  [facility] 07/02/2011, 1:23 PM

## 2011-07-02 NOTE — Progress Notes (Signed)
Subjective:  Continues to feel better. Few 'minor' chest pains, no dyspnea. No other complaints.  Objective:  Vital Signs in the last 24 hours: Temp:  [97.3 F (36.3 C)-98.8 F (37.1 C)] 98.1 F (36.7 C) (01/28 0400) Pulse Rate:  [42-68] 64  (01/28 0433) Resp:  [15-17] 17  (01/28 0433) BP: (122-159)/(69-85) 154/84 mmHg (01/28 0433) SpO2:  [92 %-100 %] 99 % (01/28 0433)  Intake/Output from previous day: 01/27 0701 - 01/28 0700 In: 1962.5 [P.O.:850; I.V.:1000; IV Piggyback:112.5] Out: 3100 [Urine:3100]  Physical Exam: Pt is alert and oriented, NAD HEENT: normal Neck: JVP - normal Lungs: CTA bilaterally CV: RRR without murmur or gallop. No friction rub. Abd: soft, NT, Positive BS, no hepatomegaly Ext: no C/C/E, distal pulses intact and equal Skin: warm/dry no rash   Lab Results:  Basename 07/02/11 0305 07/01/11 0330  WBC 12.8* 14.1*  HGB 9.1* 8.5*  PLT 107* 97*    Basename 07/02/11 0305 07/01/11 0330  NA 136 134*  K 3.7 3.6  CL 105 105  CO2 25 23  GLUCOSE 86 91  BUN 16 21  CREATININE 1.42* 1.53*    Basename 06/30/11 0250  TROPONINI <0.30   Tele: Sinus rhythm, no arrhythmia.  Assessment/Plan:  1. SIRS - clinically improving 2. Atrial fibrillation - maintaining sinus rhythm following Maze procedure 3. Mixed cardiomyopathy/Mitral regurgitation - s/p CABG and MV repair. LVEF depressed but improved postoperatively. Continue supportive care. BP has been low, so will carefully add back meds for cardiomyopathy if he remains stable tomorrow. Recommend restart coumadin for atrial fibrillation.  Troy Gregory, M.D. 07/02/2011, 6:28 AM

## 2011-07-03 DIAGNOSIS — I2589 Other forms of chronic ischemic heart disease: Secondary | ICD-10-CM

## 2011-07-03 LAB — DIFFERENTIAL
Basophils Absolute: 0 10*3/uL (ref 0.0–0.1)
Eosinophils Absolute: 0.2 10*3/uL (ref 0.0–0.7)
Eosinophils Relative: 3 % (ref 0–5)
Lymphocytes Relative: 9 % — ABNORMAL LOW (ref 12–46)
Lymphs Abs: 0.7 10*3/uL (ref 0.7–4.0)
Neutrophils Relative %: 79 % — ABNORMAL HIGH (ref 43–77)

## 2011-07-03 LAB — CBC
MCH: 29.6 pg (ref 26.0–34.0)
MCV: 89.3 fL (ref 78.0–100.0)
Platelets: 137 10*3/uL — ABNORMAL LOW (ref 150–400)
RBC: 3.35 MIL/uL — ABNORMAL LOW (ref 4.22–5.81)
RDW: 13.8 % (ref 11.5–15.5)
WBC: 8.5 10*3/uL (ref 4.0–10.5)

## 2011-07-03 LAB — BASIC METABOLIC PANEL
Calcium: 9 mg/dL (ref 8.4–10.5)
GFR calc non Af Amer: 45 mL/min — ABNORMAL LOW (ref >90)
Potassium: 3.3 mEq/L — ABNORMAL LOW (ref 3.5–5.1)
Sodium: 139 mEq/L (ref 135–145)

## 2011-07-03 LAB — GLUCOSE, CAPILLARY: Glucose-Capillary: 91 mg/dL (ref 70–99)

## 2011-07-03 LAB — PROTIME-INR
INR: 1.79 — ABNORMAL HIGH (ref 0.00–1.49)
Prothrombin Time: 21.1 s — ABNORMAL HIGH (ref 11.6–15.2)

## 2011-07-03 MED ORDER — POTASSIUM CHLORIDE CRYS ER 20 MEQ PO TBCR
20.0000 meq | EXTENDED_RELEASE_TABLET | Freq: Two times a day (BID) | ORAL | Status: DC
  Administered 2011-07-03 – 2011-07-04 (×3): 20 meq via ORAL
  Filled 2011-07-03 (×4): qty 1

## 2011-07-03 MED ORDER — WARFARIN SODIUM 5 MG PO TABS
5.0000 mg | ORAL_TABLET | Freq: Once | ORAL | Status: AC
  Administered 2011-07-03: 5 mg via ORAL
  Filled 2011-07-03: qty 1

## 2011-07-03 MED ORDER — LEVOFLOXACIN 500 MG PO TABS
500.0000 mg | ORAL_TABLET | Freq: Every day | ORAL | Status: DC
  Administered 2011-07-03 – 2011-07-04 (×2): 500 mg via ORAL
  Filled 2011-07-03 (×2): qty 1

## 2011-07-03 MED ORDER — CARVEDILOL 6.25 MG PO TABS
6.2500 mg | ORAL_TABLET | Freq: Two times a day (BID) | ORAL | Status: DC
  Administered 2011-07-03 – 2011-07-04 (×3): 6.25 mg via ORAL
  Filled 2011-07-03 (×4): qty 1

## 2011-07-03 NOTE — Progress Notes (Signed)
CSW visited with the pt and his wife and daughter who was at the bedside. Pt reports he has been at [facility] for 10 days and he is hopeful that when he discharges tomorrow he can go home, instead of returning to SNF. Pts wife is agreeable with the pt going home as long as Dr. Kevan Ny agrees, pt and pts wife state they can have support from family members. CSW has spoken with Jill Side at [facility] and she reports the pt should be able to return tomorrow, the family opted not to hold the pts bed at the facility, but Jill Side states a male bed should be available. CSW will complete a FL-2 and place on the chart and continue to follow the pt and offer support. Patrice Paradise, LCSWA 07/03/2011 5:20 PM [phone]

## 2011-07-03 NOTE — Progress Notes (Addendum)
ANTICOAGULATION CONSULT NOTE - Follow Up Consult  Pharmacy Consult for Warfarin Indication: atrial fibrillation  No Known Allergies  Patient Measurements: Height: 6' (182.9 cm) Weight: 163 lb 3.2 oz (74.027 kg) IBW/kg (Calculated) : 77.6   Vital Signs: Temp: 97.9 F (36.6 C) (01/29 0700) Temp src: Oral (01/29 0700) BP: 154/87 mmHg (01/29 0700) Pulse Rate: 68  (01/29 0700)  Labs:  Basename 07/03/11 0405 07/02/11 0305 07/01/11 0330  HGB 9.9* 9.1* --  HCT 29.9* 28.0* 26.6*  PLT 137* 107* 97*  APTT -- -- --  LABPROT 21.1* 19.6* 20.8*  INR 1.79* 1.63* 1.76*  HEPARINUNFRC -- -- --  CREATININE 1.48* 1.42* 1.53*  CKTOTAL -- -- --  CKMB -- -- --  TROPONINI -- -- --   Estimated Creatinine Clearance: 46.5 ml/min (by C-G formula based on Cr of 1.48).   Medications:  Scheduled:     . aspirin EC  81 mg Oral Daily  . carvedilol  6.25 mg Oral BID WC  . digoxin  0.125 mg Oral Daily  . dutasteride  0.5 mg Oral Daily  . levofloxacin  500 mg Oral Daily  . potassium chloride  20 mEq Oral BID  . simvastatin  20 mg Oral q1800  . warfarin  5 mg Oral ONCE-1800  . DISCONTD: piperacillin-tazobactam (ZOSYN)  IV  3.375 g Intravenous Q8H    Assessment: 76 yo M on chronic warfarin for Afib. Home dose reportedly 3mg  daily. No further hematuria reported. H/H, plts improved today. INR remains subtherapeutic, but approaching goal.  Levaquin started today for 7 day treatment (drug-drug interaction may result in increase in INR.)  Goal of Therapy:  INR 2-3   Plan:  1) Repeat Warfarin 5mg  PO x1 today 2) Daily PT/INR  Clance Boll 07/03/2011,10:33 AM

## 2011-07-03 NOTE — Progress Notes (Addendum)
Subjective: Comfortable today. No complaints. Vital signs stable.  Objective: Weight change: 2.612 kg (5 lb 12.1 oz)  Intake/Output Summary (Last 24 hours) at 07/03/11 0728 Last data filed at 07/03/11 0700  Gross per 24 hour  Intake  697.5 ml  Output   2950 ml  Net -2252.5 ml   Filed Vitals:   07/02/11 1600 07/02/11 1942 07/02/11 2215 07/03/11 0700  BP:  116/68 170/90 154/87  Pulse:  69 64 68  Temp: 97.3 F (36.3 C) 98.3 F (36.8 C) 98.2 F (36.8 C) 97.9 F (36.6 C)  TempSrc: Oral Oral Oral Oral  Resp:  13 18 18   Height:   6' (1.829 m)   Weight:   77.111 kg (170 lb) 74.027 kg (163 lb 3.2 oz)  SpO2:  100% 100% 98%   General appearance: alert, cooperative and pale  Resp: diminished breath sounds base - left  Cardio: regular rate and rhythm, S1, S2 normal, no murmur, click, rub or gallop  GI: soft, non-tender; bowel sounds normal; no masses, no organomegaly GU: Foley catheter in place and Ace bandaged to his thigh Extremities: extremities normal, atraumatic, no cyanosis or edema  Pulses: 2+ and symmetric  Lab Results:  Basename 07/03/11 0405 07/02/11 0305  NA 139 136  K 3.3* 3.7  CL 106 105  CO2 26 25  GLUCOSE 92 86  BUN 13 16  CREATININE 1.48* 1.42*  CALCIUM 9.0 8.7  MG -- --  PHOS -- --    Basename 07/01/11 0330  AST 26  ALT 33  ALKPHOS 82  BILITOT 0.4  PROT 5.5*  ALBUMIN 2.1*   No results found for this basename: LIPASE:2,AMYLASE:2 in the last 72 hours  Basename 07/03/11 0405 07/02/11 0305  WBC 8.5 12.8*  NEUTROABS 6.7 --  HGB 9.9* 9.1*  HCT 29.9* 28.0*  MCV 89.3 90.3  PLT 137* 107*   No results found for this basename: CKTOTAL:3,CKMB:3,CKMBINDEX:3,TROPONINI:3 in the last 72 hours No components found with this basename: POCBNP:3 No results found for this basename: DDIMER:2 in the last 72 hours No results found for this basename: HGBA1C:2 in the last 72 hours No results found for this basename: CHOL:2,HDL:2,LDLCALC:2,TRIG:2,CHOLHDL:2,LDLDIRECT:2  in the last 72 hours No results found for this basename: TSH,T4TOTAL,FREET3,T3FREE,THYROIDAB in the last 72 hours  Basename 07/02/11 0305  VITAMINB12 580  FOLATE 2.7*  FERRITIN 535*  TIBC 147*  IRON 51  RETICCTPCT 0.8    Studies/Results: No results found. Medications: Scheduled Meds:   . aspirin EC  81 mg Oral Daily  . digoxin  0.125 mg Oral Daily  . dutasteride  0.5 mg Oral Daily  . piperacillin-tazobactam (ZOSYN)  IV  3.375 g Intravenous Q8H  . simvastatin  20 mg Oral q1800  . warfarin  5 mg Oral ONCE-1800  . DISCONTD: docusate sodium  100 mg Oral BID  . DISCONTD: senna  1 tablet Oral BID   Continuous Infusions:  PRN Meds:.acetaminophen, acetaminophen, DISCONTD: ondansetron (ZOFRAN) IV, DISCONTD: ondansetron  Assessment/Plan:  1) *SIRS (systemic inflammatory response syndrome): Resolved 2) UTI: Discontinue Zosyn and start Levaquin orally. Appreciate urology input regarding indwelling catheter. To followup with Dr. Evelena Peat on February 15 3) Hematuria: Urology following. Foley currently in place, flushing well, no hematuria this morning. Urine is clear. 4) Anemia: Hemoglobin is continuing to come up. Likely from blood loss from Foley catheter  5) Atrial fibrillation: Continues in NSR this am. Cardiology following due to recent surgery. Continue coumadin, pharmacy to titrate.  6) ischemic cardiomyopathy, EF <50% : ECHO  repeated yesterday, EF is 35-45%.  7) Acute on Chronic kidney disease: Improved  8) Dispo: Plans for discharge to skilled nursing facility in a.m.    LOS: 4 days   Rockell Faulks NEVILL 07/03/2011, 7:28 AM

## 2011-07-03 NOTE — Progress Notes (Signed)
Subjective:  The patient is without complaints this morning. He specifically denies chest pain or dyspnea.  Objective:  Vital Signs in the last 24 hours: Temp:  [97.3 F (36.3 C)-98.3 F (36.8 C)] 97.9 F (36.6 C) (01/29 0700) Pulse Rate:  [58-69] 68  (01/29 0700) Resp:  [13-18] 18  (01/29 0700) BP: (116-170)/(68-90) 154/87 mmHg (01/29 0700) SpO2:  [98 %-100 %] 98 % (01/29 0700) Weight:  [74.027 kg (163 lb 3.2 oz)-77.111 kg (170 lb)] 74.027 kg (163 lb 3.2 oz) (01/29 0700)  Intake/Output from previous day: 01/28 0701 - 01/29 0700 In: 697.5 [P.O.:560; IV Piggyback:137.5] Out: 2950 [Urine:2950]  Physical Exam: Pt is alert and oriented, NAD HEENT: normal Neck: JVP - normal, carotids 2+= without bruits Lungs: CTA bilaterally CV: RRR without murmur or gallop Abd: soft, NT, Positive BS, no hepatomegaly Ext: no C/C/E, distal pulses intact and equal Skin: warm/dry no rash   Lab Results:  Basename 07/03/11 0405 07/02/11 0305  WBC 8.5 12.8*  HGB 9.9* 9.1*  PLT 137* 107*    Basename 07/03/11 0405 07/02/11 0305  NA 139 136  K 3.3* 3.7  CL 106 105  CO2 26 25  GLUCOSE 92 86  BUN 13 16  CREATININE 1.48* 1.42*   No results found for this basename: TROPONINI:2,CK,MB:2 in the last 72 hours  Tele: normal sinus rhythm without significant arrhythmia  Assessment/Plan:  1. Ischemic cardiomyopathy with mitral regurgitation, status post CABG and mitral valve repair. 2. Atrial fibrillation, maintaining sinus rhythm following Maze procedure. 3. Sepsis, improved on antibiotics. Cultures grew Klebsiella, treatment as per Dr. Kevan Ny.  The patient has stabilized from a cardiovascular perspective. His blood pressure has improved and is now in the normal to high range. He should be reinitiated on therapy for his cardiomyopathy I would recommend starting slowly with initiation of carvedilol.   Tonny Bollman, M.D. 07/03/2011, 8:18 AM

## 2011-07-04 LAB — BASIC METABOLIC PANEL
Chloride: 105 mEq/L (ref 96–112)
Creatinine, Ser: 1.38 mg/dL — ABNORMAL HIGH (ref 0.50–1.35)
GFR calc Af Amer: 57 mL/min — ABNORMAL LOW (ref >90)
Potassium: 3.7 mEq/L (ref 3.5–5.1)

## 2011-07-04 LAB — CBC
HCT: 31.3 % — ABNORMAL LOW (ref 39.0–52.0)
Hemoglobin: 10.2 g/dL — ABNORMAL LOW (ref 13.0–17.0)
RDW: 13.7 % (ref 11.5–15.5)
WBC: 7.3 10*3/uL (ref 4.0–10.5)

## 2011-07-04 LAB — DIFFERENTIAL
Basophils Absolute: 0.1 10*3/uL (ref 0.0–0.1)
Lymphocytes Relative: 15 % (ref 12–46)
Monocytes Absolute: 0.9 10*3/uL (ref 0.1–1.0)
Neutro Abs: 4.7 10*3/uL (ref 1.7–7.7)
Neutrophils Relative %: 65 % (ref 43–77)

## 2011-07-04 LAB — PROTIME-INR
INR: 2.04 — ABNORMAL HIGH (ref 0.00–1.49)
Prothrombin Time: 23.4 seconds — ABNORMAL HIGH (ref 11.6–15.2)

## 2011-07-04 MED ORDER — VALACYCLOVIR HCL 1 G PO TABS: 1000.0000 mg | ORAL_TABLET | Freq: Two times a day (BID) | ORAL | Status: DC

## 2011-07-04 MED ORDER — FUROSEMIDE 40 MG PO TABS: 20.0000 mg | ORAL_TABLET | Freq: Every day | ORAL | Status: DC

## 2011-07-04 MED ORDER — LEVOFLOXACIN 500 MG PO TABS: 500.0000 mg | ORAL_TABLET | Freq: Every day | ORAL | Status: AC

## 2011-07-04 NOTE — Discharge Summary (Signed)
Physician Discharge Summary  NAME:Troy Gregory  ZOX:[id number]  DOB: [birth year]   Admit date: 06/29/2011 Discharge date: 07/04/2011  Discharge Diagnoses:  Principal Problem: Bacteremia due to Gram-negative bacteria - Klebsiella pneumonia, sensitive to Keflex Active Problems:  Atrial fibrillation - currently in sinus rhythm but continues anticoagulation therapy  Chronic kidney disease - stable  SIRS (systemic inflammatory response syndrome) - resolved  Hematuria - gross hematuria has resolved and hemoglobin is stable and improving  Thrombocytopenia - stable  Mitral regurgitation  Ejection fraction < 50%  UTI (lower urinary tract infection) - resolving   Discharge Condition: Much improved  Hospital Course: [name] is a very pleasant 76 year old male with mild to moderate memory loss who presented on 06/30/2011 with chills and gross hematuria. He was on anticoagulation therapy status post mitral valve replacement, Maze procedure for atrial fibrillation, and coronary artery bypass grafting in early 2013. Was found to have a Klebsiella urinary tract infection and remains having problems with urinary retention and decision has been made to keep Foley catheter in place with Ace wrap to right thigh that he will not pull the Foley out. He would like to return home with the assisted care his wife and not returned back to [facility] nursing home and I think a trial of that would be reasonable. He'll be discharged on Keflex to treat the Klebsiella pneumonia UTI which it is sensitive to. Vital signs are stable and he will continue Coumadin as an outpatient and we will follow him up in the office in 2 days to check PT/INR.  Filed Vitals:   07/03/11 0700 07/03/11 1424 07/03/11 2100 07/04/11 0623  BP: 154/87 168/93 168/81 137/78  Pulse: 68 58 59 63  Temp: 97.9 F (36.6 C) 97.9 F (36.6 C) 98.2 F (36.8 C) 98.3 F (36.8 C)  TempSrc: Oral Oral Oral Oral  Resp: 18 18 19 18   Height:      Weight: 74.027  kg (163 lb 3.2 oz)   72.122 kg (159 lb)  SpO2: 98% 100% 98% 97%    General appearance: alert, cooperative and pale  Resp: diminished breath sounds base - left  Cardio: regular rate and rhythm, S1, S2 normal, no murmur, click, rub or gallop  GI: soft, non-tender; bowel sounds normal; no masses, no organomegaly  GU: Foley catheter in place and Ace bandaged to his thigh  Extremities: extremities normal, atraumatic, no cyanosis or edema  Pulses: 2+ and symmetric   Consults: :  Luis Abed, MD - cardiology Milford Cage, MD - urology  Disposition: Home discharge with followup in 2 days in my office  Discharge Orders    Future Orders Please Complete By Expires   Diet - low sodium heart healthy      Increase activity slowly      Discharge instructions      Comments:   Call [phone] or [phone] for appt 07/10/2011   Call MD for:  temperature >100.4      Call MD for:  persistant nausea and vomiting      (HEART FAILURE PATIENTS) Call MD:  Anytime you have any of the following symptoms: 1) 3 pound weight gain in 24 hours or 5 pounds in 1 week 2) shortness of breath, with or without a dry hacking cough 3) swelling in the hands, feet or stomach 4) if you have to sleep on extra pillows at night in order to breathe.      Call MD for:  persistant dizziness or light-headedness  Call MD for:  hives        Medication List  As of 07/04/2011  8:02 AM   STOP taking these medications         guaiFENesin 600 MG 12 hr tablet         TAKE these medications         aspirin 81 MG EC tablet   Take 1 tablet (81 mg total) by mouth daily.      carvedilol 12.5 MG tablet   Commonly known as: COREG   Take 1 tablet (12.5 mg total) by mouth 2 (two) times daily with a meal.      digoxin 0.125 MG tablet   Commonly known as: LANOXIN   Take 1 tablet (0.125 mg total) by mouth daily.      dutasteride 0.5 MG capsule   Commonly known as: AVODART   Take 0.5 mg by mouth daily.      furosemide  40 MG tablet   Commonly known as: LASIX   Take 0.5 tablets (20 mg total) by mouth daily.      levofloxacin 500 MG tablet   Commonly known as: LEVAQUIN   Take 1 tablet (500 mg total) by mouth daily.      lisinopril 5 MG tablet   Commonly known as: PRINIVIL,ZESTRIL   Take 1 tablet (5 mg total) by mouth every morning.      potassium chloride SA 20 MEQ tablet   Commonly known as: K-DUR,KLOR-CON   Take 1 tablet (20 mEq total) by mouth daily.      simvastatin 20 MG tablet   Commonly known as: ZOCOR   Take 1 tablet (20 mg total) by mouth daily at 6 PM.      Tamsulosin HCl 0.4 MG Caps   Commonly known as: FLOMAX   Take 1 capsule (0.4 mg total) by mouth at bedtime.      traMADol 50 MG tablet   Commonly known as: ULTRAM   Take 50 mg by mouth every 6 (six) hours as needed. pain      warfarin 3 MG tablet   Commonly known as: COUMADIN   Take 3 mg by mouth daily.           Follow-up Information    Follow up with Pearla Dubonnet M.D.          Things to follow up in the outpatient setting: PT/INR, watch for further bleeding in urine, monitor for shortness of breath or chest pain or increasing confusion  Time coordinating discharge: 40 minutes  The results of significant diagnostics from this hospitalization (including imaging, microbiology, ancillary and laboratory) are listed below for reference.    Significant Diagnostic Studies: Dg Chest 2 View  06/19/2011  *RADIOLOGY REPORT*  Clinical Data: Postop CABG.  CHEST - 2 VIEW  Comparison: Multiple priors, most recently chest x-ray 06/13/2011.  Findings: Lungs are well expanded bilaterally without consolidative airspace disease.  There is blunting of both costophrenic sulci, consistent with small bilateral pleural effusions.  Bibasilar opacities are most consistent with residual areas of subsegmental atelectasis.  Pulmonary vasculature is normal.  Heart size is upper limits of normal.  Atherosclerotic calcifications are noted in the  arch of the aorta.  Postoperative changes of median sternotomy for CABG and mitral annuloplasty noted.  IMPRESSION:  1.  Overall appearance of the chest is very similar to prior study from 06/13/2011, as above, with slight interval decrease in the size of the small bilateral pleural effusions.  Original  Report Authenticated By: Florencia Reasons, M.D.   Dg Chest 2 View  06/13/2011  *RADIOLOGY REPORT*  Clinical Data: 76 year old male status post CABG.  Chest soreness.  CHEST - 2 VIEW  Comparison: 06/12/2011 and earlier.  Findings: Epicardial pacer wires remain in place.  Sequelae of CABG and cardiac valve replacement.  Small bilateral pleural effusions, greater on the left.  Mild bibasilar atelectasis.  No pneumothorax or edema.  Stable cardiac size and mediastinal contours.  Stable visualized osseous structures.  Visualized tracheal air column is within normal limits.  IMPRESSION: Small bilateral pleural effusions and atelectasis, greater on the left.  Original Report Authenticated By: Harley Hallmark, M.D.   Dg Chest 2 View  06/12/2011  *RADIOLOGY REPORT*  Clinical Data: CABG.  CHEST - 2 VIEW  Comparison: 06/09/2011  Findings: Prior median sternotomy.  Mild cardiomegaly.  Small left pleural effusion.  Improving aeration in the lungs with decreasing edema and bibasilar opacities.  Minimal left basilar opacity persists, likely atelectasis.  IMPRESSION: Near complete resolution of bilateral infiltrates with minimal residual left base atelectasis and small left effusion.  Original Report Authenticated By: Cyndie Chime, M.D.   Dg Chest 2 View  06/04/2011  *RADIOLOGY REPORT*  Clinical Data: Preop evaluation.  History of coronary artery disease.  History of mitral valvular prolapse.  For CABG and mitral valvular repair.  History of hypertension and atrial fibrillation. History of hiatal hernia.  CHEST - 2 VIEW  Comparison: 04/22/2011.  Findings: There is slight enlargement of the cardiac silhouette. Mediastinal  and hilar contours appear stable.  No pulmonary edema, pneumonia, or pleural effusion is seen.  There is osteophyte formation in the spine.  IMPRESSION: Slight enlargement of the cardiac silhouette.  No pulmonary edema, pneumonia, or other acute abnormality is seen.  Original Report Authenticated By: Crawford Givens, M.D.   Dg Chest Port 1 View  06/29/2011  *RADIOLOGY REPORT*  Clinical Data: Altered mental status.  Weakness.  Atrial fibrillation.  Coronary artery disease peri  PORTABLE CHEST - 1 VIEW  Comparison: 06/19/2011  Findings: Low lung volumes are seen.  Patient is partially rotated. Cardiomegaly stable.  Both lungs are clear.  The patient has undergone previous CABG and mitral valve replacement.  IMPRESSION: Stable cardiomegaly and low lung volumes.  No acute findings.  Original Report Authenticated By: Danae Orleans, M.D.   Dg Chest Portable 1 View In Am  06/08/2011  *RADIOLOGY REPORT*  Clinical Data: Postop heart surgery.  PORTABLE CHEST - 1 VIEW  Comparison: 06/07/2011  Findings: The Swan-Ganz catheter has been removed.  Removal of the mediastinal drain.  Left chest tube is still in place and no evidence for a pneumothorax.  Heart is enlarged with left basilar densities suggestive for atelectasis and pleural fluid. Increased densities at the right lung base may represent atelectasis or pleural fluid.  There is still a right jugular central venous catheter in place.  IMPRESSION: Support apparatuses as described.  No evidence for a large pneumothorax.  Increased basilar densities may represent atelectasis and/or pleural fluid.  Original Report Authenticated By: Richarda Overlie, M.D.   Dg Chest Portable 1 View In Am  06/07/2011  *RADIOLOGY REPORT*  Clinical Data: Postop open heart surgery  PORTABLE CHEST - 1 VIEW  Comparison: Portable chest x-ray of 06/06/2011  Findings: The endotracheal tube has been removed.  Left chest tube and Swan-Ganz catheter remain.  The lungs are not quite as well aerated with basilar  atelectasis noted.  Cardiomegaly is stable. No pneumothorax is  seen.  IMPRESSION: Endotracheal tube removed.  Persistent basilar opacities most consistent with atelectasis, left greater than right.  Original Report Authenticated By: Juline Patch, M.D.   Dg Chest Portable 1 View  06/06/2011  *RADIOLOGY REPORT*  Clinical Data: Postop CABG  PORTABLE CHEST - 1 VIEW  Comparison: 06/04/2011  Findings: There has been median sternotomy, CABG and mitral valve replacement.  Endotracheal tube has its tip 3.5 cm above the carina.  Nasogastric tube enters the stomach.  Swan-Ganz catheter has its tip in the main pulmonary artery.  Left chest tube is in place.  There is no pneumothorax.  There is basilar atelectasis left worse than right.  No pulmonary edema.  IMPRESSION: Lines and tubes well positioned.  Lower lobe atelectasis left worse than right.  No pneumothorax or edema.  Original Report Authenticated By: Thomasenia Sales, M.D.   Dg Chest Port 1v Same Day  06/09/2011  *RADIOLOGY REPORT*  Clinical Data: Follow up surgery  PORTABLE CHEST - 1 VIEW SAME DAY  Comparison: 06/08/2011  Findings: Cardiomegaly with mild interstitial edema and bilateral pleural effusions.  No pneumothorax.  Postsurgical changes related to prior CABG.  Right IJ venous sheath.  IMPRESSION: Cardiomegaly with mild interstitial edema and bilateral pleural effusions.  No pneumothorax.  Original Report Authenticated By: Charline Bills, M.D.    Microbiology: Recent Results (from the past 240 hour(s))  URINE CULTURE     Status: Normal   Collection Time   06/29/11  8:33 PM      Component Value Range Status Comment   Specimen Description URINE, CATHETERIZED   Final    Special Requests Normal   Final    Culture  Setup Time [id number]   Final    Colony Count >=100,000 COLONIES/ML   Final    Culture KLEBSIELLA PNEUMONIAE   Final    Report Status 07/01/2011 FINAL   Final    Organism ID, Bacteria KLEBSIELLA PNEUMONIAE   Final   CULTURE, BLOOD  (ROUTINE X 2)     Status: Normal   Collection Time   06/29/11  8:45 PM      Component Value Range Status Comment   Specimen Description BLOOD LEFT ARM   Final    Special Requests BOTTLES DRAWN AEROBIC AND ANAEROBIC 10CC   Final    Culture  Setup Time [id number]   Final    Culture     Final    Value: KLEBSIELLA PNEUMONIAE     Note: SUSCEPTIBILITIES PERFORMED ON PREVIOUS CULTURE WITHIN THE LAST 5 DAYS.     Note: Gram Stain Report Called to,Read Back By and Verified With: RN N. RICHARDSON ON 06/30/11 AT 1605 BY TEDAR   Report Status 07/02/2011 FINAL   Final   CULTURE, BLOOD (ROUTINE X 2)     Status: Normal   Collection Time   06/29/11  8:55 PM      Component Value Range Status Comment   Specimen Description BLOOD RIGHT HAND   Final    Special Requests BOTTLES DRAWN AEROBIC AND ANAEROBIC 10CC   Final    Culture  Setup Time [id number]   Final    Culture     Final    Value: KLEBSIELLA PNEUMONIAE     Note: Gram Stain Report Called to,Read Back By and Verified With: RN N. RICHARDSON ON 06/30/11 AT 1605 BY TEDAR   Report Status 07/02/2011 FINAL   Final    Organism ID, Bacteria KLEBSIELLA PNEUMONIAE   Final   MRSA PCR SCREENING  Status: Normal   Collection Time   06/30/11  1:34 AM      Component Value Range Status Comment   MRSA by PCR NEGATIVE  NEGATIVE  Final      Labs: Results for orders placed during the hospital encounter of 06/29/11  URINALYSIS, ROUTINE W REFLEX MICROSCOPIC      Component Value Range   Color, Urine YELLOW  YELLOW    APPearance CLOUDY (*) CLEAR    Specific Gravity, Urine 1.011  1.005 - 1.030    pH 8.0  5.0 - 8.0    Glucose, UA NEGATIVE  NEGATIVE (mg/dL)   Hgb urine dipstick LARGE (*) NEGATIVE    Bilirubin Urine NEGATIVE  NEGATIVE    Ketones, ur NEGATIVE  NEGATIVE (mg/dL)   Protein, ur >161 (*) NEGATIVE (mg/dL)   Urobilinogen, UA 1.0  0.0 - 1.0 (mg/dL)   Nitrite NEGATIVE  NEGATIVE    Leukocytes, UA LARGE (*) NEGATIVE   CBC      Component Value Range   WBC  4.9  4.0 - 10.5 (K/uL)   RBC 3.79 (*) 4.22 - 5.81 (MIL/uL)   Hemoglobin 11.2 (*) 13.0 - 17.0 (g/dL)   HCT 09.6 (*) 04.5 - 52.0 (%)   MCV 91.0  78.0 - 100.0 (fL)   MCH 29.6  26.0 - 34.0 (pg)   MCHC 32.5  30.0 - 36.0 (g/dL)   RDW 40.9  81.1 - 91.4 (%)   Platelets 137 (*) 150 - 400 (K/uL)  DIFFERENTIAL      Component Value Range   Neutrophils Relative 98 (*) 43 - 77 (%)   Neutro Abs 4.9  1.7 - 7.7 (K/uL)   Lymphocytes Relative 1 (*) 12 - 46 (%)   Lymphs Abs 0.1 (*) 0.7 - 4.0 (K/uL)   Monocytes Relative 0 (*) 3 - 12 (%)   Monocytes Absolute 0.0 (*) 0.1 - 1.0 (K/uL)   Eosinophils Relative 0  0 - 5 (%)   Eosinophils Absolute 0.0  0.0 - 0.7 (K/uL)   Basophils Relative 0  0 - 1 (%)   Basophils Absolute 0.0  0.0 - 0.1 (K/uL)  LACTIC ACID, PLASMA      Component Value Range   Lactic Acid, Venous 2.7 (*) 0.5 - 2.2 (mmol/L)  PROCALCITONIN      Component Value Range   Procalcitonin 34.66    CULTURE, BLOOD (ROUTINE X 2)      Component Value Range   Specimen Description BLOOD LEFT ARM     Special Requests BOTTLES DRAWN AEROBIC AND ANAEROBIC 10CC     Culture  Setup Time [id number]     Culture       Value: KLEBSIELLA PNEUMONIAE     Note: SUSCEPTIBILITIES PERFORMED ON PREVIOUS CULTURE WITHIN THE LAST 5 DAYS.     Note: Gram Stain Report Called to,Read Back By and Verified With: RN N. RICHARDSON ON 06/30/11 AT 1605 BY TEDAR   Report Status 07/02/2011 FINAL    CULTURE, BLOOD (ROUTINE X 2)      Component Value Range   Specimen Description BLOOD RIGHT HAND     Special Requests BOTTLES DRAWN AEROBIC AND ANAEROBIC 10CC     Culture  Setup Time [id number]     Culture       Value: KLEBSIELLA PNEUMONIAE     Note: Gram Stain Report Called to,Read Back By and Verified With: RN N. RICHARDSON ON 06/30/11 AT 1605 BY TEDAR   Report Status 07/02/2011 FINAL     Organism ID, Bacteria  KLEBSIELLA PNEUMONIAE    URINE CULTURE      Component Value Range   Specimen Description URINE, CATHETERIZED     Special  Requests Normal     Culture  Setup Time [id number]     Colony Count >=100,000 COLONIES/ML     Culture KLEBSIELLA PNEUMONIAE     Report Status 07/01/2011 FINAL     Organism ID, Bacteria KLEBSIELLA PNEUMONIAE    POCT I-STAT, CHEM 8      Component Value Range   Sodium 135  135 - 145 (mEq/L)   Potassium 4.5  3.5 - 5.1 (mEq/L)   Chloride 97  96 - 112 (mEq/L)   BUN 22  6 - 23 (mg/dL)   Creatinine, Ser 9.56 (*) 0.50 - 1.35 (mg/dL)   Glucose, Bld 213 (*) 70 - 99 (mg/dL)   Calcium, Ion 0.86  5.78 - 1.32 (mmol/L)   TCO2 29  0 - 100 (mmol/L)   Hemoglobin 11.2 (*) 13.0 - 17.0 (g/dL)   HCT 46.9 (*) 62.9 - 52.0 (%)  PROTIME-INR      Component Value Range   Prothrombin Time 18.1 (*) 11.6 - 15.2 (seconds)   INR 1.47  0.00 - 1.49   DIGOXIN LEVEL      Component Value Range   Digoxin Level 0.9  0.8 - 2.0 (ng/mL)  URINE MICROSCOPIC-ADD ON      Component Value Range   RBC / HPF TOO NUMEROUS TO COUNT  <3 (RBC/hpf)   Urine-Other FIELD OBSCURED BY RBC'S    MRSA PCR SCREENING      Component Value Range   MRSA by PCR NEGATIVE  NEGATIVE   INFLUENZA PANEL BY PCR      Component Value Range   Influenza A By PCR NEGATIVE  NEGATIVE    Influenza B By PCR NEGATIVE  NEGATIVE    H1N1 flu by pcr NOT DETECTED  NOT DETECTED   COMPREHENSIVE METABOLIC PANEL      Component Value Range   Sodium 132 (*) 135 - 145 (mEq/L)   Potassium 4.0  3.5 - 5.1 (mEq/L)   Chloride 98  96 - 112 (mEq/L)   CO2 25  19 - 32 (mEq/L)   Glucose, Bld 139 (*) 70 - 99 (mg/dL)   BUN 23  6 - 23 (mg/dL)   Creatinine, Ser 5.28 (*) 0.50 - 1.35 (mg/dL)   Calcium 8.4  8.4 - 41.3 (mg/dL)   Total Protein 5.8 (*) 6.0 - 8.3 (g/dL)   Albumin 2.4 (*) 3.5 - 5.2 (g/dL)   AST 36  0 - 37 (U/L)   ALT 42  0 - 53 (U/L)   Alkaline Phosphatase 95  39 - 117 (U/L)   Total Bilirubin 0.6  0.3 - 1.2 (mg/dL)   GFR calc non Af Amer 34 (*) >90 (mL/min)   GFR calc Af Amer 40 (*) >90 (mL/min)  CBC      Component Value Range   WBC 15.9 (*) 4.0 - 10.5 (K/uL)     RBC 3.21 (*) 4.22 - 5.81 (MIL/uL)   Hemoglobin 9.4 (*) 13.0 - 17.0 (g/dL)   HCT 24.4 (*) 01.0 - 52.0 (%)   MCV 91.0  78.0 - 100.0 (fL)   MCH 29.3  26.0 - 34.0 (pg)   MCHC 32.2  30.0 - 36.0 (g/dL)   RDW 27.2  53.6 - 64.4 (%)   Platelets 123 (*) 150 - 400 (K/uL)  PROTIME-INR      Component Value Range   Prothrombin Time 18.1 (*)  11.6 - 15.2 (seconds)   INR 1.47  0.00 - 1.49   TROPONIN I      Component Value Range   Troponin I <0.30  <0.30 (ng/mL)  LACTIC ACID, PLASMA      Component Value Range   Lactic Acid, Venous 2.7 (*) 0.5 - 2.2 (mmol/L)  PROTIME-INR      Component Value Range   Prothrombin Time 20.8 (*) 11.6 - 15.2 (seconds)   INR 1.76 (*) 0.00 - 1.49   CBC      Component Value Range   WBC 14.1 (*) 4.0 - 10.5 (K/uL)   RBC 2.93 (*) 4.22 - 5.81 (MIL/uL)   Hemoglobin 8.5 (*) 13.0 - 17.0 (g/dL)   HCT 16.1 (*) 09.6 - 52.0 (%)   MCV 90.8  78.0 - 100.0 (fL)   MCH 29.0  26.0 - 34.0 (pg)   MCHC 32.0  30.0 - 36.0 (g/dL)   RDW 04.5  40.9 - 81.1 (%)   Platelets 97 (*) 150 - 400 (K/uL)  COMPREHENSIVE METABOLIC PANEL      Component Value Range   Sodium 134 (*) 135 - 145 (mEq/L)   Potassium 3.6  3.5 - 5.1 (mEq/L)   Chloride 105  96 - 112 (mEq/L)   CO2 23  19 - 32 (mEq/L)   Glucose, Bld 91  70 - 99 (mg/dL)   BUN 21  6 - 23 (mg/dL)   Creatinine, Ser 9.14 (*) 0.50 - 1.35 (mg/dL)   Calcium 8.2 (*) 8.4 - 10.5 (mg/dL)   Total Protein 5.5 (*) 6.0 - 8.3 (g/dL)   Albumin 2.1 (*) 3.5 - 5.2 (g/dL)   AST 26  0 - 37 (U/L)   ALT 33  0 - 53 (U/L)   Alkaline Phosphatase 82  39 - 117 (U/L)   Total Bilirubin 0.4  0.3 - 1.2 (mg/dL)   GFR calc non Af Amer 43 (*) >90 (mL/min)   GFR calc Af Amer 50 (*) >90 (mL/min)  GLUCOSE, CAPILLARY      Component Value Range   Glucose-Capillary 86  70 - 99 (mg/dL)  PROTIME-INR      Component Value Range   Prothrombin Time 19.6 (*) 11.6 - 15.2 (seconds)   INR 1.63 (*) 0.00 - 1.49   CBC      Component Value Range   WBC 12.8 (*) 4.0 - 10.5 (K/uL)   RBC  3.10 (*) 4.22 - 5.81 (MIL/uL)   Hemoglobin 9.1 (*) 13.0 - 17.0 (g/dL)   HCT 78.2 (*) 95.6 - 52.0 (%)   MCV 90.3  78.0 - 100.0 (fL)   MCH 29.4  26.0 - 34.0 (pg)   MCHC 32.5  30.0 - 36.0 (g/dL)   RDW 21.3  08.6 - 57.8 (%)   Platelets 107 (*) 150 - 400 (K/uL)  BASIC METABOLIC PANEL      Component Value Range   Sodium 136  135 - 145 (mEq/L)   Potassium 3.7  3.5 - 5.1 (mEq/L)   Chloride 105  96 - 112 (mEq/L)   CO2 25  19 - 32 (mEq/L)   Glucose, Bld 86  70 - 99 (mg/dL)   BUN 16  6 - 23 (mg/dL)   Creatinine, Ser 4.69 (*) 0.50 - 1.35 (mg/dL)   Calcium 8.7  8.4 - 62.9 (mg/dL)   GFR calc non Af Amer 47 (*) >90 (mL/min)   GFR calc Af Amer 55 (*) >90 (mL/min)  VITAMIN B12      Component Value Range  Vitamin B-12 580  211 - 911 (pg/mL)  FOLATE      Component Value Range   Folate 2.7 (*)   IRON AND TIBC      Component Value Range   Iron 51  42 - 135 (ug/dL)   TIBC 409 (*) 811 - 914 (ug/dL)   Saturation Ratios 35  20 - 55 (%)   UIBC 96 (*) 125 - 400 (ug/dL)  FERRITIN      Component Value Range   Ferritin 535 (*) 22 - 322 (ng/mL)  RETICULOCYTES      Component Value Range   Retic Ct Pct 0.8  0.4 - 3.1 (%)   RBC. 3.10 (*) 4.22 - 5.81 (MIL/uL)   Retic Count, Manual 24.8  19.0 - 186.0 (K/uL)  GLUCOSE, CAPILLARY      Component Value Range   Glucose-Capillary 82  70 - 99 (mg/dL)   Comment 1 Notify RN     Comment 2 Documented in Chart    PROTIME-INR      Component Value Range   Prothrombin Time 21.1 (*) 11.6 - 15.2 (seconds)   INR 1.79 (*) 0.00 - 1.49   CBC      Component Value Range   WBC 8.5  4.0 - 10.5 (K/uL)   RBC 3.35 (*) 4.22 - 5.81 (MIL/uL)   Hemoglobin 9.9 (*) 13.0 - 17.0 (g/dL)   HCT 78.2 (*) 95.6 - 52.0 (%)   MCV 89.3  78.0 - 100.0 (fL)   MCH 29.6  26.0 - 34.0 (pg)   MCHC 33.1  30.0 - 36.0 (g/dL)   RDW 21.3  08.6 - 57.8 (%)   Platelets 137 (*) 150 - 400 (K/uL)  DIFFERENTIAL      Component Value Range   Neutrophils Relative 79 (*) 43 - 77 (%)   Neutro Abs 6.7  1.7 -  7.7 (K/uL)   Lymphocytes Relative 9 (*) 12 - 46 (%)   Lymphs Abs 0.7  0.7 - 4.0 (K/uL)   Monocytes Relative 9  3 - 12 (%)   Monocytes Absolute 0.8  0.1 - 1.0 (K/uL)   Eosinophils Relative 3  0 - 5 (%)   Eosinophils Absolute 0.2  0.0 - 0.7 (K/uL)   Basophils Relative 1  0 - 1 (%)   Basophils Absolute 0.0  0.0 - 0.1 (K/uL)  BASIC METABOLIC PANEL      Component Value Range   Sodium 139  135 - 145 (mEq/L)   Potassium 3.3 (*) 3.5 - 5.1 (mEq/L)   Chloride 106  96 - 112 (mEq/L)   CO2 26  19 - 32 (mEq/L)   Glucose, Bld 92  70 - 99 (mg/dL)   BUN 13  6 - 23 (mg/dL)   Creatinine, Ser 4.69 (*) 0.50 - 1.35 (mg/dL)   Calcium 9.0  8.4 - 62.9 (mg/dL)   GFR calc non Af Amer 45 (*) >90 (mL/min)   GFR calc Af Amer 52 (*) >90 (mL/min)  GLUCOSE, CAPILLARY      Component Value Range   Glucose-Capillary 91  70 - 99 (mg/dL)  PROTIME-INR      Component Value Range   Prothrombin Time 23.4 (*) 11.6 - 15.2 (seconds)   INR 2.04 (*) 0.00 - 1.49   BASIC METABOLIC PANEL      Component Value Range   Sodium 138  135 - 145 (mEq/L)   Potassium 3.7  3.5 - 5.1 (mEq/L)   Chloride 105  96 - 112 (mEq/L)   CO2 26  19 - 32 (mEq/L)   Glucose, Bld 89  70 - 99 (mg/dL)   BUN 10  6 - 23 (mg/dL)   Creatinine, Ser 4.40 (*) 0.50 - 1.35 (mg/dL)   Calcium 9.0  8.4 - 34.7 (mg/dL)   GFR calc non Af Amer 49 (*) >90 (mL/min)   GFR calc Af Amer 57 (*) >90 (mL/min)  CBC      Component Value Range   WBC 7.3  4.0 - 10.5 (K/uL)   RBC 3.53 (*) 4.22 - 5.81 (MIL/uL)   Hemoglobin 10.2 (*) 13.0 - 17.0 (g/dL)   HCT 42.5 (*) 95.6 - 52.0 (%)   MCV 88.7  78.0 - 100.0 (fL)   MCH 28.9  26.0 - 34.0 (pg)   MCHC 32.6  30.0 - 36.0 (g/dL)   RDW 38.7  56.4 - 33.2 (%)   Platelets 149 (*) 150 - 400 (K/uL)  DIFFERENTIAL      Component Value Range   Neutrophils Relative 65  43 - 77 (%)   Neutro Abs 4.7  1.7 - 7.7 (K/uL)   Lymphocytes Relative 15  12 - 46 (%)   Lymphs Abs 1.1  0.7 - 4.0 (K/uL)   Monocytes Relative 13 (*) 3 - 12 (%)    Monocytes Absolute 0.9  0.1 - 1.0 (K/uL)   Eosinophils Relative 6 (*) 0 - 5 (%)   Eosinophils Absolute 0.4  0.0 - 0.7 (K/uL)   Basophils Relative 1  0 - 1 (%)   Basophils Absolute 0.1  0.0 - 0.1 (K/uL)     Signed: Amelita Risinger NEVILL 07/04/2011, 8:02 AM   p1/30/2013, 8:03 AM

## 2011-07-04 NOTE — Progress Notes (Signed)
CSW has visited with the pt and the pts daughter. Pt expressed that he is very happy that he can discharge home. Pts family plan to transport him home. CSW has called Jill Side at [facility] and let her know that the pt will not return to SNF. Pt denies any needs at this time. CSW signing off.  Patrice Paradise, LCSWA 07/04/2011 10:25 AM [id number]

## 2011-07-05 ENCOUNTER — Ambulatory Visit: Payer: Medicare Other | Admitting: Cardiothoracic Surgery

## 2011-07-20 ENCOUNTER — Ambulatory Visit (INDEPENDENT_AMBULATORY_CARE_PROVIDER_SITE_OTHER): Payer: Medicare Other | Admitting: Cardiology

## 2011-07-20 ENCOUNTER — Encounter: Payer: Self-pay | Admitting: Cardiology

## 2011-07-20 VITALS — BP 118/79 | HR 61 | Ht 72.0 in | Wt 163.0 lb

## 2011-07-20 DIAGNOSIS — I340 Nonrheumatic mitral (valve) insufficiency: Secondary | ICD-10-CM

## 2011-07-20 DIAGNOSIS — N189 Chronic kidney disease, unspecified: Secondary | ICD-10-CM

## 2011-07-20 DIAGNOSIS — I5022 Chronic systolic (congestive) heart failure: Secondary | ICD-10-CM

## 2011-07-20 DIAGNOSIS — I059 Rheumatic mitral valve disease, unspecified: Secondary | ICD-10-CM

## 2011-07-20 DIAGNOSIS — Z7901 Long term (current) use of anticoagulants: Secondary | ICD-10-CM

## 2011-07-20 DIAGNOSIS — I509 Heart failure, unspecified: Secondary | ICD-10-CM

## 2011-07-20 DIAGNOSIS — I2510 Atherosclerotic heart disease of native coronary artery without angina pectoris: Secondary | ICD-10-CM

## 2011-07-20 DIAGNOSIS — I4891 Unspecified atrial fibrillation: Secondary | ICD-10-CM

## 2011-07-20 MED ORDER — LISINOPRIL 10 MG PO TABS: 10.0000 mg | ORAL_TABLET | Freq: Every day | ORAL | Status: DC

## 2011-07-20 MED ORDER — FUROSEMIDE 40 MG PO TABS: 20.0000 mg | ORAL_TABLET | Freq: Every day | ORAL | Status: DC

## 2011-07-20 MED ORDER — WARFARIN SODIUM 3 MG PO TABS: 3.0000 mg | ORAL_TABLET | Freq: Every day | ORAL | Status: DC

## 2011-07-20 MED ORDER — DIGOXIN 250 MCG PO TABS: 250.0000 ug | ORAL_TABLET | Freq: Every day | ORAL | Status: DC

## 2011-07-20 NOTE — Assessment & Plan Note (Signed)
He has chronic kidney disease stage III. Most recent creatinine was 1.38.

## 2011-07-20 NOTE — Assessment & Plan Note (Signed)
He appears to be maintaining sinus rhythm well. He is status post Maze procedure. He had ligation of the left atrial appendage. We will continue with Coumadin therapy. I think his risk of recurrent atrial fibrillation is fairly high. We will establish him in our Coumadin clinic.

## 2011-07-20 NOTE — Assessment & Plan Note (Signed)
Postoperative ejection fraction had improved to 35-40%. He appears to be euvolemic. We will continue with his beta blocker therapy. I will increase his lisinopril to 10 mg daily. I'll followup again in 2 months and we will plan on checking fasting lab work and a dig level at that time.

## 2011-07-20 NOTE — Patient Instructions (Signed)
Continue your current medication.   We will get you established in our coumadin clinic  We will contact Cardiac Rehab to get that started.  I will see you again in 2 months.

## 2011-07-20 NOTE — Assessment & Plan Note (Signed)
He is status post CABG for severe left main and three-vessel coronary disease. Clinically he is doing much better. We will go ahead and refer him to cardiac rehabilitation.

## 2011-07-20 NOTE — Progress Notes (Signed)
Troy Gregory Date of Birth: 1935-06-09 Medical Record #[id number]  History of Present Illness: Troy Gregory is seen today for followup. He was seen in December with sustained atrial fibrillation. He had symptoms of increasing dyspnea and congestive heart failure. He had had prior episodes of syncope last summer. On subsequent evaluation he was found to have severe three-vessel and left main coronary disease. He underwent coronary bypass surgery by Dr. Tyrone Sage. This included an LIMA graft to the LAD, saphenous vein graft to the intermediate, and saphenous vein graft to the right coronary. He also had mitral valve repair with an annuloplasty ring. He underwent a Maze procedure and closure of the left atrial appendage. His postoperative course was complicated by acute delirium. He also developed urinary retention requiring placement of a catheter. After discharge from this hospitalization he was rehospitalized at [facility] in January 25 with Klebsiella sepsis and urinary tract infection. He was treated and the symptoms resolved. He did have an echocardiogram  that admission on January 26 which showed mild LVH. His ejection fraction had improved to 35-40%. He had mild aortic stenosis. There was no mitral regurgitation with moderate left atrial enlargement. He is still on anticoagulation. Chest x-ray during that hospital admission showed cardiomegaly with no active disease. His ECG showed normal sinus rhythm with occasional PVCs. He had chronic ST-T wave changes in the anterolateral leads. Since discharge he has improved. His wife still notes some difficulty with memory but it is improving. He has had no significant pain, shortness of breath, or edema. He is now back home living with his wife.  Current Outpatient Prescriptions on File Prior to Visit  Medication Sig Dispense Refill  . aspirin EC 81 MG EC tablet Take 1 tablet (81 mg total) by mouth daily.      . carvedilol (COREG) 12.5 MG tablet Take  1 tablet (12.5 mg total) by mouth 2 (two) times daily with a meal.  60 tablet  1  . dutasteride (AVODART) 0.5 MG capsule Take 0.5 mg by mouth daily.       . simvastatin (ZOCOR) 20 MG tablet Take 1 tablet (20 mg total) by mouth daily at 6 PM.  30 tablet  1  . Tamsulosin HCl (FLOMAX) 0.4 MG CAPS Take 1 capsule (0.4 mg total) by mouth at bedtime.  30 capsule    . traMADol (ULTRAM) 50 MG tablet Take 50 mg by mouth every 6 (six) hours as needed. pain        No Known Allergies  Past Medical History  Diagnosis Date  . Hypertension   . Atrial fibrillation 06/2011    S/p left sided MAZE. On Coumadin, January, 2013  . Syncope     Near-syncope, etiology is deemed secondary to the excessive heat  . Benign prostatic hypertrophy     but stable  . Chronic anticoagulation   . Coronary artery disease 06/2011    S/p LIMA-LAD, SVG-intermediate coronary (?), and SVG-distal RCA with concurrent MV repair and MAZE  . Mitral regurgitation 06/2011    Mitral valve ring annuloplasty at the time of CABG,, January, 2013  . Shortness of breath   . Chronic kidney disease 12/2010    with a creatinine on discharge of 1.84, was  2.1 on admission  . Calculus of kidney   . H/O hiatal hernia   . Ejection fraction < 50%     EF previously 50%, however 20-25% by echo and catheterization before CABG  . Chronic kidney disease  Past Surgical History  Procedure Date  . Circumcision 08/12/2002    because of phimosis  . Maze 06/06/2011    Procedure: MAZE;  Surgeon: [name], MD;  Location: [facility] OR;  Service: Open Heart Surgery;  Laterality: N/A;  . Mitral valve repair 06/06/2011    Procedure: MITRAL VALVE REPAIR (MVR);  Surgeon: [name], MD;  Location: [facility] OR;  Service: Open Heart Surgery;  Laterality: N/A;  . Coronary artery bypass graft 06/06/2011     grafts times three using left internal mammary artery and right leg greater saphenous vein harvested endoscopically  . Saturation biopsy of prostate      History  Smoking status  . Former Smoker -- 45 years  . Types: Cigarettes  . Quit date: 07/01/1964  Smokeless tobacco  . Former Neurosurgeon  . Types: Chew    History  Alcohol Use No    Family History  Problem Relation Age of Onset  . Cancer Father   . Lung cancer Father   . Diabetes Father   . Hypertension Mother   . Hypertension Brother     Review of Systems: As noted in history of present illness..  All other systems were reviewed and are negative.  Physical Exam: BP 118/79  Pulse 61  Ht 6' (1.829 m)  Wt 163 lb (73.936 kg)  BMI 22.11 kg/m2 He is a pleasant elderly white male in no acute distress. His HEENT exam is unremarkable. His pupils are equal round and reactive. Sclera clear. Oropharynx is clear. Neck is without JVD, adenopathy, thyromegaly, or bruits. Lungs are clear. Cardiac exam reveals a regular rate and rhythm without gallop. There is a soft grade 1/6 systolic ejection murmur at left upper sternal border. Abdomen is soft and nontender without masses or hepatosplenomegaly. He has a urinary catheter in place. He has no lower extremity edema. He is alert and oriented x3 today. He has no focal findings. LABORATORY DATA: On January 30 his INR was 2.04. Creatinine was 1.38. Electrolytes were normal. Hemoglobin was 10.2. Platelet count 149,000.  Assessment / Plan:

## 2011-07-26 NOTE — ED Provider Notes (Signed)
Medical screening examination/treatment/procedure(s) were performed by non-physician practitioner and as supervising physician I was immediately available for consultation/collaboration.   Steele Ledonne L Marcele Kosta, MD 07/26/11 2313 

## 2011-07-27 ENCOUNTER — Encounter: Payer: Medicare Other | Admitting: *Deleted

## 2011-07-27 ENCOUNTER — Ambulatory Visit (INDEPENDENT_AMBULATORY_CARE_PROVIDER_SITE_OTHER): Payer: Medicare Other | Admitting: Pharmacist

## 2011-07-27 DIAGNOSIS — Z7901 Long term (current) use of anticoagulants: Secondary | ICD-10-CM | POA: Insufficient documentation

## 2011-07-27 DIAGNOSIS — I4891 Unspecified atrial fibrillation: Secondary | ICD-10-CM

## 2011-07-27 LAB — POCT INR: INR: 1.7

## 2011-07-31 ENCOUNTER — Other Ambulatory Visit: Payer: Self-pay | Admitting: Cardiothoracic Surgery

## 2011-07-31 DIAGNOSIS — I059 Rheumatic mitral valve disease, unspecified: Secondary | ICD-10-CM

## 2011-07-31 DIAGNOSIS — I2510 Atherosclerotic heart disease of native coronary artery without angina pectoris: Secondary | ICD-10-CM

## 2011-08-02 ENCOUNTER — Encounter: Payer: Self-pay | Admitting: Cardiothoracic Surgery

## 2011-08-02 ENCOUNTER — Ambulatory Visit
Admission: RE | Admit: 2011-08-02 | Discharge: 2011-08-02 | Disposition: A | Discharge status: Home / Self Care | Payer: Medicare Other | Source: Ambulatory Visit | Attending: Cardiothoracic Surgery | Admitting: Cardiothoracic Surgery

## 2011-08-02 ENCOUNTER — Ambulatory Visit (INDEPENDENT_AMBULATORY_CARE_PROVIDER_SITE_OTHER): Payer: Medicare Other | Admitting: Cardiothoracic Surgery

## 2011-08-02 ENCOUNTER — Ambulatory Visit (INDEPENDENT_AMBULATORY_CARE_PROVIDER_SITE_OTHER): Payer: Medicare Other

## 2011-08-02 VITALS — BP 146/90 | HR 52 | Resp 18 | Ht 72.0 in | Wt 162.0 lb

## 2011-08-02 DIAGNOSIS — I059 Rheumatic mitral valve disease, unspecified: Secondary | ICD-10-CM

## 2011-08-02 DIAGNOSIS — Z8679 Personal history of other diseases of the circulatory system: Secondary | ICD-10-CM

## 2011-08-02 DIAGNOSIS — Z7901 Long term (current) use of anticoagulants: Secondary | ICD-10-CM

## 2011-08-02 DIAGNOSIS — I2510 Atherosclerotic heart disease of native coronary artery without angina pectoris: Secondary | ICD-10-CM

## 2011-08-02 DIAGNOSIS — Z9889 Other specified postprocedural states: Secondary | ICD-10-CM

## 2011-08-02 DIAGNOSIS — I4891 Unspecified atrial fibrillation: Secondary | ICD-10-CM

## 2011-08-02 DIAGNOSIS — Z951 Presence of aortocoronary bypass graft: Secondary | ICD-10-CM

## 2011-08-02 LAB — POCT INR: INR: 2.5

## 2011-08-02 NOTE — Patient Instructions (Signed)
CARDIAC REHAB CAN START  Endocarditis Information  You may be at risk for developing endocarditis since you have  an artificial heart valve  or a repaired heart valve. Endocarditis is an infection of the lining of the heart or heart valves.   Certain surgical and dental procedures may put you at risk,  such as teeth cleaning or other dental procedures or any surgery involving the respiratory, urinary, gastrointestinal tract, gallbladder or prostate.   Notify your doctor or dentist before having any invasive procedures. You will need to take antibiotics before certain procedures.   To prevent endocarditis, maintain good oral health. Seek prompt medical attention for any mouth/gum, skin or urinary tract infections.

## 2011-08-02 NOTE — Progress Notes (Signed)
[address]            [place] [address]          [phone]       SHALAMAR CRAYS [facility] Medical Record #[id number] Date of Birth: [birth year]  Troy Gregory, Peter, MD Pearla Dubonnet, MD, MD  Chief Complaint:   PostOp Follow Up Visit DATE OF PROCEDURE: 06/06/2011  DATE OF DISCHARGE:  OPERATIVE REPORT  PREOPERATIVE DIAGNOSES: Severe ischemic cardiomyopathy, with left main  coronary obstruction, moderate-to-severe mitral regurgitation secondary  to annular dilatation, atrial fibrillation.  POSTOPERATIVE DIAGNOSES: Severe ischemic cardiomyopathy, with left main  coronary obstruction, moderate-to-severe mitral regurgitation secondary  to annular dilatation, atrial fibrillation.  SURGICAL PROCEDURE PERFORMED:  1. Coronary artery bypass grafting x3 with the left internal mammary  to the left anterior descending coronary artery, reverse saphenous  vein graft to the intermediate coronary artery, reverse saphenous  vein graft to the distal right coronary artery.  2. Left-sided MAZE procedure.  3. Mitral valve repair with ring annuloplasty with a Sorin ring model  #SMT 30, serial # [id number].  4. Right leg endo vein harvesting.    History of Present Illness:     Patient is a 76 year old male who returns after recent cardiac surgery. His postop course was complicated by urinary retention, history of both preop and postop altered mental status. Ultimately he was discharged to a nursing home. 10 days later was admitted to [facility] long with urosepsis. He's recovered from that and his cardiac surgery slowly but is much improved now and back living at home. His wife notes he continues to have memory issues which was present prior to surgery. He still has a Foley in place. He has had a recent echocardiogram that showed marked improvement in his LV function compared to the preoperative studies. He has no overt symptoms of congestive heart failure, and appears to  be maintaining sinus rhythm.      History  Smoking status  . Former Smoker -- 45 years  . Types: Cigarettes  . Quit date: 07/01/1964  Smokeless tobacco  . Former Neurosurgeon  . Types: Chew       No Known Allergies  Current Outpatient Prescriptions  Medication Sig Dispense Refill  . aspirin EC 81 MG EC tablet Take 1 tablet (81 mg total) by mouth daily.      . carvedilol (COREG) 12.5 MG tablet Take 1 tablet (12.5 mg total) by mouth 2 (two) times daily with a meal.  60 tablet  1  . digoxin (LANOXIN) 0.25 MG tablet Take 1 tablet (250 mcg total) by mouth daily.  30 tablet  11  . dutasteride (AVODART) 0.5 MG capsule Take 0.5 mg by mouth daily.       . furosemide (LASIX) 40 MG tablet Take 0.5 tablets (20 mg total) by mouth daily.  5 tablet  0  . lisinopril (PRINIVIL) 10 MG tablet Take 1 tablet (10 mg total) by mouth daily.  90 tablet  3  . potassium chloride (K-DUR,KLOR-CON) 10 MEQ tablet Take 10 mEq by mouth 4 (four) times daily.      . simvastatin (ZOCOR) 20 MG tablet Take 1 tablet (20 mg total) by mouth daily at 6 PM.  30 tablet  1  . Tamsulosin HCl (FLOMAX) 0.4 MG CAPS Take 1 capsule (0.4 mg total) by mouth at bedtime.  30 capsule    . warfarin (  COUMADIN) 3 MG tablet Take 1 tablet (3 mg total) by mouth daily.  30 tablet  3       Physical Exam: BP 146/90  Pulse 52  Resp 18  Ht 6' (1.829 m)  Wt 162 lb (73.483 kg)  BMI 21.97 kg/m2  SpO2 97%  General appearance: alert, cooperative, appears older than stated age, distracted and no distress Neurologic: intact Heart: regular rate and rhythm, S1, S2 normal, no murmur, click, rub or gallop Lungs: clear to auscultation bilaterally Abdomen: soft, non-tender; bowel sounds normal; no masses,  no organomegaly Extremities: extremities normal, atraumatic, no cyanosis or edema Wound: His sternum is stable and well healed  Foley still in place  Diagnostic Studies & Laboratory data:         Recent Radiology Findings: Dg Chest 2  View  08/02/2011  *RADIOLOGY REPORT*  Clinical Data: CABG  CHEST - 2 VIEW  Comparison: 06/29/2011  Findings: Normal heart size.  Clear lungs.  IMPRESSION: No active cardiopulmonary disease.  Original Report Authenticated By: Donavan Burnet, M.D.      Recent Labs: Lab Results  Component Value Date   WBC 7.3 07/04/2011   HGB 10.2* 07/04/2011   HCT 31.3* 07/04/2011   PLT 149* 07/04/2011   GLUCOSE 89 07/04/2011   ALT 33 07/01/2011   AST 26 07/01/2011   NA 138 07/04/2011   K 3.7 07/04/2011   CL 105 07/04/2011   CREATININE 1.38* 07/04/2011   BUN 10 07/04/2011   CO2 26 07/04/2011   TSH 1.38 05/21/2011   INR 1.7 07/27/2011   HGBA1C 6.1* 06/04/2011      Assessment / Plan:      Patient returns today for followup visit after coronary artery bypass grafting and mitral valve repair with a left sided maze, with preoperative severe LV dysfunction. Considering his preoperative condition he is making good progress at this point appears to be maintaining sinus rhythm. He notes that he's been told he will keep the Foley for several months and has continued to be followed by urology. I've encouraged him to enroll in cardiac rehabilitation program. He continues on Coumadin as monitored by Dr. Elvis Coil office Overall am pleased with his progress, I have not made an appointment to see me back but would glad to see him at his or Dr. Elvis Coil request.      [name] MD 08/02/2011 1:27 PM         5. Closure of left atrial appendage.  SURGEON: Sheliah Plane, MD

## 2011-08-07 ENCOUNTER — Telehealth: Payer: Self-pay

## 2011-08-07 NOTE — Telephone Encounter (Signed)
Johnny [employer] from [employer] called wanting to know last day patient worked and next appointment with Dr. Swaziland.Last day patient worked 04/30/11.Appointment with Dr. Swaziland 09/19/11.

## 2011-08-17 ENCOUNTER — Ambulatory Visit (INDEPENDENT_AMBULATORY_CARE_PROVIDER_SITE_OTHER): Payer: Medicare Other | Admitting: *Deleted

## 2011-08-17 DIAGNOSIS — I4891 Unspecified atrial fibrillation: Secondary | ICD-10-CM

## 2011-08-17 DIAGNOSIS — Z7901 Long term (current) use of anticoagulants: Secondary | ICD-10-CM

## 2011-08-17 LAB — POCT INR: INR: 2.5

## 2011-09-07 ENCOUNTER — Ambulatory Visit (INDEPENDENT_AMBULATORY_CARE_PROVIDER_SITE_OTHER): Payer: Medicare Other | Admitting: Pharmacist

## 2011-09-07 DIAGNOSIS — Z7901 Long term (current) use of anticoagulants: Secondary | ICD-10-CM

## 2011-09-07 DIAGNOSIS — I4891 Unspecified atrial fibrillation: Secondary | ICD-10-CM

## 2011-09-12 ENCOUNTER — Telehealth: Payer: Self-pay | Admitting: Cardiology

## 2011-09-12 MED ORDER — LISINOPRIL 20 MG PO TABS: 20.0000 mg | ORAL_TABLET | Freq: Every day | ORAL | Status: DC

## 2011-09-12 NOTE — Telephone Encounter (Signed)
B/p is 176/108 and they wanted talk to someone regarding this

## 2011-09-12 NOTE — Telephone Encounter (Signed)
Spoke with patient's wife Ardine Eng, about pt's B/P being high for the last week. Today's B/P is 176/108.patient is taken Carvedilol 12.5 mg one tablet by mouth twice a day, Lisinopril 10 mg one tablet by mouth daily, and Lasix 20 mg daily. Wife states patient has an appointment with Dr. Swaziland on 09/19/11 at 3:00 PM. She wold like to have some medication called to the pharmacy before then.

## 2011-09-12 NOTE — Telephone Encounter (Signed)
Spoke to patient's wife was told Dr.Jordan increased lisinopril to 20 mg daily.Keep appointment 09/19/11.

## 2011-09-19 ENCOUNTER — Ambulatory Visit (INDEPENDENT_AMBULATORY_CARE_PROVIDER_SITE_OTHER): Payer: Medicare Other | Admitting: Cardiology

## 2011-09-19 ENCOUNTER — Ambulatory Visit (INDEPENDENT_AMBULATORY_CARE_PROVIDER_SITE_OTHER): Payer: Medicare Other | Admitting: *Deleted

## 2011-09-19 ENCOUNTER — Encounter: Payer: Self-pay | Admitting: Cardiology

## 2011-09-19 VITALS — BP 130/82 | HR 52 | Ht 72.0 in | Wt 165.0 lb

## 2011-09-19 DIAGNOSIS — Z7901 Long term (current) use of anticoagulants: Secondary | ICD-10-CM

## 2011-09-19 DIAGNOSIS — I2510 Atherosclerotic heart disease of native coronary artery without angina pectoris: Secondary | ICD-10-CM

## 2011-09-19 DIAGNOSIS — R413 Other amnesia: Secondary | ICD-10-CM

## 2011-09-19 DIAGNOSIS — I4891 Unspecified atrial fibrillation: Secondary | ICD-10-CM

## 2011-09-19 DIAGNOSIS — I5022 Chronic systolic (congestive) heart failure: Secondary | ICD-10-CM

## 2011-09-19 DIAGNOSIS — I509 Heart failure, unspecified: Secondary | ICD-10-CM

## 2011-09-19 NOTE — Assessment & Plan Note (Addendum)
He remains in sinus rhythm post Maze procedure. He has a [place] score of at least 2. We will continue anticoagulation with Coumadin for the time being.

## 2011-09-19 NOTE — Assessment & Plan Note (Signed)
His wife is concerned about his ongoing memory loss. He did have acute delirium following his bypass surgery. I have requested neurologic evaluation.

## 2011-09-19 NOTE — Patient Instructions (Signed)
I want to check fasting lab work on you.  Continue your current medications.  I will see you again in 3 months.

## 2011-09-19 NOTE — Progress Notes (Signed)
Troy Gregory Date of Birth: 23-Nov-1935 Medical Record #[id number]  History of Present Illness: Troy Gregory is seen today for followup. He is making slow progress. Recently his blood pressure increased but his wife thinks it may have been related to something they had to eat because her blood pressure went up to. We adjusted his medication and it has improved. He still has a Foley catheter in place. He has followup with urology next month. His wife is concerned because his memory is still quite poor. He is driving only short distances. He is still weak. He denies any significant chest pain or shortness of breath. He has had no significant palpitations or dizziness.  Current Outpatient Prescriptions on File Prior to Visit  Medication Sig Dispense Refill  . aspirin EC 81 MG EC tablet Take 1 tablet (81 mg total) by mouth daily.      . carvedilol (COREG) 12.5 MG tablet Take 1 tablet (12.5 mg total) by mouth 2 (two) times daily with a meal.  60 tablet  1  . digoxin (LANOXIN) 0.25 MG tablet Take 1 tablet (250 mcg total) by mouth daily.  30 tablet  11  . dutasteride (AVODART) 0.5 MG capsule Take 0.5 mg by mouth daily.       . furosemide (LASIX) 40 MG tablet Take 0.5 tablets (20 mg total) by mouth daily.  5 tablet  0  . lisinopril (PRINIVIL,ZESTRIL) 20 MG tablet Take 1 tablet (20 mg total) by mouth daily.  30 tablet  11  . potassium chloride (K-DUR,KLOR-CON) 10 MEQ tablet Take 10 mEq by mouth 4 (four) times daily.      . simvastatin (ZOCOR) 20 MG tablet Take 1 tablet (20 mg total) by mouth daily at 6 PM.  30 tablet  1  . Tamsulosin HCl (FLOMAX) 0.4 MG CAPS Take 1 capsule (0.4 mg total) by mouth at bedtime.  30 capsule    . warfarin (COUMADIN) 3 MG tablet Take 1 tablet (3 mg total) by mouth daily.  30 tablet  3    No Known Allergies  Past Medical History  Diagnosis Date  . Hypertension   . Atrial fibrillation 06/2011    S/p left sided MAZE. On Coumadin, January, 2013  . Syncope     Near-syncope,  etiology is deemed secondary to the excessive heat  . Benign prostatic hypertrophy     but stable  . Chronic anticoagulation   . Coronary artery disease 06/2011    S/p LIMA-LAD, SVG-intermediate coronary (?), and SVG-distal RCA with concurrent MV repair and MAZE  . Mitral regurgitation 06/2011    Mitral valve ring annuloplasty at the time of CABG,, January, 2013  . Shortness of breath   . Chronic kidney disease 12/2010    with a creatinine on discharge of 1.84, was  2.1 on admission  . Calculus of kidney   . H/O hiatal hernia   . Ejection fraction < 50%     EF previously 50%, however 20-25% by echo and catheterization before CABG  . Chronic kidney disease     Past Surgical History  Procedure Date  . Circumcision 08/12/2002    because of phimosis  . Maze 06/06/2011    Procedure: MAZE;  Surgeon: [name], MD;  Location: [facility] OR;  Service: Open Heart Surgery;  Laterality: N/A;  . Mitral valve repair 06/06/2011    Procedure: MITRAL VALVE REPAIR (MVR);  Surgeon: [name], MD;  Location[facility] OR;  Service: Open Heart Surgery;  Laterality:  N/A;  . Coronary artery bypass graft 06/06/2011     grafts times three using left internal mammary artery and right leg greater saphenous vein harvested endoscopically  . Saturation biopsy of prostate     History  Smoking status  . Former Smoker -- 45 years  . Types: Cigarettes  . Quit date: 07/01/1964  Smokeless tobacco  . Former Neurosurgeon  . Types: Chew    History  Alcohol Use No    Family History  Problem Relation Age of Onset  . Cancer Father   . Lung cancer Father   . Diabetes Father   . Hypertension Mother   . Hypertension Brother     Review of Systems: As noted in history of present illness..  All other systems were reviewed and are negative.  Physical Exam: BP 130/82  Pulse 52  Ht 6' (1.829 m)  Wt 165 lb (74.844 kg)  BMI 22.38 kg/m2 He is a pleasant elderly white male in no acute distress. His HEENT exam is  unremarkable. His pupils are equal round and reactive. Sclera clear. Oropharynx is clear. Neck is without JVD, adenopathy, thyromegaly, or bruits. Lungs are clear. Cardiac exam reveals a regular rate and rhythm without gallop. There is a soft grade 1/6 systolic ejection murmur at left upper sternal border. Abdomen is soft and nontender without masses or hepatosplenomegaly. He has a urinary catheter in place. He has no lower extremity edema. He is alert and oriented x3 today. He has no focal findings. LABORATORY DATA: ECG today demonstrates sinus bradycardia. There ST-T wave changes consistent with lateral ischemia.  Assessment / Plan:

## 2011-09-19 NOTE — Assessment & Plan Note (Addendum)
He appears to be well compensated on exam. Most recent ejection fraction was 35-40%. This is improved from his preoperative ejection fraction of 20-25%. We'll continue with ACE inhibitor, beta blocker, and diuretic therapy. We may consider stopping his digoxin.

## 2011-09-19 NOTE — Assessment & Plan Note (Addendum)
He is status post coronary bypass surgery in December. He has recovered fairly well from this but is still deconditioned because of his other medical problems including his memory loss and his urinary retention. We will continue therapy with aspirin and carvedilol. He is on statin therapy. Given his other medical issues I do not feel that he is stable to return to work. 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 We will await evaluation with both urology and neurology in followup again in 3 months. I will schedule him for fasting lab work including chemistries, lipid panel, CBC, and dig level.

## 2011-09-20 ENCOUNTER — Telehealth: Payer: Self-pay | Admitting: Cardiology

## 2011-09-20 NOTE — Telephone Encounter (Signed)
LMOM for Pt, His paperwork From [employer] Ready For Pick Up 09/20/11/KM

## 2011-09-21 ENCOUNTER — Other Ambulatory Visit (INDEPENDENT_AMBULATORY_CARE_PROVIDER_SITE_OTHER): Payer: Medicare Other

## 2011-09-21 DIAGNOSIS — I4891 Unspecified atrial fibrillation: Secondary | ICD-10-CM

## 2011-09-21 DIAGNOSIS — I2510 Atherosclerotic heart disease of native coronary artery without angina pectoris: Secondary | ICD-10-CM

## 2011-09-21 DIAGNOSIS — I509 Heart failure, unspecified: Secondary | ICD-10-CM

## 2011-09-21 LAB — CBC WITH DIFFERENTIAL/PLATELET
Basophils Relative: 0.8 % (ref 0.0–3.0)
Eosinophils Relative: 3.1 % (ref 0.0–5.0)
HCT: 39.8 % (ref 39.0–52.0)
Hemoglobin: 13.2 g/dL (ref 13.0–17.0)
Lymphs Abs: 0.9 10*3/uL (ref 0.7–4.0)
Monocytes Relative: 11.1 % (ref 3.0–12.0)
Neutro Abs: 4.1 10*3/uL (ref 1.4–7.7)
RBC: 4.38 Mil/uL (ref 4.22–5.81)
RDW: 15.5 % — ABNORMAL HIGH (ref 11.5–14.6)
WBC: 6 10*3/uL (ref 4.5–10.5)

## 2011-09-21 LAB — LIPID PANEL
Cholesterol: 142 mg/dL (ref 0–200)
LDL Cholesterol: 70 mg/dL (ref 0–99)

## 2011-09-21 LAB — HEPATIC FUNCTION PANEL
ALT: 13 U/L (ref 0–53)
AST: 21 U/L (ref 0–37)
Total Bilirubin: 0.3 mg/dL (ref 0.3–1.2)

## 2011-09-21 LAB — BASIC METABOLIC PANEL
BUN: 10 mg/dL (ref 6–23)
Chloride: 102 mEq/L (ref 96–112)
Potassium: 3.6 mEq/L (ref 3.5–5.1)

## 2011-09-21 NOTE — Telephone Encounter (Addendum)
Patient picked up his paperwork from [employer]...djc

## 2011-09-22 ENCOUNTER — Telehealth: Payer: Self-pay | Admitting: Physician Assistant

## 2011-09-22 LAB — DIGOXIN LEVEL: Digoxin Level: 3 ng/mL (ref 0.8–2.0)

## 2011-09-22 NOTE — Telephone Encounter (Signed)
Received call from [facility] labs that digoxin level was high at 3.0. Called patient at home who had me talk to his wife. The patient feels weak, but no different than he was when he saw Dr. Swaziland. He is not dizzy, no syncope - just "weak." Discussed with Dr. Myrtis Ser - instructed pt to increase KCl for today (med list is different than what he takes - his wife says he takes bid so we will increase to bid for today) and stop digoxin completely. I welcomed them to proceed to ER if they become worried about how Mr. Tuckey is feeling, she verbalized understanding and gratitude. She will be calling the office Monday for further instructions regarding f/u labwork.   Keiandra Sullenger PA-C

## 2011-09-24 ENCOUNTER — Telehealth: Payer: Self-pay | Admitting: Cardiology

## 2011-09-24 ENCOUNTER — Other Ambulatory Visit: Payer: Self-pay | Admitting: Neurology

## 2011-09-24 ENCOUNTER — Other Ambulatory Visit (INDEPENDENT_AMBULATORY_CARE_PROVIDER_SITE_OTHER): Payer: Medicare Other

## 2011-09-24 ENCOUNTER — Ambulatory Visit (INDEPENDENT_AMBULATORY_CARE_PROVIDER_SITE_OTHER): Payer: Medicare Other | Admitting: Neurology

## 2011-09-24 ENCOUNTER — Encounter: Payer: Self-pay | Admitting: Neurology

## 2011-09-24 VITALS — BP 126/80 | HR 80 | Wt 169.0 lb

## 2011-09-24 DIAGNOSIS — R413 Other amnesia: Secondary | ICD-10-CM

## 2011-09-24 DIAGNOSIS — R7309 Other abnormal glucose: Secondary | ICD-10-CM

## 2011-09-24 DIAGNOSIS — G609 Hereditary and idiopathic neuropathy, unspecified: Secondary | ICD-10-CM

## 2011-09-24 LAB — COMPREHENSIVE METABOLIC PANEL
ALT: 12 U/L (ref 0–53)
Alkaline Phosphatase: 62 U/L (ref 39–117)
CO2: 31 mEq/L (ref 19–32)
Sodium: 142 mEq/L (ref 135–145)
Total Bilirubin: 0.3 mg/dL (ref 0.3–1.2)
Total Protein: 7.2 g/dL (ref 6.0–8.3)

## 2011-09-24 LAB — CBC WITH DIFFERENTIAL/PLATELET
Eosinophils Absolute: 0.2 10*3/uL (ref 0.0–0.7)
MCHC: 33.3 g/dL (ref 30.0–36.0)
MCV: 89.6 fl (ref 78.0–100.0)
Monocytes Absolute: 0.7 10*3/uL (ref 0.1–1.0)
Neutrophils Relative %: 72.6 % (ref 43.0–77.0)
Platelets: 167 10*3/uL (ref 150.0–400.0)
WBC: 5.7 10*3/uL (ref 4.5–10.5)

## 2011-09-24 LAB — VITAMIN B12: Vitamin B-12: 395 pg/mL (ref 211–911)

## 2011-09-24 NOTE — Telephone Encounter (Signed)
Patient's wife called was told patient's lab results.Stated she received phone call this past Saturday to stop digoxin.States repeat dig level was drawn at Dr.Wang's office this morning.
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
## 2011-09-24 NOTE — Patient Instructions (Signed)
Go to the basement to have your labs drawn today.  Your MRI is scheduled for Thursday, April 25th at 3:00pm.  Please arrive to [facility] by 2:45pm.  [phone].  Dr. Leonides Cave will call you directly to schedule your appointment for the memory loss testing.  His office is located at [address] [phone].

## 2011-09-24 NOTE — Telephone Encounter (Signed)
New msg [facility] calling about order for pt ref number [id number]

## 2011-09-24 NOTE — Telephone Encounter (Signed)
Unable to reach [facility] [phone].Number rings busy.

## 2011-09-24 NOTE — Telephone Encounter (Signed)
Pt's wife rtn call  °

## 2011-09-25 LAB — C-REACTIVE PROTEIN: CRP: 0.23 mg/dL (ref <0.60)

## 2011-09-25 NOTE — Telephone Encounter (Signed)
[facility] was called back at # [phone] about message received 09/24/11.[facility] stated they received order for digoxin level wanting to make sure that was all lab needed. Digoxin level was only lab ordered.

## 2011-09-26 ENCOUNTER — Telehealth: Payer: Self-pay | Admitting: Cardiology

## 2011-09-26 LAB — SPEP & IFE WITH QIG
Beta 2: 6.3 % (ref 3.2–6.5)
Beta Globulin: 5.2 % (ref 4.7–7.2)
IgA: 431 mg/dL — ABNORMAL HIGH (ref 68–379)
IgG (Immunoglobin G), Serum: 1480 mg/dL (ref 650–1600)

## 2011-09-26 NOTE — Telephone Encounter (Signed)
Pt's wife rtn call to cheryl from yesterday, pls call [phone]

## 2011-09-26 NOTE — Telephone Encounter (Signed)
Patient's wife was called back, she stated patient had lab work at Dr.Wang's office yesterday 09/25/11.

## 2011-09-27 ENCOUNTER — Ambulatory Visit (HOSPITAL_COMMUNITY)
Admission: RE | Admit: 2011-09-27 | Discharge: 2011-09-27 | Disposition: A | Discharge status: Home / Self Care | Payer: Medicare Other | Source: Ambulatory Visit | Attending: Neurology | Admitting: Neurology

## 2011-09-27 DIAGNOSIS — F29 Unspecified psychosis not due to a substance or known physiological condition: Secondary | ICD-10-CM | POA: Insufficient documentation

## 2011-09-27 DIAGNOSIS — R413 Other amnesia: Secondary | ICD-10-CM | POA: Insufficient documentation

## 2011-09-27 DIAGNOSIS — G609 Hereditary and idiopathic neuropathy, unspecified: Secondary | ICD-10-CM

## 2011-09-27 LAB — METHYLMALONIC ACID, SERUM: Methylmalonic Acid, Quant: 0.51 umol/L — ABNORMAL HIGH (ref <0.40)

## 2011-10-01 ENCOUNTER — Telehealth: Payer: Self-pay | Admitting: Neurology

## 2011-10-01 NOTE — Progress Notes (Signed)
PN labs revealed the possibility of a slight band restriction in the gamma region.  I am going to just follow this, as I don't think it is that significant.

## 2011-10-01 NOTE — Telephone Encounter (Signed)
Called and spoke with the patient's wife. Information given re: B12 supplement and MRI brain results. She states they will start the B12. She did say they will see Dr. Leonides Cave in May. No additional concerns or issues voiced at this time.

## 2011-10-01 NOTE — Telephone Encounter (Signed)
Message copied by Benay Spice on Mon Oct 01, 2011  2:09 PM ------      Message from: Denton Meek H      Created: Mon Oct 01, 2011 12:30 PM       Also let the patient's wife know that his MRI did show some changes we see in patients who have heart disease, high blood pressure for a long time.  No obvious strokes.

## 2011-10-03 ENCOUNTER — Ambulatory Visit (INDEPENDENT_AMBULATORY_CARE_PROVIDER_SITE_OTHER): Payer: Medicare Other | Admitting: Pharmacist

## 2011-10-03 DIAGNOSIS — I4891 Unspecified atrial fibrillation: Secondary | ICD-10-CM

## 2011-10-03 DIAGNOSIS — Z7901 Long term (current) use of anticoagulants: Secondary | ICD-10-CM

## 2011-10-24 ENCOUNTER — Ambulatory Visit (INDEPENDENT_AMBULATORY_CARE_PROVIDER_SITE_OTHER): Payer: Medicare Other | Admitting: Pharmacist

## 2011-10-24 DIAGNOSIS — I4891 Unspecified atrial fibrillation: Secondary | ICD-10-CM

## 2011-10-24 DIAGNOSIS — Z7901 Long term (current) use of anticoagulants: Secondary | ICD-10-CM

## 2011-10-24 LAB — POCT INR: INR: 2.3

## 2011-10-26 DIAGNOSIS — R413 Other amnesia: Secondary | ICD-10-CM

## 2011-11-05 ENCOUNTER — Other Ambulatory Visit: Payer: Self-pay | Admitting: Cardiology

## 2011-11-15 ENCOUNTER — Other Ambulatory Visit: Payer: Self-pay | Admitting: Cardiology

## 2011-11-22 ENCOUNTER — Ambulatory Visit (INDEPENDENT_AMBULATORY_CARE_PROVIDER_SITE_OTHER): Payer: Medicare Other | Admitting: *Deleted

## 2011-11-22 DIAGNOSIS — Z7901 Long term (current) use of anticoagulants: Secondary | ICD-10-CM

## 2011-11-22 DIAGNOSIS — I4891 Unspecified atrial fibrillation: Secondary | ICD-10-CM

## 2011-11-22 LAB — POCT INR: INR: 2.1

## 2011-12-20 ENCOUNTER — Ambulatory Visit (INDEPENDENT_AMBULATORY_CARE_PROVIDER_SITE_OTHER): Payer: Medicare Other | Admitting: *Deleted

## 2011-12-20 DIAGNOSIS — Z7901 Long term (current) use of anticoagulants: Secondary | ICD-10-CM

## 2011-12-20 DIAGNOSIS — I4891 Unspecified atrial fibrillation: Secondary | ICD-10-CM

## 2011-12-24 ENCOUNTER — Ambulatory Visit (INDEPENDENT_AMBULATORY_CARE_PROVIDER_SITE_OTHER): Payer: Medicare Other | Admitting: Neurology

## 2011-12-24 ENCOUNTER — Encounter: Payer: Self-pay | Admitting: Neurology

## 2011-12-24 VITALS — BP 110/72 | HR 72 | Wt 175.0 lb

## 2011-12-24 DIAGNOSIS — R413 Other amnesia: Secondary | ICD-10-CM

## 2011-12-24 MED ORDER — DONEPEZIL HCL 10 MG PO TABS: 10.0000 mg | ORAL_TABLET | Freq: Every day | ORAL | Status: DC

## 2011-12-24 MED ORDER — DONEPEZIL HCL 5 MG PO TABS: 5.0000 mg | ORAL_TABLET | Freq: Every day | ORAL | Status: DC

## 2011-12-24 NOTE — Progress Notes (Signed)
Dear Dr. Kevan Ny,  I saw  [name] back in [facility] for his problem with memory loss and asymptomatic peripheral neuropathy.  As you may recall, he is a 76 y.o. year old male with a history of a MVR, CABG and ablation in January 2013 who has had previous memory problems.  After surgery he noted worsening memory problems.  When I saw him I felt he likely had MCI amnestic type, and sent him for NP testing as well as an MRI.  His MRI revealed mild to moderate white matter changes and generalized atrophy, although not a diagnostic pattern of atrophy.  His NP testing was consistent with MCI/mild dementia.  In addition, his SPEP for his asymptomatic peripheral neuropathy revealed an area of slight restricted mobility in the IgG and lambda lanes - I felt this was unlikely to be related to his peripheral neuropathy however.  Also his MMA was slightly elevated and I instructed him to start on B12 replacement.  The patient has continued to have problems with urinary retention due presumably to BPH and continues to wear a catheter.  Otherwise he has had no significant changes in his memory.  No behavioral problems.  Current Outpatient Prescriptions on File Prior to Visit  Medication Sig Dispense Refill  . aspirin EC 81 MG EC tablet Take 1 tablet (81 mg total) by mouth daily.      . carvedilol (COREG) 12.5 MG tablet Take 1 tablet (12.5 mg total) by mouth 2 (two) times daily with a meal.  60 tablet  1  . digoxin (LANOXIN) 0.25 MG tablet Take 1 tablet (250 mcg total) by mouth daily.  30 tablet  11  . dutasteride (AVODART) 0.5 MG capsule Take 0.5 mg by mouth daily.       . furosemide (LASIX) 40 MG tablet TAKE 1/2 TABLET BY MOUTH DAILY  30 tablet  12  . lisinopril (PRINIVIL,ZESTRIL) 20 MG tablet Take 1 tablet (20 mg total) by mouth daily.  30 tablet  11  . potassium chloride (K-DUR,KLOR-CON) 10 MEQ tablet Take 10 mEq by mouth 4 (four) times daily.      . simvastatin (ZOCOR) 20 MG tablet  Take 1 tablet (20 mg total) by mouth daily at 6 PM.  30 tablet  1  . Tamsulosin HCl (FLOMAX) 0.4 MG CAPS Take 1 capsule (0.4 mg total) by mouth at bedtime.  30 capsule    . warfarin (COUMADIN) 3 MG tablet TAKE 1 TABLET BY MOUTH ONCE DAILY  35 tablet  3  . donepezil (ARICEPT) 10 MG tablet Take 1 tablet (10 mg total) by mouth at bedtime.  30 tablet  3    No Known Allergies  ROS:  13 systems were reviewed and  are unremarkable.  Exam: . Filed Vitals:   12/24/11 1025  BP: 110/72  Pulse: 72  Weight: 175 lb (79.379 kg)    In general, well appearing man.    Cranial Nerves:  Extraocular movements are intact without nystagmus.. Muscles of facial expression are symmetric.  Tongue protrusion, uvula, palate midline.  Shoulder shrug intact  Motor:  Normal bulk and tone, no drift and 5/5 muscle strength bilaterally.  Reflexes:  2+ thoughout, 1+ in ankles.  Coordination:  Normal finger to nose  Sensory loss:  only checked temperature today, but normal in upper extremities, normalizes at mid calf in lower extremities.    Gait:  Normal gait and station.  Romberg negative.  Impression/Recommendations:  1.  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  I have kept the diagnosis vague because at this point I am not certain about it.  I have offered them donepezil 5mg ->10mg  and they are going to try that. 2.  Asymptomatic loss of sensation in feet - likely peripheral neuropathy.  However, no further workup needed at this time as he has no weakness and no functionally impactful pain or sensory loss.  He should continue B12 oral supplementation.  3.  Possible MGUS - I don't think this is of great clinical significance but I will leave it up to you whether you want to refer him to heme onc or repeat his SPEP/IFE in 6 months or so.  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   Lupita Raider Modesto Charon, MD [facility], [place]

## 2012-01-03 ENCOUNTER — Encounter: Payer: Self-pay | Admitting: Cardiology

## 2012-01-03 ENCOUNTER — Ambulatory Visit (INDEPENDENT_AMBULATORY_CARE_PROVIDER_SITE_OTHER): Payer: Medicare Other | Admitting: Cardiology

## 2012-01-03 VITALS — BP 118/78 | HR 55 | Ht 72.0 in | Wt 177.0 lb

## 2012-01-03 DIAGNOSIS — I5022 Chronic systolic (congestive) heart failure: Secondary | ICD-10-CM

## 2012-01-03 DIAGNOSIS — I2510 Atherosclerotic heart disease of native coronary artery without angina pectoris: Secondary | ICD-10-CM

## 2012-01-03 DIAGNOSIS — R413 Other amnesia: Secondary | ICD-10-CM

## 2012-01-03 DIAGNOSIS — I4891 Unspecified atrial fibrillation: Secondary | ICD-10-CM

## 2012-01-03 DIAGNOSIS — I509 Heart failure, unspecified: Secondary | ICD-10-CM

## 2012-01-03 DIAGNOSIS — I10 Essential (primary) hypertension: Secondary | ICD-10-CM

## 2012-01-03 NOTE — Assessment & Plan Note (Signed)
He is well compensated today. Continue digoxin, carvedilol, Lasix, and lisinopril.

## 2012-01-03 NOTE — Progress Notes (Signed)
[name] Date of Birth: 1935-08-06 Medical Record #[id number]  History of Present Illness: Mr. Tirado is seen today for followup. He continues to do very well from a cardiac standpoint. He denies any symptoms of chest pain, shortness of breath, or palpitations. He has had extensive neurologic evaluation. He was not felt to have Alzheimer's disease but was prescribed Aricept. He continues to have a Foley in place. He is scheduled for a CT of his pelvis tomorrow. There is consideration for him to have a TURP.  Current Outpatient Prescriptions on File Prior to Visit  Medication Sig Dispense Refill  . carvedilol (COREG) 12.5 MG tablet Take 1 tablet (12.5 mg total) by mouth 2 (two) times daily with a meal.  60 tablet  1  . Cyanocobalamin (VITAMIN B 12 PO) Take by mouth.      . digoxin (LANOXIN) 0.25 MG tablet Take 1 tablet (250 mcg total) by mouth daily.  30 tablet  11  . donepezil (ARICEPT) 10 MG tablet Take 1 tablet (10 mg total) by mouth at bedtime.  30 tablet  3  . dutasteride (AVODART) 0.5 MG capsule Take 0.5 mg by mouth daily.       . fish oil-omega-3 fatty acids 1000 MG capsule Take 1 g by mouth daily.      . furosemide (LASIX) 40 MG tablet TAKE 1/2 TABLET BY MOUTH DAILY  30 tablet  12  . lisinopril (PRINIVIL,ZESTRIL) 20 MG tablet Take 1 tablet (20 mg total) by mouth daily.  30 tablet  11  . potassium chloride (K-DUR,KLOR-CON) 10 MEQ tablet Take 10 mEq by mouth 4 (four) times daily.      . simvastatin (ZOCOR) 20 MG tablet Take 1 tablet (20 mg total) by mouth daily at 6 PM.  30 tablet  1  . Tamsulosin HCl (FLOMAX) 0.4 MG CAPS Take 1 capsule (0.4 mg total) by mouth at bedtime.  30 capsule    . warfarin (COUMADIN) 3 MG tablet TAKE 1 TABLET BY MOUTH ONCE DAILY  35 tablet  3  . DISCONTD: donepezil (ARICEPT) 5 MG tablet Take 1 tablet (5 mg total) by mouth at bedtime.  30 tablet  0    No Known Allergies  Past Medical History  Diagnosis Date  . Hypertension   . Atrial fibrillation  06/2011    S/p left sided MAZE. On Coumadin, January, 2013  . Syncope     Near-syncope, etiology is deemed secondary to the excessive heat  . Benign prostatic hypertrophy     but stable  . Chronic anticoagulation   . Coronary artery disease 06/2011    S/p LIMA-LAD, SVG-intermediate coronary (?), and SVG-distal RCA with concurrent MV repair and MAZE  . Mitral regurgitation 06/2011    Mitral valve ring annuloplasty at the time of CABG,, January, 2013  . Shortness of breath   . Chronic kidney disease 12/2010    with a creatinine on discharge of 1.84, was  2.1 on admission  . Calculus of kidney   . H/O hiatal hernia   . Ejection fraction < 50%     EF previously 50%, however 20-25% by echo and catheterization before CABG  . Chronic kidney disease     Past Surgical History  Procedure Date  . Circumcision 08/12/2002    because of phimosis  . Maze 06/06/2011    Procedure: MAZE;  Surgeon: [name], MD;  Location: [facility] OR;  Service: Open Heart Surgery;  Laterality: N/A;  . Mitral valve repair 06/06/2011  Procedure: MITRAL VALVE REPAIR (MVR);  Surgeon: [name], MD;  Location: [facility] OR;  Service: Open Heart Surgery;  Laterality: N/A;  . Coronary artery bypass graft 06/06/2011     grafts times three using left internal mammary artery and right leg greater saphenous vein harvested endoscopically  . Saturation biopsy of prostate     History  Smoking status  . Former Smoker -- 45 years  . Types: Cigarettes  . Quit date: 07/01/1964  Smokeless tobacco  . Former Neurosurgeon  . Types: Chew    History  Alcohol Use No    Family History  Problem Relation Age of Onset  . Cancer Father   . Lung cancer Father   . Diabetes Father   . Hypertension Mother   . Hypertension Brother     Review of Systems: As noted in history of present illness..  All other systems were reviewed and are negative.  Physical Exam: BP 118/78  Pulse 55  Ht 6' (1.829 m)  Wt 80.287 kg (177 lb)  BMI 24.01  kg/m2  SpO2 98% He is a pleasant elderly white male in no acute distress. His HEENT exam is unremarkable. His pupils are equal round and reactive. Sclera clear. Oropharynx is clear. Neck is without JVD, adenopathy, thyromegaly, or bruits. Lungs are clear. Cardiac exam reveals a regular rate and rhythm without gallop. There is a soft grade 1/6 systolic ejection murmur at left upper sternal border. Abdomen is soft and nontender without masses or hepatosplenomegaly. He has a urinary catheter in place. He has no lower extremity edema. He is alert and oriented x3 today. He has no focal findings. LABORATORY DATA: Most recent INR was 1.9.  Assessment / Plan:

## 2012-01-03 NOTE — Patient Instructions (Signed)
Stop taking ASA.  Continue your other therapy.  I will see you again in 6 months.

## 2012-01-03 NOTE — Assessment & Plan Note (Signed)
His rate is well controlled. Continue anticoagulation with Coumadin. If he does need a TURP he would be cleared from a cardiac standpoint and can stop his Coumadin 5 days before.

## 2012-01-03 NOTE — Assessment & Plan Note (Signed)
Blood pressure is well controlled. We will continue with carvedilol and lisinopril.

## 2012-01-03 NOTE — Assessment & Plan Note (Signed)
He is now 6 months status post CABG. He has made an excellent recovery except for persistent memory loss. We will continue with his risk factor modification. Continue statin therapy.

## 2012-01-09 ENCOUNTER — Ambulatory Visit (INDEPENDENT_AMBULATORY_CARE_PROVIDER_SITE_OTHER): Payer: Medicare Other | Admitting: Pharmacist

## 2012-01-09 DIAGNOSIS — Z7901 Long term (current) use of anticoagulants: Secondary | ICD-10-CM

## 2012-01-09 DIAGNOSIS — I4891 Unspecified atrial fibrillation: Secondary | ICD-10-CM

## 2012-01-09 LAB — POCT INR: INR: 2.3

## 2012-01-15 ENCOUNTER — Other Ambulatory Visit: Payer: Self-pay | Admitting: Urology

## 2012-02-01 ENCOUNTER — Other Ambulatory Visit: Payer: Self-pay | Admitting: Urology

## 2012-02-01 ENCOUNTER — Ambulatory Visit (INDEPENDENT_AMBULATORY_CARE_PROVIDER_SITE_OTHER): Payer: Medicare Other | Admitting: Pharmacist

## 2012-02-01 ENCOUNTER — Telehealth: Payer: Self-pay

## 2012-02-01 ENCOUNTER — Encounter (HOSPITAL_COMMUNITY): Payer: Self-pay | Admitting: Pharmacy Technician

## 2012-02-01 DIAGNOSIS — Z7901 Long term (current) use of anticoagulants: Secondary | ICD-10-CM

## 2012-02-01 DIAGNOSIS — I4891 Unspecified atrial fibrillation: Secondary | ICD-10-CM

## 2012-02-01 LAB — POCT INR: INR: 1.7

## 2012-02-01 NOTE — Telephone Encounter (Signed)
Dr.Woodruff's office called was told patient was seen by Dr.Jordan 01/03/12 and his dictation reads he is cleared from a cardiac standpoint to have a TURP and can stop coumadin 5 days before.

## 2012-02-05 ENCOUNTER — Encounter: Payer: Self-pay | Admitting: Pharmacist

## 2012-02-05 NOTE — Telephone Encounter (Signed)
Message copied by Velda Shell on Tue Feb 05, 2012  4:02 PM ------      Message from: [place], PETER M      Created: Fri Feb 01, 2012  8:34 PM      Regarding: RE: coumain clearance      Contact: [phone]       Yes it is ok for him to stop 5 days before procedure.      Peter [place] MD, [facility]                  ----- Message -----         From: Karle Plumber, RN         Sent: 02/01/2012  11:00 AM           To: Peter M [place], MD, Charna Elizabeth, LPN      Subject: Leroy Sea clearance                                        Mr Bonn is scheduled for a Urology procedure with Dr Margarita Grizzle advised per Urology to be off coumadin for 5 days prior to procedure. Please advise regarding coumadin clearance.

## 2012-02-05 NOTE — Telephone Encounter (Signed)
This encounter was created in error - please disregard.

## 2012-02-06 ENCOUNTER — Encounter (HOSPITAL_COMMUNITY)
Admission: RE | Admit: 2012-02-06 | Discharge: 2012-02-06 | Disposition: A | Discharge status: Home / Self Care | Payer: Medicare Other | Source: Ambulatory Visit | Attending: Urology | Admitting: Urology

## 2012-02-06 ENCOUNTER — Encounter (HOSPITAL_COMMUNITY): Payer: Self-pay

## 2012-02-06 LAB — CBC
MCH: 30.8 pg (ref 26.0–34.0)
MCV: 90.2 fL (ref 78.0–100.0)
Platelets: 195 10*3/uL (ref 150–400)
RDW: 13.1 % (ref 11.5–15.5)
WBC: 6 10*3/uL (ref 4.0–10.5)

## 2012-02-06 LAB — BASIC METABOLIC PANEL
Calcium: 9.6 mg/dL (ref 8.4–10.5)
Creatinine, Ser: 1.56 mg/dL — ABNORMAL HIGH (ref 0.50–1.35)
GFR calc Af Amer: 49 mL/min — ABNORMAL LOW (ref >90)

## 2012-02-06 LAB — SURGICAL PCR SCREEN: MRSA, PCR: NEGATIVE

## 2012-02-06 NOTE — Patient Instructions (Signed)
YOUR SURGERY IS SCHEDULED ON:  WED 9/11  AT 8:30 AM  REPORT TO [place] SHORT STAY [facility] AT:  6:30 AM      PHONE # FOR SHORT STAY IS [phone]  DO NOT EAT OR DRINK ANYTHING AFTER MIDNIGHT THE NIGHT BEFORE YOUR SURGERY.  YOU MAY BRUSH YOUR TEETH, RINSE OUT YOUR MOUTH--BUT NO WATER, NO FOOD, NO CHEWING GUM, NO MINTS, NO CANDIES, NO CHEWING TOBACCO.  PLEASE TAKE THE FOLLOWING MEDICATIONS THE AM OF YOUR SURGERY WITH A FEW SIPS OF WATER:  COREG AND LANOXIN     DO NOT BRING VALUABLES, MONEY, CREDIT CARDS.  CONTACT LENS, DENTURES / PARTIALS, GLASSES SHOULD NOT BE WORN TO SURGERY AND IN MOST CASES-HEARING AIDS WILL NEED TO BE REMOVED.  BRING YOUR GLASSES CASE, ANY EQUIPMENT NEEDED FOR YOUR CONTACT LENS. FOR PATIENTS ADMITTED TO THE HOSPITAL--CHECK OUT TIME THE DAY OF DISCHARGE IS 11:00 AM.  ALL INPATIENT ROOMS ARE PRIVATE - WITH BATHROOM, TELEPHONE, TELEVISION AND WIFI INTERNET. IF YOU ARE BEING DISCHARGED THE SAME DAY OF YOUR SURGERY--YOU CAN NOT DRIVE YOURSELF HOME--AND SHOULD NOT GO HOME ALONE BY TAXI OR BUS.  NO DRIVING OR OPERATING MACHINERY FOR 24 HOURS FOLLOWING ANESTHESIA / PAIN MEDICATIONS.                            SPECIAL INSTRUCTIONS:  CHLORHEXIDINE SOAP SHOWER (other brand names are Betasept and Hibiclens ) PLEASE SHOWER WITH CHLORHEXIDINE THE NIGHT BEFORE YOUR SURGERY AND THE AM OF YOUR SURGERY. DO NOT USE CHLORHEXIDINE ON YOUR FACE OR PRIVATE AREAS--YOU MAY USE YOUR NORMAL SOAP THOSE AREAS AND YOUR NORMAL SHAMPOO.  WOMEN SHOULD AVOID SHAVING UNDER ARMS AND SHAVING LEGS 48 HOURS BEFORE USING CHLORHEXIDINE TO AVOID SKIN IRRITATION.  DO NOT USE IF ALLERGIC TO CHLORHEXIDINE.  PLEASE READ OVER ANY  FACT SHEETS THAT YOU WERE GIVEN: MRSA INFORMATION

## 2012-02-06 NOTE — Pre-Procedure Instructions (Signed)
PREOP CBC, BMET WERE DONE TODAY AT [facility] AS PER ANESTHESIOLOGIST'S GUIDELINES. PT ON COUMADIN --DR. JORDAN'S OFFICE CHECKED PT'S PT, INR ON 02/01/12 -THAT REPORT IN EPIC.  PT, INR WILL BE RECHECKED DAY OF SURGERY-PT STATES HIS LAST COUMADIN WILL BE Thursday 9/12--HE WAS INSTRUCTED BY DR. Elvis Coil OFFICE OK TO STOP COUMADIN 5 DAYS BEFORE HIS SURGERY SCHEDULED FOR 9/11. PT HAS EKG REPORT IN EPIC FROM 09/19/11 AND HE HAS A CARDIOLOGY OFFICE NOTE WITH CLEARANCE FOR SURGERY FROM DR. P. [place] DATED 01/03/12  --REPORT IN EPIC. PT HAS CXR REPORT IN EPIC FROM 08/02/11. PREOP INSTRUCTIONS DISCUSSED WITH PT AND HIS WIFE USING THE TEACH BACK METHOD.

## 2012-02-11 NOTE — Pre-Procedure Instructions (Signed)
PT'S PREOP CREATININE 1.56 -BMET REPORT WAS FAXED TO DR. BJYNWGNF'A OFFICE ON 02/06/12  -AND NOTE WAS RECEIVED BACK FROM DR. Margarita Grizzle THAT HE DID REVIEW THE PREOP LABS.

## 2012-02-12 NOTE — Anesthesia Preprocedure Evaluation (Addendum)
Anesthesia Evaluation  Patient identified by MRN, date of birth, ID band Patient awake    Reviewed: Allergy & Precautions, H&P , NPO status   Airway Mallampati: II TM Distance: >3 FB Neck ROM: Full    Dental  (+) Partial Upper and Partial Lower,    Pulmonary shortness of breath,  breath sounds clear to auscultation        Cardiovascular hypertension, Pt. on medications and Pt. on home beta blockers + CAD, + CABG and +CHF + dysrhythmias Atrial Fibrillation + Valvular Problems/Murmurs Rhythm:Irregular Rate:Normal     Neuro/Psych Hx Syncope; memory loss, peripheral neuropathy    GI/Hepatic Neg liver ROS, hiatal hernia,   Endo/Other  negative endocrine ROS  Renal/GU Renal InsufficiencyRenal disease     Musculoskeletal negative musculoskeletal ROS (+)   Abdominal   Peds  Hematology negative hematology ROS (+) Thrombocytopenia   Anesthesia Other Findings   Reproductive/Obstetrics                         Anesthesia Physical Anesthesia Plan  ASA: III  Anesthesia Plan: General   Post-op Pain Management:    Induction: Intravenous  Airway Management Planned: Oral ETT  Additional Equipment:   Intra-op Plan:   Post-operative Plan: Extubation in OR  Informed Consent: I have reviewed the patients History and Physical, chart, labs and discussed the procedure including the risks, benefits and alternatives for the proposed anesthesia with the patient or authorized representative who has indicated his/her understanding and acceptance.   Dental advisory given  Plan Discussed with: CRNA  Anesthesia Plan Comments:         Anesthesia Quick Evaluation

## 2012-02-13 ENCOUNTER — Encounter (HOSPITAL_COMMUNITY): Payer: Self-pay | Admitting: *Deleted

## 2012-02-13 ENCOUNTER — Ambulatory Visit (HOSPITAL_COMMUNITY): Payer: Medicare Other | Admitting: Anesthesiology

## 2012-02-13 ENCOUNTER — Encounter (HOSPITAL_COMMUNITY)
Admission: RE | Disposition: A | Discharge status: Home / Self Care | Payer: Self-pay | Source: Ambulatory Visit | Attending: Urology

## 2012-02-13 ENCOUNTER — Encounter (HOSPITAL_COMMUNITY): Payer: Self-pay | Admitting: Anesthesiology

## 2012-02-13 ENCOUNTER — Ambulatory Visit (HOSPITAL_COMMUNITY)
Admission: RE | Admit: 2012-02-13 | Discharge: 2012-02-14 | Disposition: A | Discharge status: Home / Self Care | Payer: Medicare Other | Source: Ambulatory Visit | Attending: Urology | Admitting: Urology

## 2012-02-13 DIAGNOSIS — Z79899 Other long term (current) drug therapy: Secondary | ICD-10-CM | POA: Insufficient documentation

## 2012-02-13 DIAGNOSIS — N138 Other obstructive and reflux uropathy: Secondary | ICD-10-CM | POA: Insufficient documentation

## 2012-02-13 DIAGNOSIS — Z7901 Long term (current) use of anticoagulants: Secondary | ICD-10-CM | POA: Insufficient documentation

## 2012-02-13 DIAGNOSIS — I129 Hypertensive chronic kidney disease with stage 1 through stage 4 chronic kidney disease, or unspecified chronic kidney disease: Secondary | ICD-10-CM | POA: Insufficient documentation

## 2012-02-13 DIAGNOSIS — Z01812 Encounter for preprocedural laboratory examination: Secondary | ICD-10-CM | POA: Insufficient documentation

## 2012-02-13 DIAGNOSIS — N400 Enlarged prostate without lower urinary tract symptoms: Secondary | ICD-10-CM

## 2012-02-13 DIAGNOSIS — I4891 Unspecified atrial fibrillation: Secondary | ICD-10-CM | POA: Insufficient documentation

## 2012-02-13 DIAGNOSIS — Z951 Presence of aortocoronary bypass graft: Secondary | ICD-10-CM | POA: Insufficient documentation

## 2012-02-13 DIAGNOSIS — N189 Chronic kidney disease, unspecified: Secondary | ICD-10-CM | POA: Insufficient documentation

## 2012-02-13 DIAGNOSIS — R339 Retention of urine, unspecified: Secondary | ICD-10-CM | POA: Insufficient documentation

## 2012-02-13 DIAGNOSIS — N401 Enlarged prostate with lower urinary tract symptoms: Secondary | ICD-10-CM | POA: Insufficient documentation

## 2012-02-13 DIAGNOSIS — I2510 Atherosclerotic heart disease of native coronary artery without angina pectoris: Secondary | ICD-10-CM | POA: Insufficient documentation

## 2012-02-13 HISTORY — PX: TRANSURETHRAL RESECTION OF PROSTATE: SHX73

## 2012-02-13 HISTORY — PX: CYSTOSCOPY: SHX5120

## 2012-02-13 LAB — BASIC METABOLIC PANEL
CO2: 27 mEq/L (ref 19–32)
Chloride: 103 mEq/L (ref 96–112)
Glucose, Bld: 153 mg/dL — ABNORMAL HIGH (ref 70–99)
Potassium: 4 mEq/L (ref 3.5–5.1)
Sodium: 139 mEq/L (ref 135–145)

## 2012-02-13 SURGERY — TRANSURETHRAL RESECTION OF THE PROSTATE WITH GYRUS INSTRUMENTS
Anesthesia: General | Wound class: Clean Contaminated

## 2012-02-13 MED ORDER — ONDANSETRON HCL 4 MG/2ML IJ SOLN: 4.0000 mg | INTRAMUSCULAR | Status: DC | PRN

## 2012-02-13 MED ORDER — FUROSEMIDE 20 MG PO TABS
20.0000 mg | ORAL_TABLET | Freq: Every day | ORAL | Status: DC
  Administered 2012-02-13 – 2012-02-14 (×2): 20 mg via ORAL
  Filled 2012-02-13 (×2): qty 1

## 2012-02-13 MED ORDER — PROPOFOL 10 MG/ML IV BOLUS
INTRAVENOUS | Status: DC | PRN
  Administered 2012-02-13: 140 mg via INTRAVENOUS

## 2012-02-13 MED ORDER — BELLADONNA ALKALOIDS-OPIUM 16.2-60 MG RE SUPP
RECTAL | Status: AC
  Filled 2012-02-13: qty 1

## 2012-02-13 MED ORDER — TAMSULOSIN HCL 0.4 MG PO CAPS
0.4000 mg | ORAL_CAPSULE | Freq: Every day | ORAL | Status: DC
  Administered 2012-02-13 – 2012-02-14 (×2): 0.4 mg via ORAL
  Filled 2012-02-13 (×2): qty 1

## 2012-02-13 MED ORDER — ACETAMINOPHEN 10 MG/ML IV SOLN
1000.0000 mg | Freq: Four times a day (QID) | INTRAVENOUS | Status: AC
  Administered 2012-02-13 – 2012-02-14 (×3): 1000 mg via INTRAVENOUS
  Filled 2012-02-13 (×4): qty 100

## 2012-02-13 MED ORDER — OXYCODONE HCL 5 MG PO TABS
5.0000 mg | ORAL_TABLET | ORAL | Status: DC | PRN
  Administered 2012-02-14: 10 mg via ORAL
  Filled 2012-02-13: qty 2

## 2012-02-13 MED ORDER — SODIUM CHLORIDE 0.9 % IR SOLN
Status: DC | PRN
  Administered 2012-02-13: 45000 mL

## 2012-02-13 MED ORDER — LACTATED RINGERS IV SOLN
INTRAVENOUS | Status: DC | PRN
  Administered 2012-02-13 (×2): via INTRAVENOUS

## 2012-02-13 MED ORDER — INDIGOTINDISULFONATE SODIUM 8 MG/ML IJ SOLN
INTRAMUSCULAR | Status: AC
  Filled 2012-02-13: qty 5

## 2012-02-13 MED ORDER — BELLADONNA ALKALOIDS-OPIUM 16.2-60 MG RE SUPP: 1.0000 | Freq: Four times a day (QID) | RECTAL | Status: DC | PRN

## 2012-02-13 MED ORDER — DIGOXIN 250 MCG PO TABS
0.2500 mg | ORAL_TABLET | Freq: Every day | ORAL | Status: DC
  Administered 2012-02-14: 0.25 mg via ORAL
  Filled 2012-02-13 (×2): qty 1

## 2012-02-13 MED ORDER — LACTATED RINGERS IV SOLN: INTRAVENOUS | Status: DC

## 2012-02-13 MED ORDER — FENTANYL CITRATE 0.05 MG/ML IJ SOLN
INTRAMUSCULAR | Status: DC | PRN
  Administered 2012-02-13: 50 ug via INTRAVENOUS
  Administered 2012-02-13 (×6): 25 ug via INTRAVENOUS
  Administered 2012-02-13: 50 ug via INTRAVENOUS

## 2012-02-13 MED ORDER — MORPHINE SULFATE 2 MG/ML IJ SOLN: 2.0000 mg | INTRAMUSCULAR | Status: DC | PRN

## 2012-02-13 MED ORDER — HYDROMORPHONE HCL PF 1 MG/ML IJ SOLN
INTRAMUSCULAR | Status: AC
  Filled 2012-02-13: qty 1

## 2012-02-13 MED ORDER — DONEPEZIL HCL 10 MG PO TABS
10.0000 mg | ORAL_TABLET | Freq: Every day | ORAL | Status: DC
  Administered 2012-02-13: 10 mg via ORAL
  Filled 2012-02-13 (×2): qty 1

## 2012-02-13 MED ORDER — DUTASTERIDE 0.5 MG PO CAPS
0.5000 mg | ORAL_CAPSULE | Freq: Every day | ORAL | Status: DC
  Administered 2012-02-13 – 2012-02-14 (×2): 0.5 mg via ORAL
  Filled 2012-02-13 (×2): qty 1

## 2012-02-13 MED ORDER — 0.9 % SODIUM CHLORIDE (POUR BTL) OPTIME
TOPICAL | Status: DC | PRN
  Administered 2012-02-13: 1000 mL

## 2012-02-13 MED ORDER — VITAMIN B-12 1000 MCG PO TABS
1000.0000 ug | ORAL_TABLET | Freq: Every day | ORAL | Status: DC
  Administered 2012-02-13 – 2012-02-14 (×2): 1000 ug via ORAL
  Filled 2012-02-13 (×2): qty 1

## 2012-02-13 MED ORDER — INDIGOTINDISULFONATE SODIUM 8 MG/ML IJ SOLN
INTRAMUSCULAR | Status: DC | PRN
  Administered 2012-02-13: 5 mL via INTRAVENOUS

## 2012-02-13 MED ORDER — LIDOCAINE HCL 2 % EX GEL
CUTANEOUS | Status: AC
  Filled 2012-02-13: qty 10

## 2012-02-13 MED ORDER — DEXTROSE 5 % IV SOLN
1.0000 g | Freq: Once | INTRAVENOUS | Status: AC
  Administered 2012-02-13: 1 g via INTRAVENOUS
  Filled 2012-02-13: qty 10

## 2012-02-13 MED ORDER — SODIUM CHLORIDE 0.9 % IV SOLN
INTRAVENOUS | Status: DC
  Administered 2012-02-13 (×2): via INTRAVENOUS

## 2012-02-13 MED ORDER — ACETAMINOPHEN 325 MG PO TABS: 650.0000 mg | ORAL_TABLET | ORAL | Status: DC | PRN

## 2012-02-13 MED ORDER — ONDANSETRON HCL 4 MG/2ML IJ SOLN
INTRAMUSCULAR | Status: DC | PRN
  Administered 2012-02-13: 4 mg via INTRAVENOUS

## 2012-02-13 MED ORDER — SENNOSIDES-DOCUSATE SODIUM 8.6-50 MG PO TABS
1.0000 | ORAL_TABLET | Freq: Two times a day (BID) | ORAL | Status: DC
  Administered 2012-02-13 – 2012-02-14 (×3): 1 via ORAL
  Filled 2012-02-13 (×4): qty 1

## 2012-02-13 MED ORDER — PROMETHAZINE HCL 25 MG/ML IJ SOLN: 6.2500 mg | INTRAMUSCULAR | Status: DC | PRN

## 2012-02-13 MED ORDER — POTASSIUM CHLORIDE CRYS ER 10 MEQ PO TBCR
10.0000 meq | EXTENDED_RELEASE_TABLET | Freq: Four times a day (QID) | ORAL | Status: DC
  Administered 2012-02-13 – 2012-02-14 (×3): 10 meq via ORAL
  Filled 2012-02-13 (×7): qty 1

## 2012-02-13 MED ORDER — OXYBUTYNIN CHLORIDE 5 MG PO TABS
5.0000 mg | ORAL_TABLET | Freq: Four times a day (QID) | ORAL | Status: DC | PRN
  Administered 2012-02-13 – 2012-02-14 (×2): 5 mg via ORAL
  Filled 2012-02-13 (×3): qty 1

## 2012-02-13 MED ORDER — HYDROMORPHONE HCL PF 1 MG/ML IJ SOLN
0.2500 mg | INTRAMUSCULAR | Status: DC | PRN
  Administered 2012-02-13: 0.5 mg via INTRAVENOUS

## 2012-02-13 MED ORDER — LISINOPRIL 20 MG PO TABS
20.0000 mg | ORAL_TABLET | Freq: Every day | ORAL | Status: DC
  Administered 2012-02-14: 20 mg via ORAL
  Filled 2012-02-13 (×2): qty 1

## 2012-02-13 MED ORDER — ATORVASTATIN CALCIUM 40 MG PO TABS
40.0000 mg | ORAL_TABLET | Freq: Every day | ORAL | Status: DC
  Administered 2012-02-14: 40 mg via ORAL
  Filled 2012-02-13 (×2): qty 1

## 2012-02-13 MED ORDER — CARVEDILOL 12.5 MG PO TABS
12.5000 mg | ORAL_TABLET | Freq: Two times a day (BID) | ORAL | Status: DC
  Administered 2012-02-13 – 2012-02-14 (×2): 12.5 mg via ORAL
  Filled 2012-02-13 (×4): qty 1

## 2012-02-13 MED ORDER — CEFTRIAXONE SODIUM 1 G IJ SOLR
1.0000 g | INTRAMUSCULAR | Status: DC
  Administered 2012-02-14: 1 g via INTRAVENOUS
  Filled 2012-02-13: qty 10

## 2012-02-13 MED ORDER — SODIUM CHLORIDE 0.9 % IR SOLN
3000.0000 mL | Status: DC
  Administered 2012-02-13 – 2012-02-14 (×7): 3000 mL

## 2012-02-13 MED ORDER — STERILE WATER FOR IRRIGATION IR SOLN
Status: DC | PRN
  Administered 2012-02-13: 500 mL

## 2012-02-13 MED ORDER — BELLADONNA ALKALOIDS-OPIUM 16.2-60 MG RE SUPP
RECTAL | Status: DC | PRN
  Administered 2012-02-13: 1 via RECTAL

## 2012-02-13 MED ORDER — BACITRACIN-NEOMYCIN-POLYMYXIN 400-5-5000 EX OINT: 1.0000 "application " | TOPICAL_OINTMENT | Freq: Three times a day (TID) | CUTANEOUS | Status: DC | PRN

## 2012-02-13 SURGICAL SUPPLY — 30 items
BAG URINE DRAINAGE (UROLOGICAL SUPPLIES) ×2 IMPLANT
BAG URO CATCHER STRL LF (DRAPE) ×2 IMPLANT
BLADE SURG 15 STRL LF DISP TIS (BLADE) IMPLANT
BLADE SURG 15 STRL SS (BLADE)
CATH COUDE 24FR 5CC (CATHETERS) IMPLANT
CATH HEMA 3WAY 30CC 22FR COUDE (CATHETERS) ×2 IMPLANT
CATH RIBBED COUDE  30CC (CATHETERS)
CLOTH BEACON ORANGE TIMEOUT ST (SAFETY) ×2 IMPLANT
DRAPE CAMERA CLOSED 9X96 (DRAPES) ×2 IMPLANT
ELECT BUTTON HF 24-28F 2 30DE (ELECTRODE) ×2 IMPLANT
ELECT LOOP MED HF 24F 12D (CUTTING LOOP) ×2 IMPLANT
ELECT REM PT RETURN 9FT ADLT (ELECTROSURGICAL)
ELECT RESECT VAPORIZE 12D CBL (ELECTRODE) IMPLANT
ELECTRODE REM PT RTRN 9FT ADLT (ELECTROSURGICAL) IMPLANT
GLOVE BIOGEL M 7.0 STRL (GLOVE) ×2 IMPLANT
GOWN PREVENTION PLUS XLARGE (GOWN DISPOSABLE) ×2 IMPLANT
GOWN STRL NON-REIN LRG LVL3 (GOWN DISPOSABLE) ×2 IMPLANT
GUIDEWIRE STR DUAL SENSOR (WIRE) ×2 IMPLANT
HOLDER FOLEY CATH W/STRAP (MISCELLANEOUS) IMPLANT
KIT ASPIRATION TUBING (SET/KITS/TRAYS/PACK) ×2 IMPLANT
KIT SUPRAPUBIC CATH (MISCELLANEOUS) IMPLANT
MANIFOLD NEPTUNE II (INSTRUMENTS) ×2 IMPLANT
NEEDLE SPNL 18GX3.5 QUINCKE PK (NEEDLE) IMPLANT
NS IRRIG 1000ML POUR BTL (IV SOLUTION) ×2 IMPLANT
PACK CYSTO (CUSTOM PROCEDURE TRAY) ×2 IMPLANT
SCRUB PCMX 4 OZ (MISCELLANEOUS) ×2 IMPLANT
SUT ETHILON 3 0 PS 1 (SUTURE) IMPLANT
SYR 30ML LL (SYRINGE) ×2 IMPLANT
SYRINGE IRR TOOMEY STRL 70CC (SYRINGE) ×2 IMPLANT
TUBING CONNECTING 10 (TUBING) ×2 IMPLANT

## 2012-02-13 NOTE — Transfer of Care (Signed)
Immediate Anesthesia Transfer of Care Note  Patient: Troy Gregory  Procedure(s) Performed: Procedure(s) (LRB) with comments: TRANSURETHRAL RESECTION OF THE PROSTATE WITH GYRUS INSTRUMENTS (N/A) CYSTOSCOPY (N/A)  Patient Location: PACU  Anesthesia Type: General  Level of Consciousness: awake and patient cooperative  Airway & Oxygen Therapy: Patient Spontanous Breathing and Patient connected to face mask oxygen  Post-op Assessment: Report given to PACU RN and Post -op Vital signs reviewed and stable  Post vital signs: Reviewed and stable  Complications: No apparent anesthesia complications

## 2012-02-13 NOTE — Progress Notes (Signed)
Dr. Margarita Grizzle aware of patient's HGB. And HCT. And B met results.

## 2012-02-13 NOTE — Progress Notes (Signed)
HGB. AND HCT. AND B MET DRAWN BY LAB 

## 2012-02-13 NOTE — Preoperative (Signed)
Beta Blockers   Reason not to administer Beta Blockers:Not Applicable 

## 2012-02-13 NOTE — H&P (Signed)
Urology History and Physical Exam  CC: BPH, urinary retention  HPI: 76 year old male presents today for transurethral resection of the prostate. He has experienced urinary retention following a previous cardiac surgery. He has failed multiple voiding trials and has experienced urosepsis due to his retention. He had urodynamics which revealed a noncompliant, hypersensitive, unstable bladder. Uroflow reviewed a max flow rate of 10 cc per second. Cystoscopy revealed bilateral orthotopic ureteral orifices with an intravesical median lobe. His prostate volume measured 105 cc on transrectal ultrasound. PSA was 3.19 in May 2013. He has a mild cognitive impairment which is likely early dementia. I have discussed with he and his wife this procedure and other options multiple times. We have fully reviewed the risks, benefits, alternatives, and likelihood of achieving goals. He has been cleared by his cardiologist, Dr. [place], to be off warfarin for this procedure. They understand that due to the size of his prostate he may need a staged TURP. Preoperative urine culture revealed Enterobacter sensitive to Rocephin and ciprofloxacin. He was started on ciprofloxacin 02/08/12 and will receive Rocephin this morning for preoperative antibiotic.   PMH: Past Medical History  Diagnosis Date  . Hypertension   . Atrial fibrillation 06/2011    S/p left sided MAZE. On Coumadin, January, 2013  . Syncope     Near-syncope, etiology is deemed secondary to the excessive heat  . Benign prostatic hypertrophy     but stable  . Chronic anticoagulation   . Coronary artery disease 06/2011    S/p LIMA-LAD, SVG-intermediate coronary (?), and SVG-distal RCA with concurrent MV repair and MAZE  . Mitral regurgitation 06/2011    Mitral valve ring annuloplasty at the time of CABG,, January, 2013  . Shortness of breath   . Chronic kidney disease 12/2010    with a creatinine on discharge of 1.84, was  2.1 on admission  . Calculus of kidney    . H/O hiatal hernia   . Ejection fraction < 50%     EF previously 50%, however 20-25% by echo and catheterization before CABG  . Chronic kidney disease   . Foley catheter in place     PSH: Past Surgical History  Procedure Date  . Circumcision 08/12/2002    because of phimosis  . Maze 06/06/2011    Procedure: MAZE;  Surgeon: [name], MD;  Location: [facility] OR;  Service: Open Heart Surgery;  Laterality: N/A;  . Mitral valve repair 06/06/2011    Procedure: MITRAL VALVE REPAIR (MVR);  Surgeon: [name], MD;  Location: [facility] OR;  Service: Open Heart Surgery;  Laterality: N/A;  . Coronary artery bypass graft 06/06/2011     grafts times three using left internal mammary artery and right leg greater saphenous vein harvested endoscopically  . Saturation biopsy of prostate   . Lithotripsy     Allergies: No Known Allergies  Medications: Prescriptions prior to admission  Medication Sig Dispense Refill  . atorvastatin (LIPITOR) 40 MG tablet Take 40 mg by mouth daily after breakfast.      . carvedilol (COREG) 12.5 MG tablet Take 12.5 mg by mouth 2 (two) times daily with a meal.      . digoxin (LANOXIN) 0.25 MG tablet Take 0.25 mg by mouth daily after breakfast.      . dutasteride (AVODART) 0.5 MG capsule Take 0.5 mg by mouth daily. EVERY AM      . furosemide (LASIX) 40 MG tablet Take 20 mg by mouth daily. Takes 1/2 tablet      .  lisinopril (PRINIVIL,ZESTRIL) 20 MG tablet Take 20 mg by mouth daily after breakfast.      . Omega-3 Fatty Acids (FISH OIL) 1200 MG CAPS Take 1 capsule by mouth 2 (two) times daily.      Marland Kitchen oxybutynin (DITROPAN) 5 MG tablet Take 5 mg by mouth as needed. Bladder spasm      . potassium chloride (K-DUR,KLOR-CON) 10 MEQ tablet Take 10 mEq by mouth 4 (four) times daily.      . Tamsulosin HCl (FLOMAX) 0.4 MG CAPS Take 0.4 mg by mouth daily.      . vitamin B-12 (CYANOCOBALAMIN) 1000 MCG tablet Take 1,000 mcg by mouth daily.      Marland Kitchen warfarin (COUMADIN) 3 MG tablet Take  3-6 mg by mouth daily. Takes 3 mg every day except Friday he takes 6 mg      . donepezil (ARICEPT) 10 MG tablet Take 10 mg by mouth at bedtime.         Social History: History   Social History  . Marital Status: Married    Spouse Name: N/A    Number of Children: N/A  . Years of Education: N/A   Occupational History  . Not on file.   Social History Main Topics  . Smoking status: Former Smoker -- 45 years    Types: Cigarettes    Quit date: 07/01/1964  . Smokeless tobacco: Former Neurosurgeon    Types: Chew  . Alcohol Use: No  . Drug Use: No  . Sexually Active: No   Other Topics Concern  . Not on file   Social History Narrative   Has a wife Eutha [phone] and [phone]. Currently at rehab [facility] after cardiac surgery 06/2011.     Family History: Family History  Problem Relation Age of Onset  . Cancer Father   . Lung cancer Father   . Diabetes Father   . Hypertension Mother   . Hypertension Brother     Review of Systems: Positive: None Negative: Chest pain, SOB, fever.  A further 10 point review of systems was negative except what is listed in the HPI.  Physical Exam:  General: No acute distress.  Awake. Head:  Normocephalic.  Atraumatic. ENT:  EOMI.  Mucous membranes moist Neck:  Supple.  No lymphadenopathy. CV:  Irregularly irregular. Pulmonary: Equal effort bilaterally.  Clear to auscultation bilaterally. Abdomen: Soft.  Non- tender to palpation. Skin:  Normal turgor.  No visible rash. Extremity: No gross deformity of bilateral upper extremities.  No gross deformity of    bilateral lower extremities. Neurologic: Alert. Appropriate mood.   Studies:  No results found for this basename: HGB:2,WBC:2,PLT:2 in the last 72 hours  No results found for this basename: NA:2,K:2,CL:2,CO2:2,BUN:2,CREATININE:2,CALCIUM:2,MAGNESIUM:2,GFRNONAA:2,GFRAA:2 in the last 72 hours   Recent Labs  [facility] 02/13/12 0640   INR 1.05   APTT --     No components found with this  basename: ABG:2    Assessment:  Urinary retention, BPH  Plan: -To OR for TURP

## 2012-02-13 NOTE — Anesthesia Postprocedure Evaluation (Signed)
Anesthesia Post Note  Patient: Troy Gregory  Procedure(s) Performed: Procedure(s) (LRB): TRANSURETHRAL RESECTION OF THE PROSTATE WITH GYRUS INSTRUMENTS (N/A) CYSTOSCOPY (N/A)  Anesthesia type: General  Patient location: PACU  Post pain: Pain level controlled  Post assessment: Post-op Vital signs reviewed  Last Vitals:  Filed Vitals:   02/13/12 1237  BP: 173/85  Pulse: 50  Temp:   Resp: 13    Post vital signs: Reviewed  Level of consciousness: sedated  Complications: No apparent anesthesia complications

## 2012-02-13 NOTE — Progress Notes (Signed)
Dr. Fortune made aware of patient's blood pressures in PACU. 

## 2012-02-13 NOTE — Brief Op Note (Signed)
02/13/2012  10:59 AM  PATIENT:  Troy Gregory  76 y.o. male  PRE-OPERATIVE DIAGNOSIS:  BENIGN PROSTATIC HYPERTROPHY,URINARY RETENTION  POST-OPERATIVE DIAGNOSIS:  BENIGN PROSTATIC HYPERTROPHY,URINARY RETENTION  PROCEDURE:  Procedure(s) (LRB) with comments: TRANSURETHRAL RESECTION OF THE PROSTATE WITH GYRUS INSTRUMENTS (N/A) CYSTOSCOPY (N/A)  SURGEON:  Surgeon(s) and Role:    * Milford Cage, MD - Primary  PHYSICIAN ASSISTANT:   ASSISTANTS: none   ANESTHESIA:   general  EBL:  Total I/O In: 1900 [I.V.:1900] Out: - 100 cc  BLOOD ADMINISTERED:none  DRAINS: Urinary Catheter (Foley)   LOCAL MEDICATIONS USED:  OTHER B&O suppository  SPECIMEN:  Source of Specimen:  Prostate chips.  DISPOSITION OF SPECIMEN:  PATHOLOGY  COUNTS:  YES  TOURNIQUET:  * No tourniquets in log *  DICTATION: .Other Dictation: Dictation Number [id number]  PLAN OF CARE: Admit for overnight observation  PATIENT DISPOSITION:  PACU - hemodynamically stable.   Delay start of Pharmacological VTE agent (>24hrs) due to surgical blood loss or risk of bleeding: yes

## 2012-02-14 MED ORDER — SENNOSIDES-DOCUSATE SODIUM 8.6-50 MG PO TABS: 1.0000 | ORAL_TABLET | Freq: Two times a day (BID) | ORAL | Status: DC

## 2012-02-14 MED ORDER — BACITRACIN-NEOMYCIN-POLYMYXIN 400-5-5000 EX OINT: 1.0000 "application " | TOPICAL_OINTMENT | Freq: Three times a day (TID) | CUTANEOUS | Status: DC

## 2012-02-14 MED ORDER — OXYCODONE HCL 5 MG PO TABS: 5.0000 mg | ORAL_TABLET | ORAL | Status: DC | PRN

## 2012-02-14 MED ORDER — OXYBUTYNIN CHLORIDE 5 MG PO TABS: 5.0000 mg | ORAL_TABLET | Freq: Four times a day (QID) | ORAL | Status: DC | PRN

## 2012-02-14 MED ORDER — CIPROFLOXACIN HCL 500 MG PO TABS: 250.0000 mg | ORAL_TABLET | Freq: Two times a day (BID) | ORAL | Status: DC

## 2012-02-14 NOTE — Discharge Summary (Signed)
Physician Discharge Summary  Patient ID: Troy Gregory MRN: [id number] DOB/AGE: 1935-11-21 76 y.o.  Admit date: 02/13/2012 Discharge date: 02/14/2012  Admission Diagnoses: BPH, urinary retention.  Discharge Diagnoses:  BPH, urinary retention.  Discharged Condition: good  Hospital Course:  This patient was admitted following transurethral resection of the prostate for continuous bladder irrigation (CBI). He was able to tolerate a liquid diet and passing flatus. His CBI was turned off early in the morning on postoperative day one and remained clear enough for discharge home. He was ambulated with no problems and was able to tolerate a regular diet. It was felt that he could be discharged home and he and his wife agreed. He will be discharged home on prophylactic antibiotic of ciprofloxacin. He will return to clinic next week for a voiding trial. If there is any question of whether or not he is able to pass this trial he should have his catheter replaced. It should be noted that catheter replacement should be with a coud-tipped catheter as there was a mild undermining of the bladder neck with his TURP.  If he passes his voiding trial he should follow up with me in 3 months from that time.  Consults: None  Significant Diagnostic Studies: None  Treatments: surgery: Transurethral resection of the prostate.  Discharge Exam: Blood pressure 123/68, pulse 54, temperature 97.7 F (36.5 C), temperature source Oral, resp. rate 16, height 6' (1.829 m), weight 79.379 kg (175 lb), SpO2 98.00%. See PE from progress note on date of discharge.  Disposition: 01-Home or Self Care  Discharge Orders    Future Appointments: Provider: Department: Dept Phone: Center:   02/22/2012 10:00 AM [facility] [phone] None       Medication List     As of 02/14/2012  7:08 AM    STOP taking these medications         Fish Oil 1200 MG Caps      warfarin 3 MG tablet   Commonly known as: COUMADIN      TAKE these medications         atorvastatin 40 MG tablet   Commonly known as: LIPITOR   Take 40 mg by mouth daily after breakfast.      carvedilol 12.5 MG tablet   Commonly known as: COREG   Take 12.5 mg by mouth 2 (two) times daily with a meal.      ciprofloxacin 500 MG tablet   Commonly known as: CIPRO   Take 0.5 tablets (250 mg total) by mouth 2 (two) times daily.      digoxin 0.25 MG tablet   Commonly known as: LANOXIN   Take 0.25 mg by mouth daily after breakfast.      donepezil 10 MG tablet   Commonly known as: ARICEPT   Take 10 mg by mouth at bedtime.      dutasteride 0.5 MG capsule   Commonly known as: AVODART   Take 0.5 mg by mouth daily. EVERY AM      furosemide 40 MG tablet   Commonly known as: LASIX   Take 20 mg by mouth daily. Takes 1/2 tablet      lisinopril 20 MG tablet   Commonly known as: PRINIVIL,ZESTRIL   Take 20 mg by mouth daily after breakfast.      neomycin-bacitracin-polymyxin ointment   Commonly known as: NEOSPORIN   Apply 1 application topically 3 (three) times daily. apply to tip of penis      oxybutynin 5 MG  tablet   Commonly known as: DITROPAN   Take 5 mg by mouth as needed. Bladder spasm      oxybutynin 5 MG tablet   Commonly known as: DITROPAN   Take 1 tablet (5 mg total) by mouth every 6 (six) hours as needed (bladder spasms).      oxyCODONE 5 MG immediate release tablet   Commonly known as: Oxy IR/ROXICODONE   Take 1-2 tablets (5-10 mg total) by mouth every 4 (four) hours as needed for pain.      potassium chloride 10 MEQ tablet   Commonly known as: K-DUR,KLOR-CON   Take 10 mEq by mouth 4 (four) times daily.      senna-docusate 8.6-50 MG per tablet   Commonly known as: Senokot-S   Take 1 tablet by mouth 2 (two) times daily.      Tamsulosin HCl 0.4 MG Caps   Commonly known as: FLOMAX   Take 0.4 mg by mouth daily.      vitamin B-12 1000 MCG tablet   Commonly known as: CYANOCOBALAMIN    Take 1,000 mcg by mouth daily.           Follow-up Information    Follow up with Saunders Revel, PA. On 02/21/2012. (2:30 pm)    Contact information:   [address] [facility] [place] [address] [phone]          Signed: Milford Cage 02/14/2012, 7:08 AM

## 2012-02-14 NOTE — Progress Notes (Signed)
Urology Progress Note  Subjective:     No acute urologic events overnight. Positive bladder spasms. Positive flatus. Tolerating clear liquids.  CBI turned off at 05:00 and urine remains a clear red without clot.   ROS: Negative: Chest pain  Objective:  Patient Vitals for the past 24 hrs:  BP Temp Temp src Pulse Resp SpO2 Height Weight  02/14/12 0636 123/68 mmHg 97.7 F (36.5 C) Oral 54  16  98 % - -  02/14/12 0059 117/67 mmHg 98.8 F (37.1 C) Oral 62  16  95 % - -  02/13/12 2050 155/76 mmHg 99 F (37.2 C) Oral 66  20  97 % - -  02/13/12 1900 156/95 mmHg 98 F (36.7 C) Oral 63  16  100 % - -  02/13/12 1445 - - - - - - 6' (1.829 m) 79.379 kg (175 lb)  02/13/12 1434 168/78 mmHg 98.2 F (36.8 C) - 59  12  100 % - -  02/13/12 1400 174/87 mmHg 98 F (36.7 C) - 55  12  100 % - -  02/13/12 1345 166/83 mmHg - - 54  12  100 % - -  02/13/12 1330 186/91 mmHg - - 55  15  100 % - -  02/13/12 1320 - - - 52  11  100 % - -  02/13/12 1315 168/96 mmHg - - 53  12  100 % - -  02/13/12 1300 169/83 mmHg 97.2 F (36.2 C) - 52  14  100 % - -  02/13/12 1245 175/92 mmHg - - 51  13  100 % - -  02/13/12 1237 173/85 mmHg - - 50  13  100 % - -  02/13/12 1230 187/83 mmHg - - 52  15  100 % - -  02/13/12 1215 184/98 mmHg - - 51  18  100 % - -  02/13/12 1200 191/94 mmHg 97.7 F (36.5 C) - 52  16  100 % - -  02/13/12 1145 197/90 mmHg - - 54  15  99 % - -  02/13/12 1130 179/89 mmHg - - 54  13  100 % - -  02/13/12 1115 144/92 mmHg - - 52  14  100 % - -  02/13/12 1108 157/77 mmHg 97.4 F (36.3 C) - 51  12  100 % - -    Physical Exam: General:  No acute distress, awake Cardiovascular:    []   S1/S2 present, RRR  [x]   Irregularly irregular Chest:  CTA-B Abdomen:               []  Soft, appropriately TTP  [x]  Soft, NTTP  []  Soft, appropriately TTP, incision(s) clean/dry/intact  Genitourinary: Foley in place. Foley:  Draining clear red urine without clot.    I/O last 3 completed shifts: In: 19147  [P.O.:240; I.V.:2200; Other:10000] Out: 82956 [Urine:11475]  Recent Labs  Basename 02/13/12 1135   HGB 13.4   WBC --   PLT --    Recent Labs  [facility] 02/13/12 1135   NA 139   K 4.0   CL 103   CO2 27   BUN 15   CREATININE 1.72*   CALCIUM 8.6   GFRNONAA 38*   GFRAA 44*     Recent Labs  Basename 02/13/12 0640   INR 1.05   APTT --     No components found with this basename: ABG:2    Length of stay: 1 days.  Assessment: Urinary retention. BPH. POD#1 TURP.  Plan: -Saline lock IV -Advance diet -Ambulate -Likely discharge home today   Natalia Leatherwood, MD [phone]

## 2012-02-14 NOTE — Op Note (Signed)
NAMEJOHNPATRICK, JENNY NO.:  [id number]  MEDICAL RECORD NO.:  [id number]  LOCATION:  1433                         FACILITY:  [facility]  PHYSICIAN:  Natalia Leatherwood, MD    DATE OF BIRTH:  1935/09/21  DATE OF PROCEDURE:  02/13/2012 DATE OF DISCHARGE:                              OPERATIVE REPORT   SURGEON:  Natalia Leatherwood, MD  ASSISTANT:  None.  PREOPERATIVE DIAGNOSES:  Urinary retention and benign prostatic hypertrophy.  POSTOPERATIVE DIAGNOSIS:  Urinary retention and benign prostatic hypertrophy.  PROCEDURE PERFORMED:  Transurethral resection of prostate and cystoscopy.  COMPLICATIONS:  None.  FINDINGS:  Large obstructing prostate.  SPECIMEN:  Prostate tissue sent for permanent pathology.  DRAINS:  Foley catheter.  ESTIMATED BLOOD LOSS:  100 mL.  HISTORY OF PRESENT ILLNESS:  This is a 76 year old gentleman who went into urinary retention following cardiac surgery.  He failed multiple voiding trials and developed urosepsis due to his urinary retention.  He had urodynamics, which showed a small capacity bladder, which was hyperactive.  It also showed obstruction from his prostate.  After extensive counseling and workup, he and his wife have decided to proceed with TURP.  They were counseled that because of the size of his prostate which was over 100 g, that he would likely need a staged procedure.  He presents today for that procedure.  PROCEDURE:  After informed was obtained, the patient was taken to the operating room where he was placed in the supine position.  IV antibiotics were infused and general anesthesia was induced.  SCDs were in place and turned on prior to the induction of anesthesia.  He was placed in the dorsal lithotomy position making sure to pad all pertinent neurovascular pressure points appropriately.  His genitals were prepped and draped in usual fashion.  A time-out was performed, which the correct patient, surgical site, and  procedure were identified and agreed upon by the team.    Following this, the rigid cystoscope was advanced through the urethra into bladder.  The ureteral orifices were identified bilaterally.  The bladder was evaluated in a systematic fashion, and there were no bladder tumors noted. Next, the visual obturator to the gyrus resectoscope was placed and the loop device was used to mark around where the ureteral orifices were located bilaterally to help identify them during resection.  After this was done, a trough was made at the 6 o'clock position carrying it from the bladder neck back to the proximal portion of the verumontanum.  A trough was made on either side of the verumontanum and all resection was carried out proximal to the sphincter.  After this resection was carried out with the loop device and with the button electrode.  Resection was carried down along the bladder neck and down to the capsule on the left side.  This tissue was removed.  I did leave anterior tissue to help prevent any bladder neck contraction from occurring.  Due to the large size of the prostate, it took 90 minutes to resect a good channel at the 6 o'clock position and down to the capsule and the entire left side of the prostatic lobe. Because of this  amount of time, I did not feel that it had time to safely perform the right side of the procedure.  All prostate chips were irrigated and removed from the bladder and sent for permanent pathology. Hemostasis was maintained with electrocautery.  There was good hemostasis at the end of the case.  It was noted that end of the case, there was a very small undermining of the bladder at the 6 o'clock position.  This is due to the patient having a very high riding bladder neck, but not a lot of prostate tissue at the bladder neck itself.  A wire was placed through the resectoscope and a 22-French 3-way hematuria catheter was placed over this wire to ensure no damage to  the bladder neck.  After this was done, it was inflated with 30 mL of sterile water and placed on some mild traction.  It was irrigated until it was light pink.  Continuous bladder irrigation was then started.  Belladonna and opium suppository was placed into the patient's rectum and rectal exam was performed, which revealed no masses.  This completed the procedure. He was placed back in a supine position.  Anesthesia was reversed.  He was taken to the PACU in stable condition.  He will be kept overnight for continuous bladder irrigation.  He will be discharged if his urine remained clear tomorrow morning.  He will follow up for voiding trial. If there is any question of voiding trial whether he is able to void, a catheter should to be replaced and we will discuss performing the second stage of this procedure.          ______________________________ Natalia Leatherwood, MD     DW/MEDQ  D:  02/13/2012  T:  02/14/2012  Job:  [id number]

## 2012-02-14 NOTE — Progress Notes (Signed)
I stopped my pt's CBI according to MD's orders. His urine is not tea colored but a clear red color. I spoke to my charge nurse and she instructed me to wait and not irrigate the catheter.

## 2012-02-15 ENCOUNTER — Encounter (HOSPITAL_COMMUNITY): Payer: Self-pay | Admitting: Urology

## 2012-02-22 ENCOUNTER — Ambulatory Visit (INDEPENDENT_AMBULATORY_CARE_PROVIDER_SITE_OTHER): Payer: Medicare Other | Admitting: *Deleted

## 2012-02-22 DIAGNOSIS — Z7901 Long term (current) use of anticoagulants: Secondary | ICD-10-CM

## 2012-02-22 DIAGNOSIS — I4891 Unspecified atrial fibrillation: Secondary | ICD-10-CM

## 2012-02-24 ENCOUNTER — Encounter (HOSPITAL_COMMUNITY): Payer: Self-pay

## 2012-02-24 ENCOUNTER — Emergency Department (HOSPITAL_COMMUNITY): Payer: Medicare Other

## 2012-02-24 ENCOUNTER — Observation Stay (HOSPITAL_COMMUNITY): Payer: Medicare Other

## 2012-02-24 ENCOUNTER — Observation Stay (HOSPITAL_COMMUNITY)
Admission: EM | Admit: 2012-02-24 | Discharge: 2012-02-25 | Disposition: A | Discharge status: Home / Self Care | Payer: Medicare Other | Attending: Internal Medicine | Admitting: Internal Medicine

## 2012-02-24 DIAGNOSIS — R5381 Other malaise: Secondary | ICD-10-CM | POA: Insufficient documentation

## 2012-02-24 DIAGNOSIS — I480 Paroxysmal atrial fibrillation: Secondary | ICD-10-CM | POA: Diagnosis present

## 2012-02-24 DIAGNOSIS — Z87442 Personal history of urinary calculi: Secondary | ICD-10-CM | POA: Insufficient documentation

## 2012-02-24 DIAGNOSIS — I4891 Unspecified atrial fibrillation: Secondary | ICD-10-CM | POA: Insufficient documentation

## 2012-02-24 DIAGNOSIS — R55 Syncope and collapse: Principal | ICD-10-CM | POA: Diagnosis present

## 2012-02-24 DIAGNOSIS — Z7901 Long term (current) use of anticoagulants: Secondary | ICD-10-CM

## 2012-02-24 DIAGNOSIS — N400 Enlarged prostate without lower urinary tract symptoms: Secondary | ICD-10-CM | POA: Insufficient documentation

## 2012-02-24 DIAGNOSIS — R6510 Systemic inflammatory response syndrome (SIRS) of non-infectious origin without acute organ dysfunction: Secondary | ICD-10-CM | POA: Diagnosis present

## 2012-02-24 DIAGNOSIS — Z79899 Other long term (current) drug therapy: Secondary | ICD-10-CM | POA: Insufficient documentation

## 2012-02-24 DIAGNOSIS — I5022 Chronic systolic (congestive) heart failure: Secondary | ICD-10-CM | POA: Diagnosis present

## 2012-02-24 DIAGNOSIS — R42 Dizziness and giddiness: Secondary | ICD-10-CM | POA: Insufficient documentation

## 2012-02-24 DIAGNOSIS — R0989 Other specified symptoms and signs involving the circulatory and respiratory systems: Secondary | ICD-10-CM

## 2012-02-24 DIAGNOSIS — N189 Chronic kidney disease, unspecified: Secondary | ICD-10-CM | POA: Diagnosis present

## 2012-02-24 DIAGNOSIS — Z951 Presence of aortocoronary bypass graft: Secondary | ICD-10-CM | POA: Diagnosis present

## 2012-02-24 DIAGNOSIS — Z23 Encounter for immunization: Secondary | ICD-10-CM | POA: Insufficient documentation

## 2012-02-24 DIAGNOSIS — I2510 Atherosclerotic heart disease of native coronary artery without angina pectoris: Secondary | ICD-10-CM | POA: Insufficient documentation

## 2012-02-24 DIAGNOSIS — N390 Urinary tract infection, site not specified: Secondary | ICD-10-CM

## 2012-02-24 DIAGNOSIS — R9430 Abnormal result of cardiovascular function study, unspecified: Secondary | ICD-10-CM

## 2012-02-24 DIAGNOSIS — I129 Hypertensive chronic kidney disease with stage 1 through stage 4 chronic kidney disease, or unspecified chronic kidney disease: Secondary | ICD-10-CM | POA: Insufficient documentation

## 2012-02-24 DIAGNOSIS — I509 Heart failure, unspecified: Secondary | ICD-10-CM | POA: Insufficient documentation

## 2012-02-24 LAB — CBC WITH DIFFERENTIAL/PLATELET
Basophils Absolute: 0.1 10*3/uL (ref 0.0–0.1)
Basophils Relative: 1 % (ref 0–1)
Eosinophils Absolute: 0.3 10*3/uL (ref 0.0–0.7)
Lymphs Abs: 0.9 10*3/uL (ref 0.7–4.0)
MCH: 31.8 pg (ref 26.0–34.0)
Neutrophils Relative %: 77 % (ref 43–77)
Platelets: 207 10*3/uL (ref 150–400)
RBC: 4.28 MIL/uL (ref 4.22–5.81)
RDW: 13 % (ref 11.5–15.5)

## 2012-02-24 LAB — URINALYSIS, ROUTINE W REFLEX MICROSCOPIC
Bilirubin Urine: NEGATIVE
Glucose, UA: NEGATIVE mg/dL
Ketones, ur: NEGATIVE mg/dL
Specific Gravity, Urine: 1.014 (ref 1.005–1.030)
pH: 7 (ref 5.0–8.0)

## 2012-02-24 LAB — COMPREHENSIVE METABOLIC PANEL
ALT: 12 U/L (ref 0–53)
AST: 19 U/L (ref 0–37)
Albumin: 3.5 g/dL (ref 3.5–5.2)
Alkaline Phosphatase: 57 U/L (ref 39–117)
Calcium: 9.7 mg/dL (ref 8.4–10.5)
Glucose, Bld: 135 mg/dL — ABNORMAL HIGH (ref 70–99)
Potassium: 3.7 mEq/L (ref 3.5–5.1)
Sodium: 140 mEq/L (ref 135–145)
Total Protein: 7.3 g/dL (ref 6.0–8.3)

## 2012-02-24 LAB — URINE MICROSCOPIC-ADD ON

## 2012-02-24 LAB — DIGOXIN LEVEL: Digoxin Level: 1.5 ng/mL (ref 0.8–2.0)

## 2012-02-24 LAB — PROTIME-INR
INR: 1.35 (ref 0.00–1.49)
Prothrombin Time: 16.4 seconds — ABNORMAL HIGH (ref 11.6–15.2)

## 2012-02-24 MED ORDER — ONDANSETRON HCL 4 MG PO TABS: 4.0000 mg | ORAL_TABLET | Freq: Four times a day (QID) | ORAL | Status: DC | PRN

## 2012-02-24 MED ORDER — TAMSULOSIN HCL 0.4 MG PO CAPS
0.4000 mg | ORAL_CAPSULE | Freq: Every day | ORAL | Status: DC
  Administered 2012-02-25: 0.4 mg via ORAL
  Filled 2012-02-24: qty 1

## 2012-02-24 MED ORDER — DIGOXIN 250 MCG PO TABS
0.2500 mg | ORAL_TABLET | Freq: Every day | ORAL | Status: DC
  Administered 2012-02-25: 0.25 mg via ORAL
  Filled 2012-02-24 (×2): qty 1

## 2012-02-24 MED ORDER — ALBUTEROL SULFATE (5 MG/ML) 0.5% IN NEBU: 2.5000 mg | INHALATION_SOLUTION | RESPIRATORY_TRACT | Status: DC | PRN

## 2012-02-24 MED ORDER — ATORVASTATIN CALCIUM 40 MG PO TABS
40.0000 mg | ORAL_TABLET | Freq: Every day | ORAL | Status: DC
  Administered 2012-02-25: 40 mg via ORAL
  Filled 2012-02-24 (×2): qty 1

## 2012-02-24 MED ORDER — ONDANSETRON HCL 4 MG/2ML IJ SOLN: 4.0000 mg | Freq: Four times a day (QID) | INTRAMUSCULAR | Status: DC | PRN

## 2012-02-24 MED ORDER — OXYBUTYNIN CHLORIDE 5 MG PO TABS
5.0000 mg | ORAL_TABLET | Freq: Four times a day (QID) | ORAL | Status: DC | PRN
  Filled 2012-02-24: qty 1

## 2012-02-24 MED ORDER — HYDROCODONE-ACETAMINOPHEN 5-325 MG PO TABS: 1.0000 | ORAL_TABLET | ORAL | Status: DC | PRN

## 2012-02-24 MED ORDER — VITAMIN B-12 1000 MCG PO TABS
1000.0000 ug | ORAL_TABLET | Freq: Every day | ORAL | Status: DC
  Administered 2012-02-25: 1000 ug via ORAL
  Filled 2012-02-24: qty 1

## 2012-02-24 MED ORDER — WARFARIN SODIUM 7.5 MG PO TABS
7.5000 mg | ORAL_TABLET | Freq: Once | ORAL | Status: AC
  Administered 2012-02-24: 7.5 mg via ORAL
  Filled 2012-02-24: qty 1

## 2012-02-24 MED ORDER — DONEPEZIL HCL 10 MG PO TABS
10.0000 mg | ORAL_TABLET | Freq: Every day | ORAL | Status: DC
  Administered 2012-02-24: 10 mg via ORAL
  Filled 2012-02-24 (×2): qty 1

## 2012-02-24 MED ORDER — CARVEDILOL 12.5 MG PO TABS
12.5000 mg | ORAL_TABLET | Freq: Two times a day (BID) | ORAL | Status: DC
  Administered 2012-02-25: 12.5 mg via ORAL
  Filled 2012-02-24 (×3): qty 1

## 2012-02-24 MED ORDER — SODIUM CHLORIDE 0.9 % IV SOLN
INTRAVENOUS | Status: DC
  Administered 2012-02-24: 22:00:00 via INTRAVENOUS

## 2012-02-24 MED ORDER — OXYBUTYNIN CHLORIDE 5 MG PO TABS
5.0000 mg | ORAL_TABLET | Freq: Two times a day (BID) | ORAL | Status: DC
  Administered 2012-02-24 – 2012-02-25 (×2): 5 mg via ORAL
  Filled 2012-02-24 (×3): qty 1

## 2012-02-24 MED ORDER — SODIUM CHLORIDE 0.9 % IJ SOLN
3.0000 mL | Freq: Two times a day (BID) | INTRAMUSCULAR | Status: DC
  Administered 2012-02-24: 3 mL via INTRAVENOUS

## 2012-02-24 MED ORDER — SODIUM CHLORIDE 0.9 % IV SOLN: INTRAVENOUS | Status: DC

## 2012-02-24 MED ORDER — SODIUM CHLORIDE 0.9 % IV BOLUS (SEPSIS)
250.0000 mL | Freq: Once | INTRAVENOUS | Status: AC
  Administered 2012-02-24: 250 mL via INTRAVENOUS

## 2012-02-24 MED ORDER — WARFARIN VIDEO
Freq: Once | Status: AC
  Administered 2012-02-24: 21:00:00

## 2012-02-24 MED ORDER — POLYETHYLENE GLYCOL 3350 17 G PO PACK
17.0000 g | PACK | Freq: Every day | ORAL | Status: DC | PRN
  Filled 2012-02-24: qty 1

## 2012-02-24 MED ORDER — DUTASTERIDE 0.5 MG PO CAPS
0.5000 mg | ORAL_CAPSULE | Freq: Every day | ORAL | Status: DC
  Administered 2012-02-25: 0.5 mg via ORAL
  Filled 2012-02-24: qty 1

## 2012-02-24 MED ORDER — SENNOSIDES-DOCUSATE SODIUM 8.6-50 MG PO TABS
1.0000 | ORAL_TABLET | Freq: Two times a day (BID) | ORAL | Status: DC
  Administered 2012-02-25: 1 via ORAL
  Filled 2012-02-24 (×3): qty 1

## 2012-02-24 MED ORDER — WARFARIN - PHARMACIST DOSING INPATIENT: Freq: Every day | Status: DC

## 2012-02-24 MED ORDER — PATIENT'S GUIDE TO USING COUMADIN BOOK
Freq: Once | Status: AC
  Administered 2012-02-24: 21:00:00
  Filled 2012-02-24: qty 1

## 2012-02-24 MED ORDER — GUAIFENESIN-DM 100-10 MG/5ML PO SYRP: 5.0000 mL | ORAL_SOLUTION | ORAL | Status: DC | PRN

## 2012-02-24 NOTE — ED Provider Notes (Signed)
History     CSN: [id number]  Arrival date & time 02/24/12  1715   First MD Initiated Contact with Patient 02/24/12 1735      Chief Complaint  Patient presents with  . Loss of Consciousness    (Consider location/radiation/quality/duration/timing/severity/associated sxs/prior treatment) The history is provided by the patient and the EMS personnel.   patient is a 76 year old male with a syncopal episode at a funeral today. Take should felt dizzy and lightheadedness and then went to ground was witnessed by bystanders did not hit his head hard. According to the patient's wife he has passed out several times in the past most recent was in November. Workups have been negative these also had an MRI of his brain recently in May. Patient 2 weeks ago had some prostate surgery but is urinating fine on his own at this point in time. Patient denies any chest pain shortness of breath headache or any symptoms at all currently. With the other syncopal episodes he has been admitted and monitored workup so both inpatient and outpatient have been negative for an etiology.  Past Medical History  Diagnosis Date  . Hypertension   . Atrial fibrillation 06/2011    S/p left sided MAZE. On Coumadin, January, 2013  . Syncope     Near-syncope, etiology is deemed secondary to the excessive heat  . Benign prostatic hypertrophy     but stable  . Chronic anticoagulation   . Coronary artery disease 06/2011    S/p LIMA-LAD, SVG-intermediate coronary (?), and SVG-distal RCA with concurrent MV repair and MAZE  . Mitral regurgitation 06/2011    Mitral valve ring annuloplasty at the time of CABG,, January, 2013  . Shortness of breath   . Chronic kidney disease 12/2010    with a creatinine on discharge of 1.84, was  2.1 on admission  . Calculus of kidney   . H/O hiatal hernia   . Ejection fraction < 50%     EF previously 50%, however 20-25% by echo and catheterization before CABG  . Chronic kidney disease   . Foley  catheter in place     Past Surgical History  Procedure Date  . Circumcision 08/12/2002    because of phimosis  . Maze 06/06/2011    Procedure: MAZE;  Surgeon: [name], MD;  Location: [facility] OR;  Service: Open Heart Surgery;  Laterality: N/A;  . Mitral valve repair 06/06/2011    Procedure: MITRAL VALVE REPAIR (MVR);  Surgeon: [name], MD;  Location: [facility] OR;  Service: Open Heart Surgery;  Laterality: N/A;  . Coronary artery bypass graft 06/06/2011     grafts times three using left internal mammary artery and right leg greater saphenous vein harvested endoscopically  . Saturation biopsy of prostate   . Lithotripsy   . Transurethral resection of prostate 02/13/2012    Procedure: TRANSURETHRAL RESECTION OF THE PROSTATE WITH GYRUS INSTRUMENTS;  Surgeon: Milford Cage, MD;  Location: [facility] ORS;  Service: Urology;  Laterality: N/A;  . Cystoscopy 02/13/2012    Procedure: CYSTOSCOPY;  Surgeon: Milford Cage, MD;  Location: [facility] ORS;  Service: Urology;  Laterality: N/A;    Family History  Problem Relation Age of Onset  . Cancer Father   . Lung cancer Father   . Diabetes Father   . Hypertension Mother   . Hypertension Brother     History  Substance Use Topics  . Smoking status: Former Smoker -- 45 years    Types: Cigarettes  Quit date: 07/01/1964  . Smokeless tobacco: Former Neurosurgeon    Types: Chew  . Alcohol Use: No      Review of Systems  Constitutional: Positive for fatigue. Negative for fever.  HENT: Negative for neck pain.   Eyes: Negative for visual disturbance.  Respiratory: Negative for shortness of breath.   Cardiovascular: Negative for chest pain.  Gastrointestinal: Negative for nausea, vomiting, abdominal pain and diarrhea.  Genitourinary: Negative for dysuria.  Musculoskeletal: Negative for back pain.  Skin: Negative for rash.  Neurological: Positive for light-headedness. Negative for weakness and headaches.  Hematological: Does not bruise/bleed  easily.    Allergies  Review of patient's allergies indicates no known allergies.  Home Medications   Current Outpatient Rx  Name Route Sig Dispense Refill  . ATORVASTATIN CALCIUM 40 MG PO TABS Oral Take 40 mg by mouth daily after breakfast.    . CARVEDILOL 12.5 MG PO TABS Oral Take 12.5 mg by mouth 2 (two) times daily with a meal.    . DIGOXIN 0.25 MG PO TABS Oral Take 0.25 mg by mouth daily after breakfast.    . DONEPEZIL HCL 10 MG PO TABS Oral Take 10 mg by mouth at bedtime.    . DUTASTERIDE 0.5 MG PO CAPS Oral Take 0.5 mg by mouth daily. EVERY AM    . OMEGA-3 FATTY ACIDS 1000 MG PO CAPS Oral Take 1 g by mouth daily.    . FUROSEMIDE 40 MG PO TABS Oral Take 20 mg by mouth daily. Takes 1/2 tablet    . LISINOPRIL 20 MG PO TABS Oral Take 20 mg by mouth daily after breakfast.    . OXYBUTYNIN CHLORIDE 5 MG PO TABS Oral Take 5 mg by mouth as needed. Bladder spasm    . OXYCODONE HCL 5 MG PO TABS Oral Take 5-10 mg by mouth every 4 (four) hours as needed. For pain    . POTASSIUM CHLORIDE CRYS ER 10 MEQ PO TBCR Oral Take 10 mEq by mouth 4 (four) times daily.    Bernadette Hoit SODIUM 8.6-50 MG PO TABS Oral Take 1 tablet by mouth 2 (two) times daily.    Marland Kitchen TAMSULOSIN HCL 0.4 MG PO CAPS Oral Take 0.4 mg by mouth daily.    Marland Kitchen VITAMIN B-12 1000 MCG PO TABS Oral Take 1,000 mcg by mouth daily.      BP 118/79  Pulse 53  Temp 97.3 F (36.3 C) (Oral)  Resp 15  SpO2 100%  Physical Exam  Nursing note and vitals reviewed. Constitutional: He is oriented to person, place, and time. He appears well-developed and well-nourished. No distress.  HENT:  Head: Normocephalic and atraumatic.  Mouth/Throat: Oropharynx is clear and moist.  Eyes: Conjunctivae normal and EOM are normal. Pupils are equal, round, and reactive to light.  Neck: Normal range of motion. Neck supple.  Cardiovascular: Normal rate, regular rhythm and normal heart sounds.   No murmur heard. Pulmonary/Chest: Effort normal and  breath sounds normal. No respiratory distress. He has no wheezes. He has no rales.  Abdominal: Soft. Bowel sounds are normal. There is no tenderness.  Musculoskeletal: Normal range of motion. He exhibits no edema and no tenderness.  Neurological: He is alert and oriented to person, place, and time. No cranial nerve deficit. He exhibits normal muscle tone. Coordination normal.  Skin: Skin is warm. No rash noted. No erythema.    ED Course  Procedures (including critical care time)  Labs Reviewed  CBC WITH DIFFERENTIAL - Abnormal; Notable for the  following:    HCT 38.5 (*)     Neutro Abs 8.0 (*)     Lymphocytes Relative 9 (*)     Monocytes Absolute 1.1 (*)     All other components within normal limits  COMPREHENSIVE METABOLIC PANEL - Abnormal; Notable for the following:    Glucose, Bld 135 (*)     BUN 24 (*)     Creatinine, Ser 2.02 (*)     GFR calc non Af Amer 31 (*)     GFR calc Af Amer 36 (*)     All other components within normal limits  TROPONIN I  URINALYSIS, ROUTINE W REFLEX MICROSCOPIC   Dg Chest Port 1 View  02/24/2012  *RADIOLOGY REPORT*  Clinical Data: 76 year old male loss of consciousness.  PORTABLE CHEST - 1 VIEW  Comparison: 08/02/2011 and earlier.  Findings: Upright portable AP view 1826 hours.  Slightly lower lung volumes.  Sequelae of CABG again noted.  Cardiac size and mediastinal contours are within normal limits.  Allowing for portable technique, the lungs are clear.  No pneumothorax or effusion.  IMPRESSION: No acute cardiopulmonary abnormality.   Original Report Authenticated By: Harley Hallmark, M.D.      Date: 02/24/2012  Rate: 53  Rhythm: sinus bradycardia  QRS Axis: normal  Intervals: normal  ST/T Wave abnormalities: nonspecific T wave changes  Conduction Disutrbances:none  Narrative Interpretation:   Old EKG Reviewed: unchanged From 06/29/11  Results for orders placed during the hospital encounter of 02/24/12  CBC WITH DIFFERENTIAL      Component  Value Range   WBC 10.4  4.0 - 10.5 K/uL   RBC 4.28  4.22 - 5.81 MIL/uL   Hemoglobin 13.6  13.0 - 17.0 g/dL   HCT 16.1 (*) 09.6 - 04.5 %   MCV 90.0  78.0 - 100.0 fL   MCH 31.8  26.0 - 34.0 pg   MCHC 35.3  30.0 - 36.0 g/dL   RDW 40.9  81.1 - 91.4 %   Platelets 207  150 - 400 K/uL   Neutrophils Relative 77  43 - 77 %   Neutro Abs 8.0 (*) 1.7 - 7.7 K/uL   Lymphocytes Relative 9 (*) 12 - 46 %   Lymphs Abs 0.9  0.7 - 4.0 K/uL   Monocytes Relative 10  3 - 12 %   Monocytes Absolute 1.1 (*) 0.1 - 1.0 K/uL   Eosinophils Relative 3  0 - 5 %   Eosinophils Absolute 0.3  0.0 - 0.7 K/uL   Basophils Relative 1  0 - 1 %   Basophils Absolute 0.1  0.0 - 0.1 K/uL  COMPREHENSIVE METABOLIC PANEL      Component Value Range   Sodium 140  135 - 145 mEq/L   Potassium 3.7  3.5 - 5.1 mEq/L   Chloride 100  96 - 112 mEq/L   CO2 28  19 - 32 mEq/L   Glucose, Bld 135 (*) 70 - 99 mg/dL   BUN 24 (*) 6 - 23 mg/dL   Creatinine, Ser 7.82 (*) 0.50 - 1.35 mg/dL   Calcium 9.7  8.4 - 95.6 mg/dL   Total Protein 7.3  6.0 - 8.3 g/dL   Albumin 3.5  3.5 - 5.2 g/dL   AST 19  0 - 37 U/L   ALT 12  0 - 53 U/L   Alkaline Phosphatase 57  39 - 117 U/L   Total Bilirubin 0.4  0.3 - 1.2 mg/dL   GFR calc non Af  Amer 31 (*) >90 mL/min   GFR calc Af Amer 36 (*) >90 mL/min  TROPONIN I      Component Value Range   Troponin I <0.30  <0.30 ng/mL     1. Syncope       MDM  Discussed with hospitalist team he'll be needed to telemetry for monitoring due to the syncope. Labs here without significant findings other than some mild elevation in his BUN and creatinine. Troponin negative EKG without specific changes chest x-ray without specific changes. Patient asymptomatic here. Patient's primary care doctor is Dr. Kevan Ny.        Shelda Jakes, MD 02/24/12 [phone]

## 2012-02-24 NOTE — H&P (Signed)
[facility]                                                                                    Patient Demographics  Troy Gregory, is a 76 y.o. male  CSN: [id number]  MRN: [id number]  DOB - Jun 12, 1935  Admit Date - 02/24/2012  Outpatient Primary MD for the patient is Pearla Dubonnet, MD   With History of -  Past Medical History  Diagnosis Date  . Hypertension   . Atrial fibrillation 06/2011    S/p left sided MAZE. On Coumadin, January, 2013  . Syncope     Near-syncope, etiology is deemed secondary to the excessive heat  . Benign prostatic hypertrophy     but stable  . Chronic anticoagulation   . Coronary artery disease 06/2011    S/p LIMA-LAD, SVG-intermediate coronary (?), and SVG-distal RCA with concurrent MV repair and MAZE  . Mitral regurgitation 06/2011    Mitral valve ring annuloplasty at the time of CABG,, January, 2013  . Shortness of breath   . Chronic kidney disease 12/2010    with a creatinine on discharge of 1.84, was  2.1 on admission  . Calculus of kidney   . H/O hiatal hernia   . Ejection fraction < 50%     EF previously 50%, however 20-25% by echo and catheterization before CABG  . Chronic kidney disease   . Foley catheter in place       Past Surgical History  Procedure Date  . Circumcision 08/12/2002    because of phimosis  . Maze 06/06/2011    Procedure: MAZE;  Surgeon: [name], MD;  Location: [facility] OR;  Service: Open Heart Surgery;  Laterality: N/A;  . Mitral valve repair 06/06/2011    Procedure: MITRAL VALVE REPAIR (MVR);  Surgeon: [name], MD;  Location: [facility] OR;  Service: Open Heart Surgery;  Laterality: N/A;  . Coronary artery bypass graft 06/06/2011     grafts times three using left internal mammary artery and right leg greater saphenous vein harvested endoscopically  . Saturation biopsy of prostate   . Lithotripsy   . Transurethral resection of prostate 02/13/2012    Procedure: TRANSURETHRAL RESECTION OF THE  PROSTATE WITH GYRUS INSTRUMENTS;  Surgeon: Milford Cage, MD;  Location: [facility] ORS;  Service: Urology;  Laterality: N/A;  . Cystoscopy 02/13/2012    Procedure: CYSTOSCOPY;  Surgeon: Milford Cage, MD;  Location: [facility] ORS;  Service: Urology;  Laterality: N/A;    in for   Chief Complaint  Patient presents with  . Loss of Consciousness     HPI  Troy Gregory  is a 76 y.o. male, with history of atrial fibrillation, coronary artery disease status post CABG last year, recent TURP surgery for BPH few weeks ago, chronic Coumadin use, multiple syncopal episodes in the past with extensive neurological workup done by Dr. Regino Schultze neurologist, patient presents to the hospital after today while attending a funeral 45 where it was quite hot and warm patient started to experience generalized weakness, he didn't started slumping towards the floor he was helped by a few friends down to the floor, this was witnessed by some  friends and wife, patient was extremely lightheaded unclear whether he completely lost consciousness or not, physes there was no seizure-like activity, no bowel or bladder incontinence, although he wears depends due to his recent TURP surgery. Patient at that time denied any headache, no chest pain or palpitations, in the ER his workup was essentially unremarkable, I was called to admit the patient for syncope. Currently he is completely symptom free with no subjective complaints except he feels some generalized weakness since his TURP surgery.    Review of Systems    In addition to the HPI above,  No Fever-chills, No Headache, No changes with Vision or hearing, No problems swallowing food or Liquids, No Chest pain, Cough or Shortness of Breath, No Abdominal pain, No Nausea or Vommitting, Bowel movements are regular, No Blood in stool or Urine, No dysuria, No new skin rashes or bruises, No new joints pains-aches,  No new weakness, tingling, numbness in any extremity, No recent  weight gain or loss, No polyuria, polydypsia or polyphagia, No significant Mental Stressors.  A full 10 point Review of Systems was done, except as stated above, all other Review of Systems were negative.   Social History History  Substance Use Topics  . Smoking status: Former Smoker -- 45 years    Types: Cigarettes    Quit date: 07/01/1964  . Smokeless tobacco: Former Neurosurgeon    Types: Chew  . Alcohol Use: No     Family History Family History  Problem Relation Age of Onset  . Cancer Father   . Lung cancer Father   . Diabetes Father   . Hypertension Mother   . Hypertension Brother      Prior to Admission medications   Medication Sig Start Date End Date Taking? Authorizing Provider  atorvastatin (LIPITOR) 40 MG tablet Take 40 mg by mouth daily after breakfast.   Yes Historical Provider, MD  carvedilol (COREG) 12.5 MG tablet Take 12.5 mg by mouth 2 (two) times daily with a meal.   Yes Historical Provider, MD  digoxin (LANOXIN) 0.25 MG tablet Take 0.25 mg by mouth daily after breakfast.   Yes Historical Provider, MD  donepezil (ARICEPT) 10 MG tablet Take 10 mg by mouth at bedtime.   Yes Historical Provider, MD  dutasteride (AVODART) 0.5 MG capsule Take 0.5 mg by mouth daily. EVERY AM   Yes Historical Provider, MD  fish oil-omega-3 fatty acids 1000 MG capsule Take 1 g by mouth daily.   Yes Historical Provider, MD  furosemide (LASIX) 40 MG tablet Take 20 mg by mouth daily. Takes 1/2 tablet   Yes Historical Provider, MD  lisinopril (PRINIVIL,ZESTRIL) 20 MG tablet Take 20 mg by mouth daily after breakfast.   Yes Historical Provider, MD  oxybutynin (DITROPAN) 5 MG tablet Take 5 mg by mouth as needed. Bladder spasm   Yes Historical Provider, MD  oxyCODONE (OXY IR/ROXICODONE) 5 MG immediate release tablet Take 5-10 mg by mouth every 4 (four) hours as needed. For pain 02/14/12 02/24/12 Yes Milford Cage, MD  potassium chloride (K-DUR,KLOR-CON) 10 MEQ tablet Take 10 mEq by mouth 4  (four) times daily.   Yes Historical Provider, MD  senna-docusate (SENOKOT-S) 8.6-50 MG per tablet Take 1 tablet by mouth 2 (two) times daily. 02/14/12 02/13/13 Yes Milford Cage, MD  Tamsulosin HCl (FLOMAX) 0.4 MG CAPS Take 0.4 mg by mouth daily.   Yes Historical Provider, MD  vitamin B-12 (CYANOCOBALAMIN) 1000 MCG tablet Take 1,000 mcg by mouth daily.   Yes Historical  Provider, MD    No Known Allergies  Physical Exam  Vitals  Blood pressure 121/69, pulse 54, temperature 97.3 F (36.3 C), temperature source Oral, resp. rate 18, SpO2 97.00%.   1. General elderly well-built Caucasian male lying in bed in NAD,   2. Normal affect and insight, Not Suicidal or Homicidal, Awake Alert, Oriented X 3.  3. No F.N deficits, ALL C.Nerves Intact, Strength 5/5 all 4 extremities, Sensation intact all 4 extremities, Plantars down going.  4. Ears and Eyes appear Normal, Conjunctivae clear, PERRLA. Moist Oral Mucosa.  5. Supple Neck, No JVD, No cervical lymphadenopathy appriciated, No Carotid Bruits.  6. Symmetrical Chest wall movement, Good air movement bilaterally, CTAB, CABG scar has healed nicely.  7. RRR, No Gallops, Rubs or Murmurs, No Parasternal Heave.  8. Positive Bowel Sounds, Abdomen Soft, Non tender, No organomegaly appriciated,No rebound -guarding or rigidity.  9.  No Cyanosis, Normal Skin Turgor, No Skin Rash or Bruise.  10. Good muscle tone,  joints appear normal , no effusions, Normal ROM.  11. No Palpable Lymph Nodes in Neck or Axillae    Data Review  CBC  Lab 02/24/12 1751  WBC 10.4  HGB 13.6  HCT 38.5*  PLT 207  MCV 90.0  MCH 31.8  MCHC 35.3  RDW 13.0  LYMPHSABS 0.9  MONOABS 1.1*  EOSABS 0.3  BASOSABS 0.1  BANDABS --   ------------------------------------------------------------------------------------------------------------------  Chemistries   Lab 02/24/12 1751  NA 140  K 3.7  CL 100  CO2 28  GLUCOSE 135*  BUN 24*  CREATININE 2.02*    CALCIUM 9.7  MG --  AST 19  ALT 12  ALKPHOS 57  BILITOT 0.4   ------------------------------------------------------------------------------------------------------------------ CrCl is unknown because both a height and weight (above a minimum accepted value) are required for this calculation. ------------------------------------------------------------------------------------------------------------------ No results found for this basename: TSH,T4TOTAL,FREET3,T3FREE,THYROIDAB in the last 72 hours   Coagulation profile  Lab 02/22/12 0956  INR 1.1  PROTIME --   ------------------------------------------------------------------------------------------------------------------- No results found for this basename: DDIMER:2 in the last 72 hours -------------------------------------------------------------------------------------------------------------------  Cardiac Enzymes  Lab 02/24/12 1752  CKMB --  TROPONINI <0.30  MYOGLOBIN --      Imaging results:   Dg Chest Port 1 View  02/24/2012  *RADIOLOGY REPORT*  Clinical Data: 76 year old male loss of consciousness.  PORTABLE CHEST - 1 VIEW  Comparison: 08/02/2011 and earlier.  Findings: Upright portable AP view 1826 hours.  Slightly lower lung volumes.  Sequelae of CABG again noted.  Cardiac size and mediastinal contours are within normal limits.  Allowing for portable technique, the lungs are clear.  No pneumothorax or effusion.  IMPRESSION: No acute cardiopulmonary abnormality.   Original Report Authenticated By: Harley Hallmark, M.D.     My personal review of EKG: Rhythm NSR, Rate 53 /min, old stable T-wave changes anterior and lateral    Last echogram January 2013  - Left ventricle: Hypokinesis worse in the septum and apex The cavity size was moderately dilated. Wall thickness was increased in a pattern of mild LVH. Systolic function was moderately reduced. The estimated ejection fraction was in the range of 35% to 40%.  Diffuse hypokinesis. - Aortic valve: Mildly calcified annulus. There was mild stenosis. - Mitral valve: S/P mitral valve repair with no regurgitation or stenosis Valve area by continuity equation (using LVOT flow): 1.59cm^2. - Left atrium: The atrium was moderately dilated. - Atrial septum: No defect or patent foramen ovale was identified.    Assessment & Plan  1. Syncopal episode which has had been a problem in the past - extensive negative neurological and cardiac workup, patient had CABG one year ago, this episode sounds like vasovagal event brought on by emotional stress and heat along with mild dehydration. Since patient is on Coumadin will check a stat INR along with head CT although he has no headache or focal drug deficits, gentle IV fluids, will check orthostatic blood pressures, will monitor him on telemetry and have PT evaluate, patient will be seen by his primary care physician Dr. Kevan Ny in the morning further workup or Dr. Kevan Ny as thought appropriate.   2. History of CAD and atrial fibrillation- currently in sinus rhythm rate around 53 beats per minute, EKG has chronic stable changes, troponin is negative, we'll continue his beta blocker and digoxin, will check a digoxin level, pharmacy to monitor Coumadin levels, will check baseline INR now then daily. Patient is chest pain palpitations and symptom-free.   3. ARF on Chronic kidney disease stage IV creatinine appears to be slightly higher than his baseline which is around 1.5-1.7, hold ACE inhibitor, hold Lasix, gentle IV fluids and monitor BMP in the morning. This could reflect mild dehydration.   4. Chronic systolic heart failure last EF around 45% in January-clinically compensated with no acute issues, home medications including beta blocker will be continued, will hold Lasix and ACE tonight as patient syncopized and creatinine slightly high, please address fluids and Lasix dose in the morning.   5. Recent TURP surgery  with ongoing hematuria- will let INR come up gently, will check a UA with cultures thereafter outpatient urology followup if no acute issues.    DVT Prophylaxis Coumadin & SCDs    AM Labs Ordered, also please review Full Orders  Family Communication: Admission, patients condition and plan of care including tests being ordered have been discussed with the patient and wife who indicate understanding and agree with the plan and Code Status.  Code Status Full  Disposition Plan: Home  Time spent in minutes : 40  Condition Marinell Blight K M.D on 02/24/2012 at 7:40 PM  Between 7am to 7pm - Pager - [phone]  After 7pm go to [email or web address] - password [email or web address]  And look for the night coverage person covering me after hours  [facility] Office  [phone]

## 2012-02-24 NOTE — ED Notes (Signed)
Per EMS, pt was at funeral and leaning against a gravestone.  Pt became dizzy and sat down.  Pt then was assisted to the ground to lay down.  Pt denies he lost consciousness, however bystanders confirm he did.  Bystanders also state pt has had several episodes of same over the last couple weeks.  Pt had heart surgery 06/2011.  Pt had prostate surgery 2 weeks ago.  Pt states he still has some blood in urine.

## 2012-02-24 NOTE — Progress Notes (Addendum)
ANTICOAGULATION CONSULT NOTE - Initial Consult  Pharmacy Consult for Coumadin Indication: atrial fibrillation  No Known Allergies    Labs:  Basename 02/24/12 1752 02/24/12 1751 02/22/12 0956  HGB -- 13.6 --  HCT -- 38.5* --  PLT -- 207 --  APTT -- -- --  LABPROT -- -- --  INR -- -- 1.1  HEPARINUNFRC -- -- --  CREATININE -- 2.02* --  CKTOTAL -- -- --  CKMB -- -- --  TROPONINI <0.30 -- --    The CrCl is unknown because both a height and weight (above a minimum accepted value) are required for this calculation.   Medical History: Past Medical History  Diagnosis Date  . Hypertension   . Atrial fibrillation 06/2011    S/p left sided MAZE. On Coumadin, January, 2013  . Syncope     Near-syncope, etiology is deemed secondary to the excessive heat  . Benign prostatic hypertrophy     but stable  . Chronic anticoagulation   . Coronary artery disease 06/2011    S/p LIMA-LAD, SVG-intermediate coronary (?), and SVG-distal RCA with concurrent MV repair and MAZE  . Mitral regurgitation 06/2011    Mitral valve ring annuloplasty at the time of CABG,, January, 2013  . Shortness of breath   . Chronic kidney disease 12/2010    with a creatinine on discharge of 1.84, was  2.1 on admission  . Calculus of kidney   . H/O hiatal hernia   . Ejection fraction < 50%     EF previously 50%, however 20-25% by echo and catheterization before CABG  . Chronic kidney disease   . Foley catheter in place    Assessment: Admit INR sub-therapeutic.  Supposed to be on Coumadin for Afib, MVR.  Admitted with syncopal episode, awaiting CT of head to rule out bleed.  INR is 1.20  Goal of Therapy:  INR 2-3 Monitor platelets by anticoagulation protocol: Yes   Plan:  1) CT negative for bleed 2) Daily INR 3) Coumadin 7.5 mg po x 1 tonight  Thank you. Okey Regal, PharmD [phone]  02/24/2012,8:00 PM

## 2012-02-25 LAB — CBC
Hemoglobin: 12.7 g/dL — ABNORMAL LOW (ref 13.0–17.0)
Platelets: 207 10*3/uL (ref 150–400)
RBC: 4.11 MIL/uL — ABNORMAL LOW (ref 4.22–5.81)

## 2012-02-25 LAB — BASIC METABOLIC PANEL
GFR calc Af Amer: 42 mL/min — ABNORMAL LOW (ref >90)
GFR calc non Af Amer: 36 mL/min — ABNORMAL LOW (ref >90)
Potassium: 3.4 mEq/L — ABNORMAL LOW (ref 3.5–5.1)
Sodium: 140 mEq/L (ref 135–145)

## 2012-02-25 LAB — PROTIME-INR: INR: 1.68 — ABNORMAL HIGH (ref 0.00–1.49)

## 2012-02-25 LAB — VITAMIN B12: Vitamin B-12: 907 pg/mL (ref 211–911)

## 2012-02-25 MED ORDER — SENNOSIDES-DOCUSATE SODIUM 8.6-50 MG PO TABS: 1.0000 | ORAL_TABLET | Freq: Two times a day (BID) | ORAL | Status: DC

## 2012-02-25 MED ORDER — FUROSEMIDE 40 MG PO TABS: ORAL_TABLET | ORAL | Status: DC

## 2012-02-25 MED ORDER — OXYBUTYNIN CHLORIDE 5 MG PO TABS: 5.0000 mg | ORAL_TABLET | Freq: Four times a day (QID) | ORAL | Status: DC | PRN

## 2012-02-25 MED ORDER — WARFARIN SODIUM 5 MG PO TABS: 5.0000 mg | ORAL_TABLET | Freq: Once | ORAL | Status: DC

## 2012-02-25 MED ORDER — OXYCODONE HCL 5 MG PO TABS: 5.0000 mg | ORAL_TABLET | ORAL | Status: AC | PRN

## 2012-02-25 MED ORDER — WARFARIN SODIUM 7.5 MG PO TABS
7.5000 mg | ORAL_TABLET | Freq: Once | ORAL | Status: DC
  Filled 2012-02-25: qty 1

## 2012-02-25 MED ORDER — LISINOPRIL 20 MG PO TABS: ORAL_TABLET | ORAL | Status: DC

## 2012-02-25 MED ORDER — WARFARIN SODIUM 5 MG PO TABS
5.0000 mg | ORAL_TABLET | Freq: Once | ORAL | Status: DC
  Filled 2012-02-25: qty 1

## 2012-02-25 MED ORDER — CARVEDILOL 12.5 MG PO TABS: ORAL_TABLET | ORAL | Status: DC

## 2012-02-25 NOTE — Progress Notes (Signed)
UR Completed Taressa Rauh Graves-Bigelow, RN,BSN [phone]  

## 2012-02-25 NOTE — Progress Notes (Addendum)
Physician Discharge Summary  NAME:Troy Gregory  EXB:[id number]  DOB: [birth year]   Admit date: 02/24/2012 Discharge date: 02/25/2012  Discharge Diagnoses:  Principal Problem: Syncope - actually experienced near-syncope and was probably a vasovagal reaction and possibly contributed to by relatively slow heart rate on medications and low blood pressure Active Problems:  Atrial fibrillation - currently in sinus rhythm but on chronic anticoagulation therapy  Chronic kidney disease - creatinine slightly higher than usual but will be reducing lisinopril and Lasix dosing and recheck in office in 2-3 days  Systolic CHF, chronic - symptomatically stable  Coronary artery disease  Encounter for long-term (current) use of anticoagulants Status post Maze procedure   Discharge Condition: Improved  Hospital Course: 76 year old male with recent TURP in the past 10-14 days secondary to BPH.  Has a history of paroxysmal atrial fibrillation but recently in normal sinus rhythm.  Status post Maze procedure this past year.  He also has a history of hypertension and coronary artery disease with history of CABG.  He also has mild to moderate renal insufficiency. He has had multiple syncopal episodes in the past with extensive neurological workup as per Dr. Arie Sabina of neurology.  He was attending a funeral yesterday where it was quite warm out and he experienced generalized weakness and dizziness and felt like he was going to pass out and became extremely lightheaded.  He was helped to the ground and there was no seizure activity or bowel or bladder incontinence.  EMS was called and he was brought to the emergency room and has had no further symptoms except some mild generalized weakness.  Feeling much better today.  Lisinopril and Lasix were held and we'll continue to hold until I see him in the office later this week.  Will have him ambulate this morning and of asymptomatic we'll discharge home.  We'll plan to reduce  Coreg to 6.25 milligrams twice daily and hold lisinopril and Lasix for now and likely reduce the dose on these 2 medications  Consults: N/A  Disposition: 01-Home or Self Care  Discharge Orders    Future Appointments: Provider: Department: Dept Phone: Center:   02/29/2012 10:15 AM [facility] [phone] None       Medication List     As of 02/25/2012  8:23 AM    ASK your doctor about these medications         atorvastatin 40 MG tablet   Commonly known as: LIPITOR   Take 40 mg by mouth daily after breakfast.      carvedilol 12.5 MG tablet   Commonly known as: COREG   Take 12.5 mg by mouth, one half tablet 2 (two) times daily with a meal.      digoxin 0.25 MG tablet   Commonly known as: LANOXIN   Take 0.25 mg by mouth daily after breakfast.      donepezil 10 MG tablet   Commonly known as: ARICEPT   Take 10 mg by mouth at bedtime.      dutasteride 0.5 MG capsule   Commonly known as: AVODART   Take 0.5 mg by mouth daily. EVERY AM      fish oil-omega-3 fatty acids 1000 MG capsule   Take 1 g by mouth daily.      furosemide 40 MG tablet   Commonly known as: LASIX   Take 20 mg by mouth daily. Takes 1/2 tablet - but hold for now       lisinopril 20 MG tablet  Commonly known as: PRINIVIL,ZESTRIL    hold for now       oxybutynin 5 MG tablet   Commonly known as: DITROPAN   Take 5 mg by mouth as needed. Bladder spasm      oxyCODONE 5 MG immediate release tablet   Commonly known as: Oxy IR/ROXICODONE   Take 5-10 mg by mouth every 4 (four) hours as needed. For pain      potassium chloride 10 MEQ tablet   Commonly known as: K-DUR,KLOR-CON   Take 10 mEq by mouth 4 (four) times daily.      senna-docusate 8.6-50 MG per tablet   Commonly known as: Senokot-S   Take 1 tablet by mouth 2 (two) times daily.      Tamsulosin HCl 0.4 MG Caps   Commonly known as: FLOMAX   Take 0.4 mg by mouth daily.      vitamin B-12 1000 MCG tablet    Commonly known as: CYANOCOBALAMIN   Take 1,000 mcg by mouth daily.         Things to follow up in the outpatient setting: Follow heart rate and symptoms of dizziness  Time coordinating discharge: 25 minutes  The results of significant diagnostics from this hospitalization (including imaging, microbiology, ancillary and laboratory) are listed below for reference.    Significant Diagnostic Studies: Ct Head Wo Contrast  02/24/2012  *RADIOLOGY REPORT*  Clinical Data: 76 year old male with syncope on Coumadin. Weakness.  CT HEAD WITHOUT CONTRAST  Technique:  Contiguous axial images were obtained from the base of the skull through the vertex without contrast.  Comparison: Brain MRI 09/27/2011 and earlier.  Findings: Chronic opacification of the left ethmoid and frontal sinuses.  Other Visualized paranasal sinuses and mastoids are clear.  Stable visualized osseous structures.  Visualized orbits and scalp soft tissues are within normal limits. Calcified atherosclerosis at the skull base.  Stable cerebral volume. No midline shift, mass effect, or evidence of mass lesion.  No ventriculomegaly. No acute intracranial hemorrhage identified.  Chronic subcortical white matter hypodensity. No evidence of cortically based acute infarction identified.  IMPRESSION: No acute intracranial abnormality.   Original Report Authenticated By: Harley Hallmark, M.D.    Dg Chest Port 1 View  02/24/2012  *RADIOLOGY REPORT*  Clinical Data: 76 year old male loss of consciousness.  PORTABLE CHEST - 1 VIEW  Comparison: 08/02/2011 and earlier.  Findings: Upright portable AP view 1826 hours.  Slightly lower lung volumes.  Sequelae of CABG again noted.  Cardiac size and mediastinal contours are within normal limits.  Allowing for portable technique, the lungs are clear.  No pneumothorax or effusion.  IMPRESSION: No acute cardiopulmonary abnormality.   Original Report Authenticated By: Harley Hallmark, M.D.     Microbiology: No  results found for this or any previous visit (from the past 240 hour(s)).   Labs: Results for orders placed during the hospital encounter of 02/24/12  CBC WITH DIFFERENTIAL      Component Value Range   WBC 10.4  4.0 - 10.5 K/uL   RBC 4.28  4.22 - 5.81 MIL/uL   Hemoglobin 13.6  13.0 - 17.0 g/dL   HCT 16.1 (*) 09.6 - 04.5 %   MCV 90.0  78.0 - 100.0 fL   MCH 31.8  26.0 - 34.0 pg   MCHC 35.3  30.0 - 36.0 g/dL   RDW 40.9  81.1 - 91.4 %   Platelets 207  150 - 400 K/uL   Neutrophils Relative 77  43 - 77 %  Neutro Abs 8.0 (*) 1.7 - 7.7 K/uL   Lymphocytes Relative 9 (*) 12 - 46 %   Lymphs Abs 0.9  0.7 - 4.0 K/uL   Monocytes Relative 10  3 - 12 %   Monocytes Absolute 1.1 (*) 0.1 - 1.0 K/uL   Eosinophils Relative 3  0 - 5 %   Eosinophils Absolute 0.3  0.0 - 0.7 K/uL   Basophils Relative 1  0 - 1 %   Basophils Absolute 0.1  0.0 - 0.1 K/uL  COMPREHENSIVE METABOLIC PANEL      Component Value Range   Sodium 140  135 - 145 mEq/L   Potassium 3.7  3.5 - 5.1 mEq/L   Chloride 100  96 - 112 mEq/L   CO2 28  19 - 32 mEq/L   Glucose, Bld 135 (*) 70 - 99 mg/dL   BUN 24 (*) 6 - 23 mg/dL   Creatinine, Ser 1.61 (*) 0.50 - 1.35 mg/dL   Calcium 9.7  8.4 - 09.6 mg/dL   Total Protein 7.3  6.0 - 8.3 g/dL   Albumin 3.5  3.5 - 5.2 g/dL   AST 19  0 - 37 U/L   ALT 12  0 - 53 U/L   Alkaline Phosphatase 57  39 - 117 U/L   Total Bilirubin 0.4  0.3 - 1.2 mg/dL   GFR calc non Af Amer 31 (*) >90 mL/min   GFR calc Af Amer 36 (*) >90 mL/min  URINALYSIS, ROUTINE W REFLEX MICROSCOPIC      Component Value Range   Color, Urine YELLOW  YELLOW   APPearance CLOUDY (*) CLEAR   Specific Gravity, Urine 1.014  1.005 - 1.030   pH 7.0  5.0 - 8.0   Glucose, UA NEGATIVE  NEGATIVE mg/dL   Hgb urine dipstick LARGE (*) NEGATIVE   Bilirubin Urine NEGATIVE  NEGATIVE   Ketones, ur NEGATIVE  NEGATIVE mg/dL   Protein, ur 045 (*) NEGATIVE mg/dL   Urobilinogen, UA 0.2  0.0 - 1.0 mg/dL   Nitrite NEGATIVE  NEGATIVE   Leukocytes,  UA SMALL (*) NEGATIVE  TROPONIN I      Component Value Range   Troponin I <0.30  <0.30 ng/mL  PROTIME-INR      Component Value Range   Prothrombin Time 16.4 (*) 11.6 - 15.2 seconds   INR 1.35  0.00 - 1.49  URINE MICROSCOPIC-ADD ON      Component Value Range   Squamous Epithelial / LPF RARE  RARE   WBC, UA 3-6  <3 WBC/hpf   RBC / HPF 21-50  <3 RBC/hpf  PROTIME-INR      Component Value Range   Prothrombin Time 19.2 (*) 11.6 - 15.2 seconds   INR 1.68 (*) 0.00 - 1.49  DIGOXIN LEVEL      Component Value Range   Digoxin Level 1.5  0.8 - 2.0 ng/mL  CBC      Component Value Range   WBC 7.7  4.0 - 10.5 K/uL   RBC 4.11 (*) 4.22 - 5.81 MIL/uL   Hemoglobin 12.7 (*) 13.0 - 17.0 g/dL   HCT 40.9 (*) 81.1 - 91.4 %   MCV 91.0  78.0 - 100.0 fL   MCH 30.9  26.0 - 34.0 pg   MCHC 34.0  30.0 - 36.0 g/dL   RDW 78.2  95.6 - 21.3 %   Platelets 207  150 - 400 K/uL     Signed: Rainna Nearhood NEVILL 02/25/2012, 8:23 AM

## 2012-02-25 NOTE — Progress Notes (Signed)
Physical Therapy Evaluation Patient Details Name: Troy Gregory MRN: [id number] DOB: Oct 23, 1935 Today's Date: 02/25/2012 Time: 4132-4401 PT Time Calculation (min): 33 min  PT Assessment / Plan / Recommendation Clinical Impression  Very pleasant and joking 76 yo male admitted with syncopal episode presents overall moving well and OK for dc home from PT standpoint;   Recommending Outpt PT follow-up for a detailed fall risk assessment and possible intervention; Pt will need a prescription for Outpt PT for gait and balance dysfunction;   he may also benefit from a simple Outpt PT balance screen, which the [facility] can provide (the number to set up an appt is [phone]);   This being said, it is possible that pt will decline Outpt PT appt (he states he gets plenty of exercise/walking at home, and that he thought PT at SNF after his heart surgery was "a joke"), and I informed him that a Balance Screen is different, and can assess his risk of falling and give ways to decr fall risk, and that we can make recommendations, and that it is ultimately his choice to make an appt or not    PT Assessment  All further PT needs can be met in the next venue of care    Follow Up Recommendations  Outpatient PT    Barriers to Discharge        Equipment Recommendations  None recommended by PT    Recommendations for Other Services     Frequency      Precautions / Restrictions Precautions Precautions: None   Pertinent Vitals/Pain See Doc Flowsheets for Orthostatic Vitals      Mobility  Bed Mobility Bed Mobility: Supine to Sit;Sitting - Scoot to Edge of Bed Supine to Sit: 6: Modified independent (Device/Increase time) Sitting - Scoot to Edge of Bed: 6: Modified independent (Device/Increase time) Details for Bed Mobility Assistance: Incr time to manage covers Transfers Transfers: Sit to Stand;Stand to Sit Sit to Stand: 5: Supervision;From bed Stand to Sit: 5: Supervision;To  chair/3-in-1;With armrests Details for Transfer Assistance: pretty smooth transitions; a bit slow, which pt attributes to not being up much since in hospital Ambulation/Gait Ambulation/Gait Assistance: 5: Supervision;6: Modified independent (Device/Increase time) Ambulation Distance (Feet): 250 Feet Assistive device: None;Other (Comment) (and pushing IV pole to simulate cane) Ambulation/Gait Assistance Details: Overall ambulating well, with good distance, and no gross loss of balance, though some small losses of balance from which pt recovered without physical assist; Noted slower gait and toe-out pattern Gait velocity: slowed Stairs: Yes Stairs Assistance: 5: Supervision Stairs Assistance Details (indicate cue type and reason): Supervision mostly for managing IV tubing Stair Management Technique: One rail Right;Forwards Number of Stairs: 2  (x3)    Exercises     PT Diagnosis: Abnormality of gait (recommending balance screen)  PT Problem List: Decreased balance;Decreased coordination PT Treatment Interventions:     PT Goals Acute Rehab PT Goals PT Goal Formulation:  (for eminient dc today)  Visit Information  Last PT Received On: 02/25/12 Assistance Needed: +1    Subjective Data  Subjective: Agreeable to walk; Looking forward [place] getting back to [place] land; Reports he does a lot of walking at home Patient Stated Goal: Home   Prior Functioning  Home Living Lives With: Spouse Available Help at Discharge: Family;Available PRN/intermittently (spouse works days; son helps out (works 24 on/24 off)) Type of Home: House Home Access: Level entry (to [employer]) Home Layout: Two level Alternate Level Stairs-Number of Steps: 2 (Den to the rest of  the house) Alternate Level Stairs-Rails: None Home Adaptive Equipment: Straight cane Prior Function Level of Independence: Independent with assistive device(s) ("I lean on the cane at auctions") Able to Take Stairs?: Yes Driving: Yes Vocation: Other  (comment) 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); has horses Communication Communication: No difficulties    Cognition  Overall Cognitive Status: Appears within functional limits for tasks assessed/performed Arousal/Alertness: Awake/alert Orientation Level: Appears intact for tasks assessed Behavior During Session: [facility] for tasks performed    Extremity/Trunk Assessment Right Upper Extremity Assessment RUE ROM/Strength/Tone: Within functional levels Left Upper Extremity Assessment LUE ROM/Strength/Tone: Within functional levels Right Lower Extremity Assessment RLE ROM/Strength/Tone: WFL for tasks assessed Left Lower Extremity Assessment LLE ROM/Strength/Tone: WFL for tasks assessed Trunk Assessment Trunk Assessment: Normal   Balance    End of Session PT - End of Session Equipment Utilized During Treatment: Gait belt Activity Tolerance: Patient tolerated treatment well Patient left: in chair;with call bell/phone within reach Nurse Communication: Mobility status  GP     Troy Gregory, [place] [phone]  02/25/2012, 10:31 AM

## 2012-02-25 NOTE — Progress Notes (Addendum)
ANTICOAGULATION CONSULT NOTE - Initial Consult  Pharmacy Consult for Coumadin Indication: atrial fibrillation  No Known Allergies  Assessment: INR sub-therapeutic this AM but trending higher today 1.68 after 1 x 7.5mg  dose of Warfarin.  He has a history of Afib and MVR.  No bleeding noted this morning and his H/H and platelets are stable.  OF note:  He has had worsening creatinine yesterday and a clearance now estimated at 85ml/min.  He will likely accumulate his Digoxin with current renal function, consider rechecking digoxin soon if his renal function doesn't improve.  Plan today is to discharge home if no more light-headed upon ambulation.  Goal of Therapy:  INR 2-3 Monitor platelets by anticoagulation protocol: Yes   Plan:  1) Coumadin 5 mg po x 1 tonight if not discharged 2)  Daily PT/INR 3)  Monitor platelets and bleeding complications 4)  Consider checking another Digoxin level or reduce his dose to 0.186mcg daily.  Labs:  [facility] 02/25/12 0605 02/24/12 2116 02/24/12 1752 02/24/12 1751 02/22/12 0956  HGB 12.7* -- -- 13.6 --  HCT 37.4* -- -- 38.5* --  PLT 207 -- -- 207 --  LABPROT 19.2* 16.4* -- -- --  INR 1.68* 1.35 -- -- 1.1  CREATININE -- -- -- 2.02* --  TROPONINI -- -- <0.30 -- --   Estimated Creatinine Clearance: 35.2 ml/min (by C-G formula based on Cr of 2.02).  Medical History: Past Medical History  Diagnosis Date  . Hypertension   . Atrial fibrillation 06/2011    S/p left sided MAZE. On Coumadin, January, 2013  . Syncope     Near-syncope, etiology is deemed secondary to the excessive heat  . Benign prostatic hypertrophy     but stable  . Chronic anticoagulation   . Coronary artery disease 06/2011    S/p LIMA-LAD, SVG-intermediate coronary (?), and SVG-distal RCA with concurrent MV repair and MAZE  . Mitral regurgitation 06/2011    Mitral valve ring annuloplasty at the time of CABG,, January, 2013  . Shortness of breath   . Chronic kidney disease  12/2010    with a creatinine on discharge of 1.84, was  2.1 on admission  . Calculus of kidney   . H/O hiatal hernia   . Ejection fraction < 50%     EF previously 50%, however 20-25% by echo and catheterization before CABG  . Chronic kidney disease   . Foley catheter in place    Nadara Mustard, PharmD., MS Clinical Pharmacist Pager:  [phone]  Thank you for allowing pharmacy to be part of this patients care team.   02/25/2012,8:48 AM

## 2012-02-25 NOTE — Care Management Note (Signed)
    Page 1 of 1   02/25/2012     11:47:17 AM   CARE MANAGEMENT NOTE 02/25/2012  Patient:  Troy Gregory, Troy Gregory   Account Number:  [id number]  Date Initiated:  02/25/2012  Documentation initiated by:  GRAVES-BIGELOW,Yvett Rossel  Subjective/Objective Assessment:   Pt is from home with wife. Was at a funeral yesterday and had a syncopal episode.     Action/Plan:   PT recommends Outpatient PT at this time. CM did try to see if pt was agreeable and he feels like he does enough exercise at home. RN did make MD aware and MD states he will set pt up via the office with outpatient PT. No needs from CM.   Anticipated DC Date:  02/25/2012   Anticipated DC Plan:  HOME/SELF CARE      DC Planning Services  CM consult      Choice offered to / List presented to:             Status of service:   Medicare Important Message given?   (If response is "NO", the following Medicare IM given date fields will be blank) Date Medicare IM given:   Date Additional Medicare IM given:    Discharge Disposition:  HOME/SELF CARE  Per UR Regulation:    If discussed at Long Length of Stay Meetings, dates discussed:    Comments:

## 2012-02-26 LAB — URINE CULTURE

## 2012-02-29 ENCOUNTER — Ambulatory Visit (INDEPENDENT_AMBULATORY_CARE_PROVIDER_SITE_OTHER): Payer: Medicare Other

## 2012-02-29 DIAGNOSIS — I4891 Unspecified atrial fibrillation: Secondary | ICD-10-CM

## 2012-02-29 DIAGNOSIS — Z7901 Long term (current) use of anticoagulants: Secondary | ICD-10-CM

## 2012-02-29 LAB — POCT INR: INR: 1.5

## 2012-03-12 ENCOUNTER — Ambulatory Visit (INDEPENDENT_AMBULATORY_CARE_PROVIDER_SITE_OTHER): Payer: Medicare Other | Admitting: Pharmacist

## 2012-03-12 DIAGNOSIS — Z7901 Long term (current) use of anticoagulants: Secondary | ICD-10-CM

## 2012-03-12 DIAGNOSIS — I4891 Unspecified atrial fibrillation: Secondary | ICD-10-CM

## 2012-03-28 ENCOUNTER — Ambulatory Visit (INDEPENDENT_AMBULATORY_CARE_PROVIDER_SITE_OTHER): Payer: Medicare Other

## 2012-03-28 ENCOUNTER — Other Ambulatory Visit: Payer: Self-pay

## 2012-03-28 DIAGNOSIS — Z7901 Long term (current) use of anticoagulants: Secondary | ICD-10-CM

## 2012-03-28 DIAGNOSIS — I4891 Unspecified atrial fibrillation: Secondary | ICD-10-CM

## 2012-03-28 LAB — POCT INR: INR: 1.4

## 2012-03-28 MED ORDER — WARFARIN SODIUM 5 MG PO TABS: ORAL_TABLET | ORAL | Status: DC

## 2012-04-08 ENCOUNTER — Ambulatory Visit (INDEPENDENT_AMBULATORY_CARE_PROVIDER_SITE_OTHER): Payer: Medicare Other | Admitting: *Deleted

## 2012-04-08 DIAGNOSIS — I4891 Unspecified atrial fibrillation: Secondary | ICD-10-CM

## 2012-04-08 DIAGNOSIS — Z7901 Long term (current) use of anticoagulants: Secondary | ICD-10-CM

## 2012-04-08 LAB — POCT INR: INR: 1.7

## 2012-04-21 ENCOUNTER — Other Ambulatory Visit: Payer: Self-pay | Admitting: *Deleted

## 2012-04-21 MED ORDER — WARFARIN SODIUM 3 MG PO TABS: ORAL_TABLET | ORAL | Status: AC

## 2012-04-23 ENCOUNTER — Ambulatory Visit (INDEPENDENT_AMBULATORY_CARE_PROVIDER_SITE_OTHER): Payer: Medicare Other | Admitting: *Deleted

## 2012-04-23 DIAGNOSIS — I4891 Unspecified atrial fibrillation: Secondary | ICD-10-CM

## 2012-04-23 DIAGNOSIS — Z7901 Long term (current) use of anticoagulants: Secondary | ICD-10-CM

## 2012-05-06 IMAGING — CR DG CHEST 2V
2 series · 2 of 2 positions shown · non-contrast
Comparison: 06/09/2011

CLINICAL DATA: CABG.

CHEST - 2 VIEW

[w chest pa]
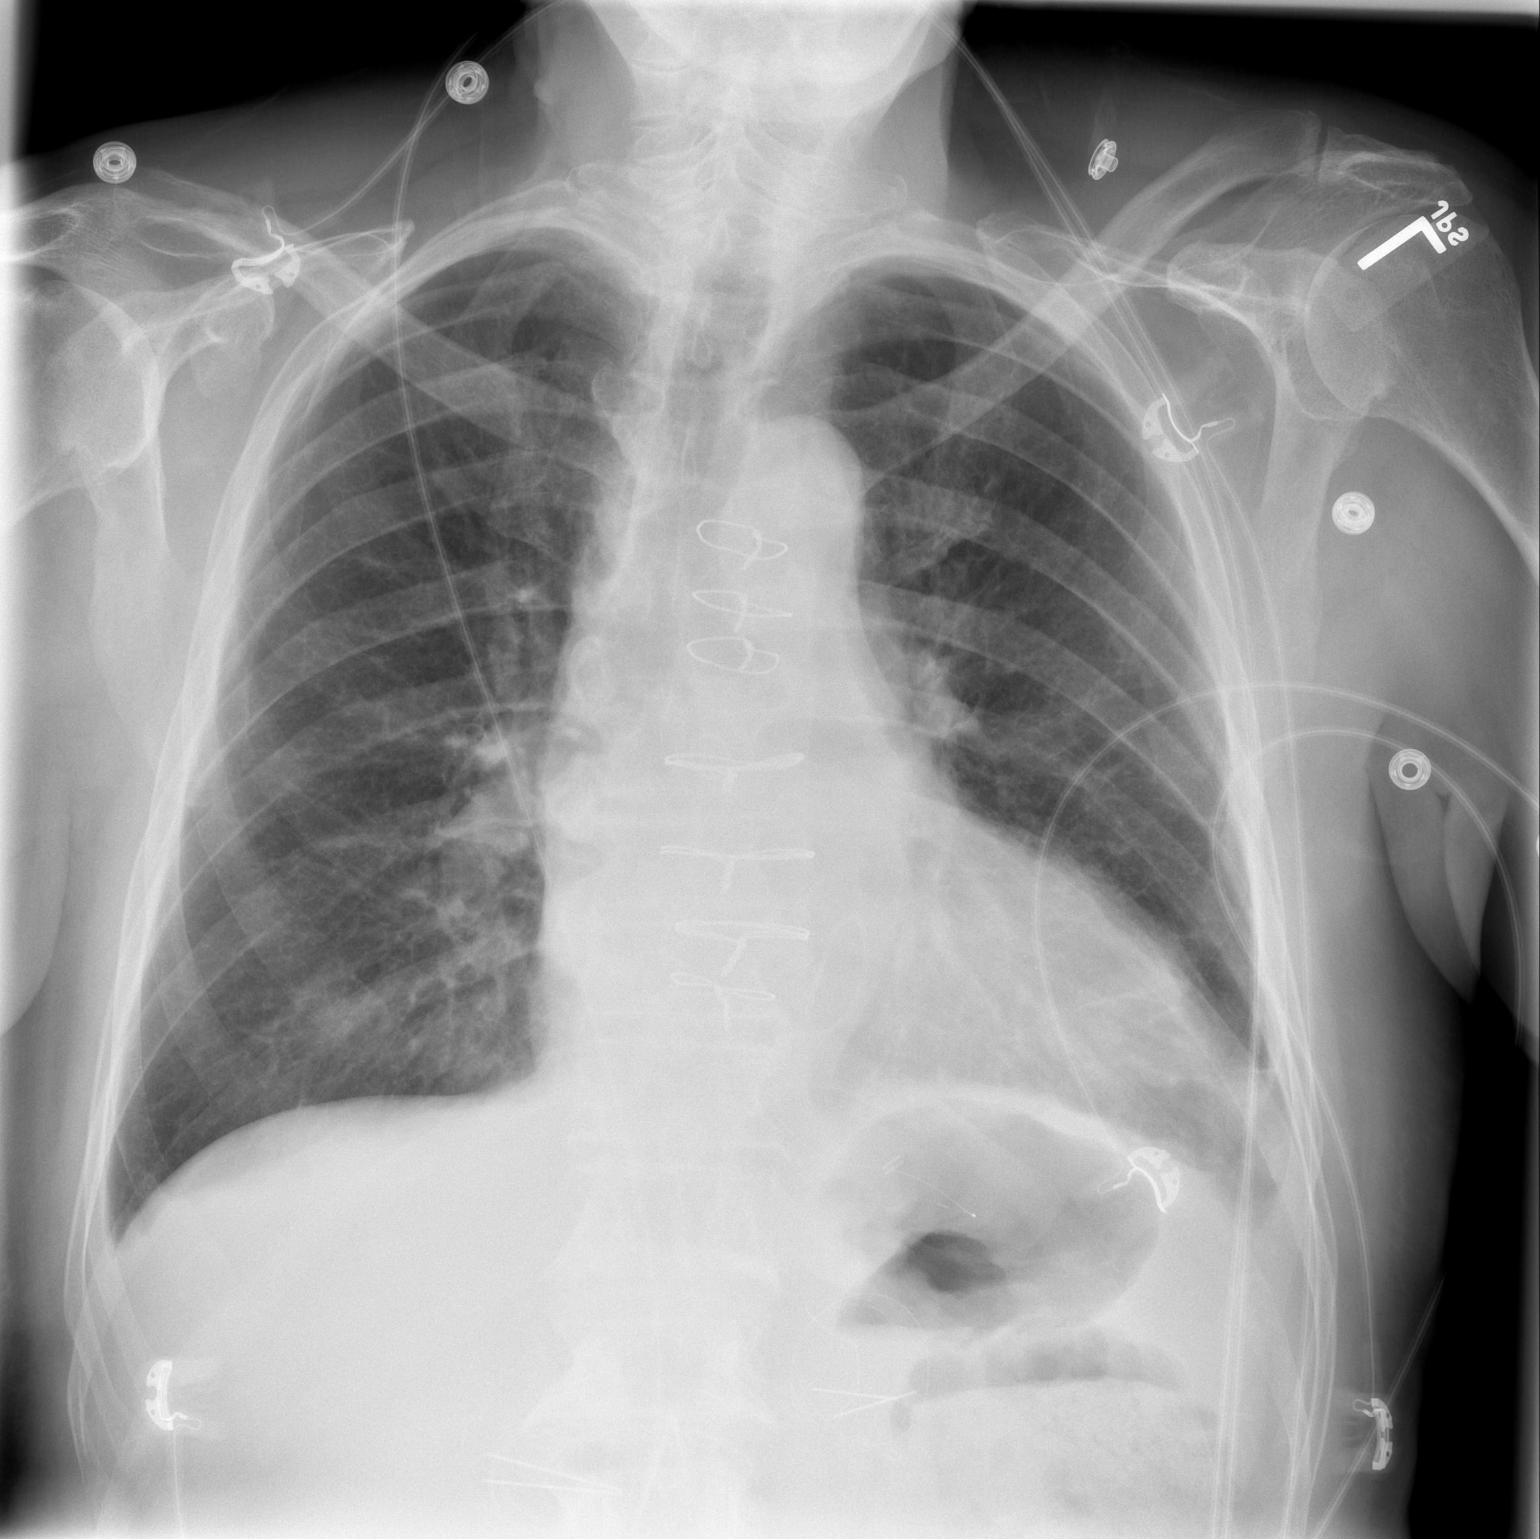

[w chest lat]
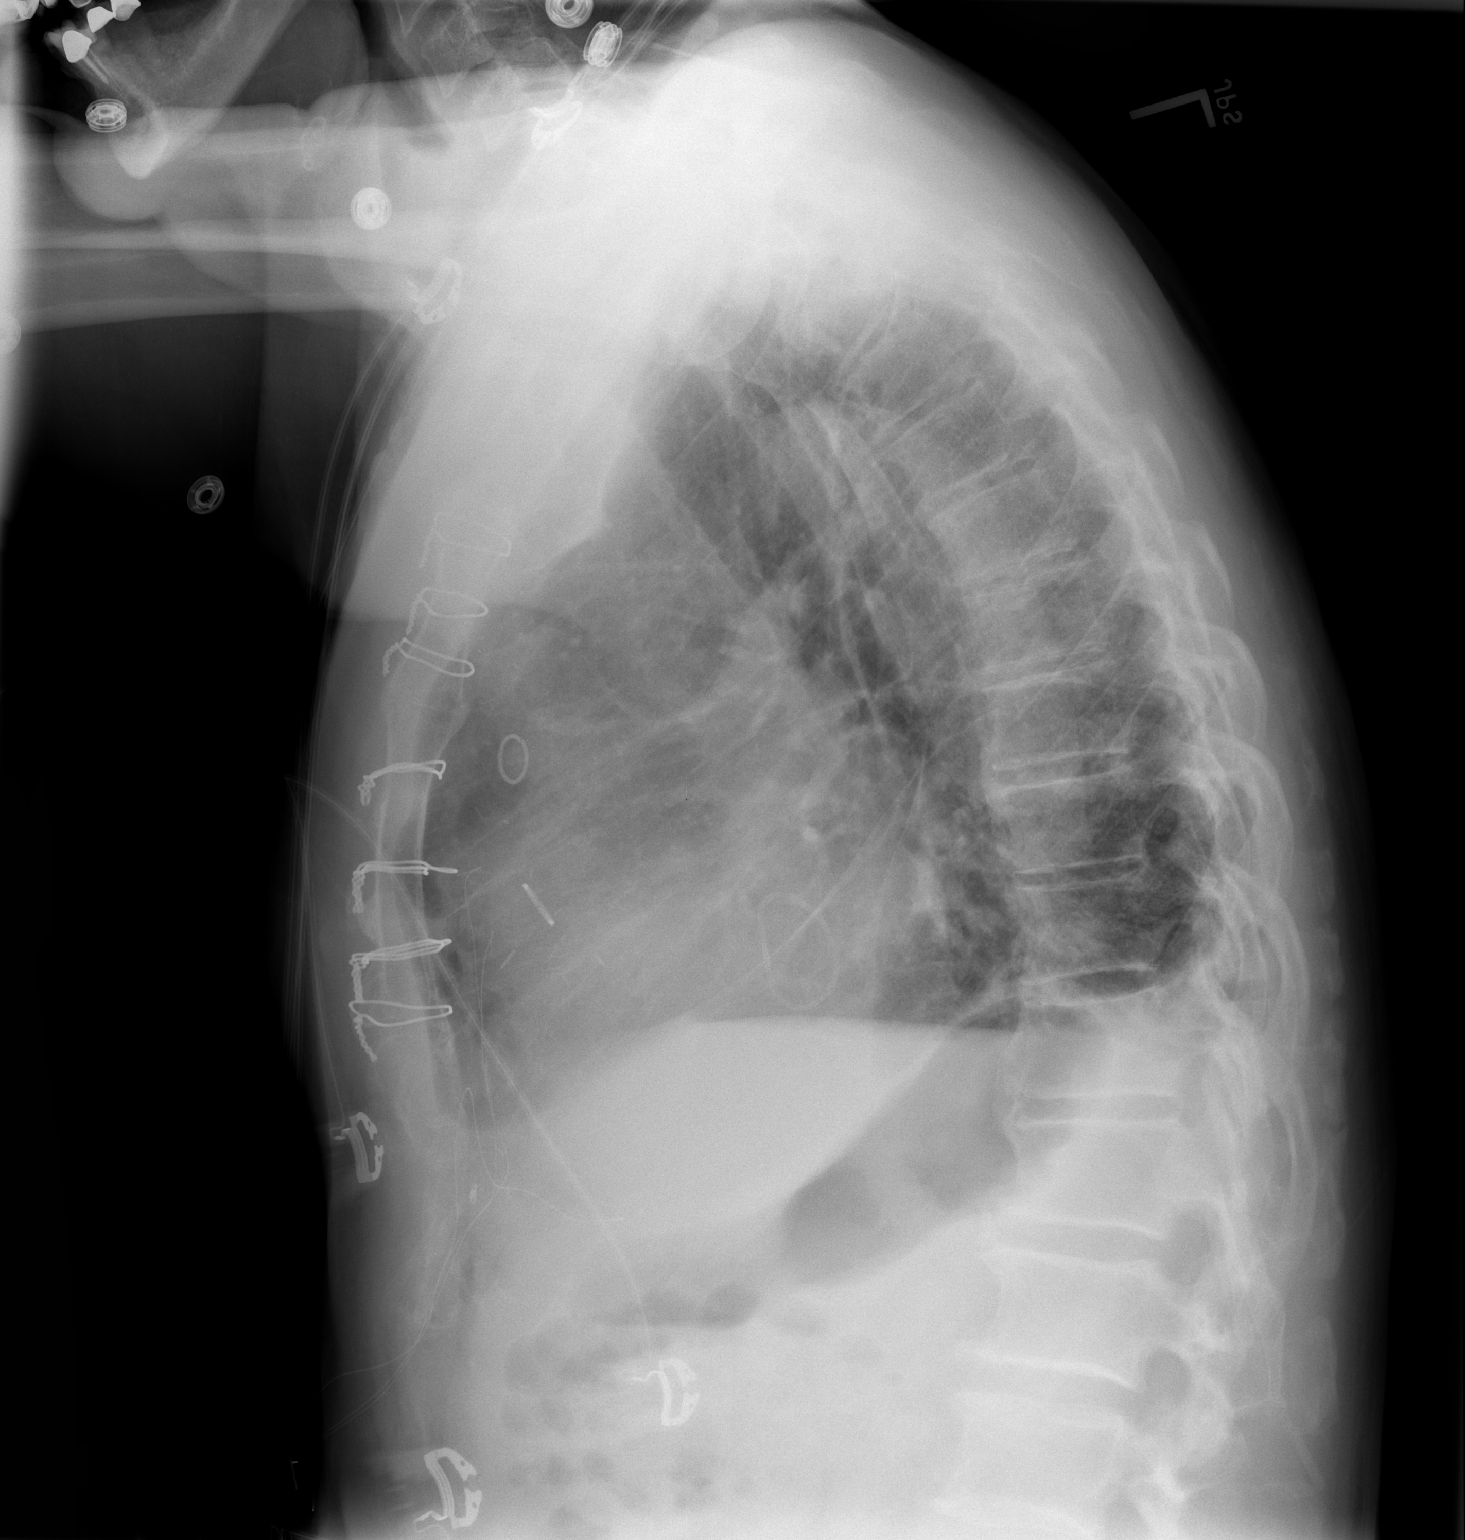

[2 of 2 positions shown; findings below may reference images not displayed]

FINDINGS: Prior median sternotomy.  Mild cardiomegaly.  Small left
pleural effusion.  Improving aeration in the lungs with decreasing
edema and bibasilar opacities.  Minimal left basilar opacity
persists, likely atelectasis.
IMPRESSION: Near complete resolution of bilateral infiltrates with minimal
residual left base atelectasis and small left effusion.

## 2012-05-14 ENCOUNTER — Ambulatory Visit (INDEPENDENT_AMBULATORY_CARE_PROVIDER_SITE_OTHER): Payer: Medicare Other | Admitting: *Deleted

## 2012-05-14 DIAGNOSIS — Z7901 Long term (current) use of anticoagulants: Secondary | ICD-10-CM

## 2012-05-14 DIAGNOSIS — I4891 Unspecified atrial fibrillation: Secondary | ICD-10-CM

## 2012-05-23 IMAGING — CR DG CHEST 1V PORT
1 series · 1 of 1 positions shown · non-contrast
Comparison: 06/19/2011

CLINICAL DATA: Altered mental status.  Weakness.  Atrial
fibrillation.  Coronary artery disease peri

PORTABLE CHEST - 1 VIEW

[AP]
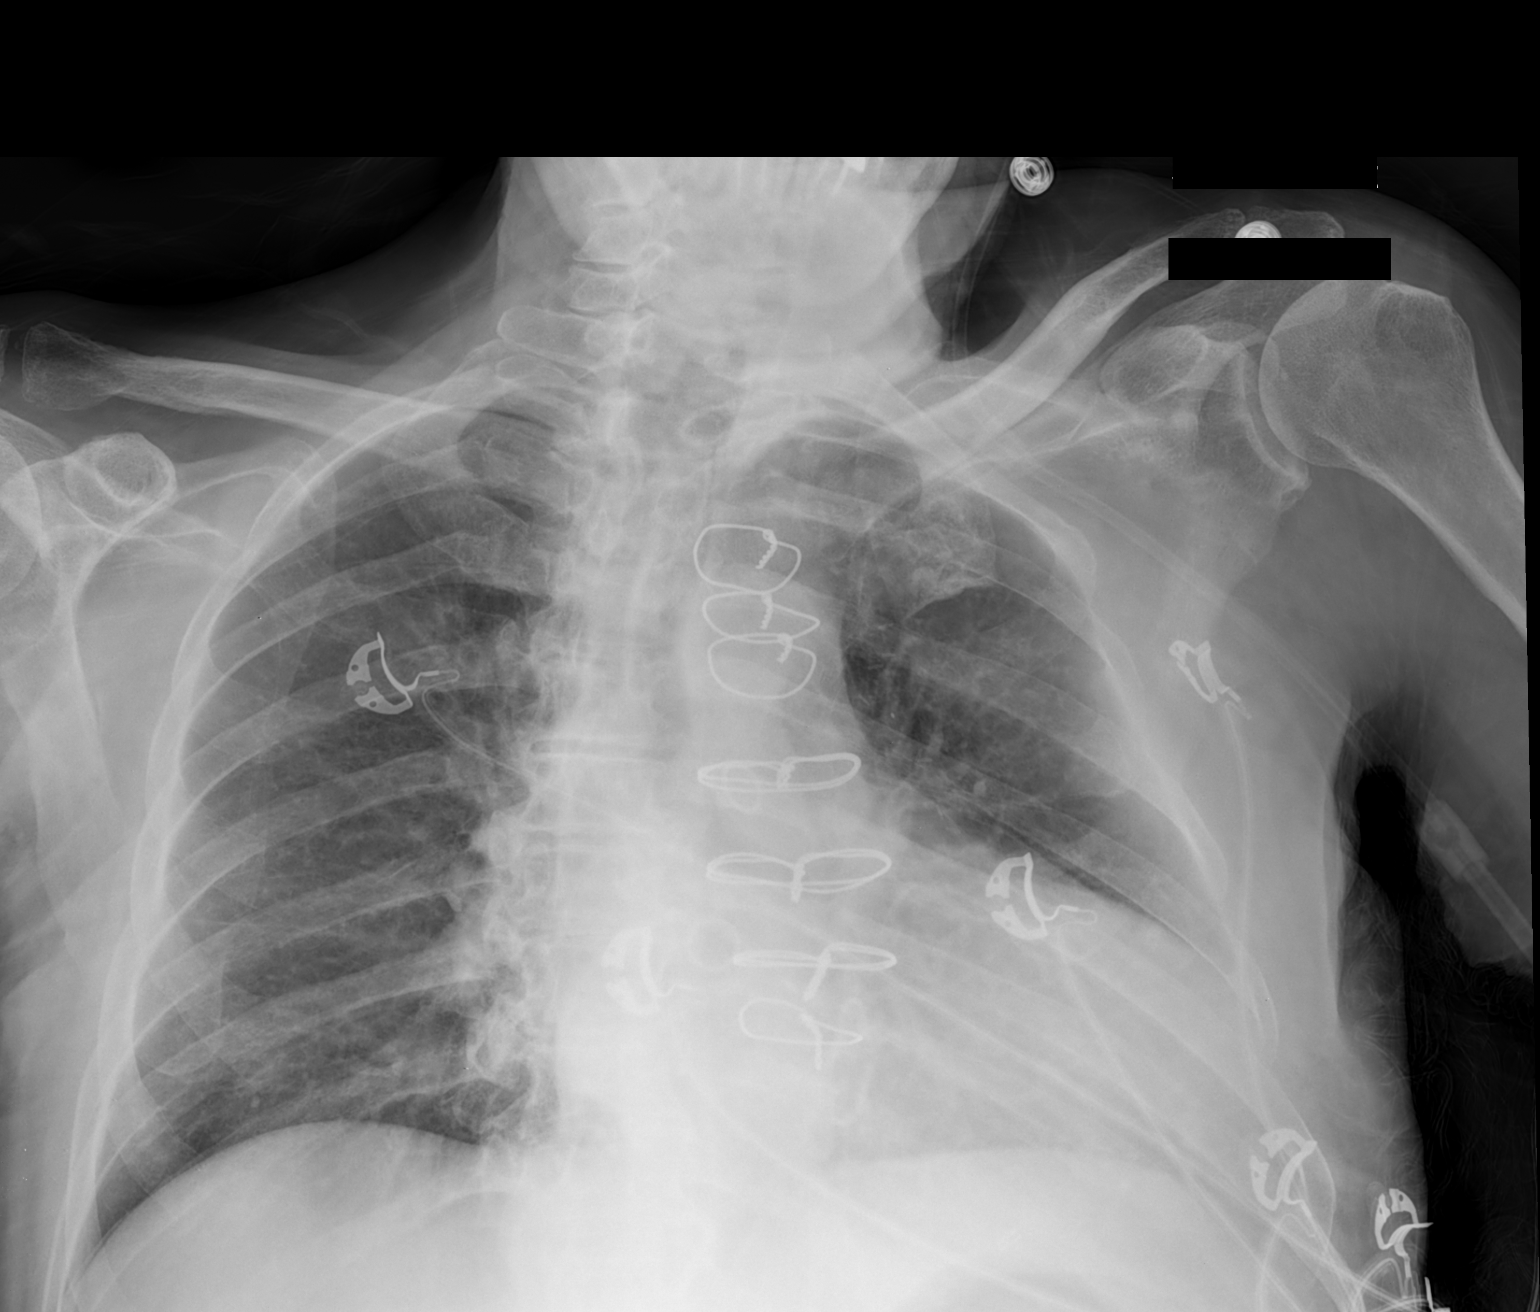

[1 of 1 positions shown; findings below may reference images not displayed]

FINDINGS: Low lung volumes are seen.  Patient is partially rotated.
Cardiomegaly stable.  Both lungs are clear.  The patient has
undergone previous CABG and mitral valve replacement.
IMPRESSION: Stable cardiomegaly and low lung volumes.  No acute findings.

## 2012-06-17 ENCOUNTER — Other Ambulatory Visit: Payer: Self-pay | Admitting: *Deleted

## 2012-06-17 NOTE — Telephone Encounter (Signed)
Opened in Error.

## 2012-06-18 ENCOUNTER — Other Ambulatory Visit: Payer: Self-pay | Admitting: *Deleted

## 2012-06-18 MED ORDER — ATORVASTATIN CALCIUM 40 MG PO TABS: 40.0000 mg | ORAL_TABLET | Freq: Every day | ORAL | Status: DC

## 2012-06-26 IMAGING — CR DG CHEST 2V
2 series · 2 of 2 positions shown · non-contrast
Comparison: 06/29/2011

CLINICAL DATA: CABG

CHEST - 2 VIEW

[w chest pa]
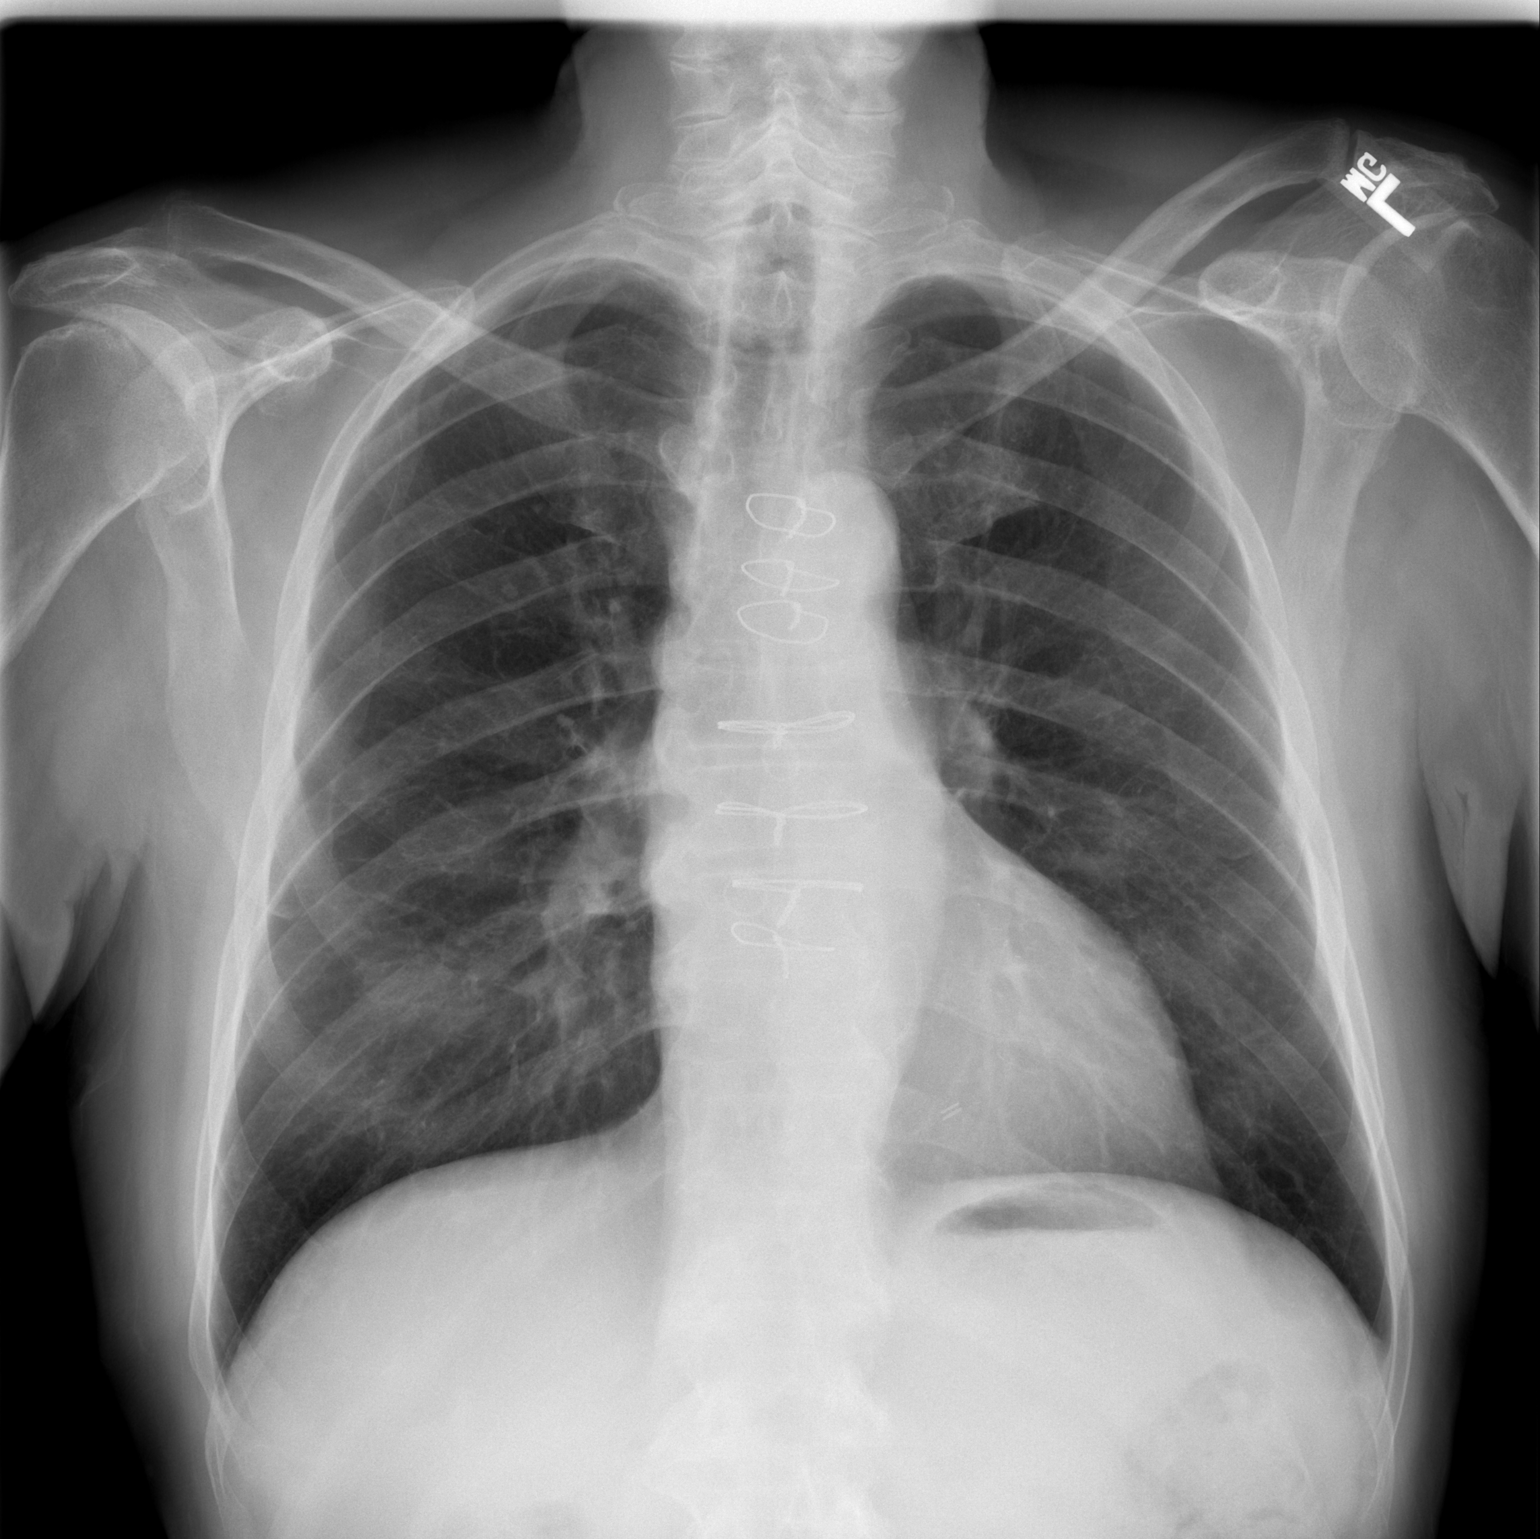

[w chest lat]
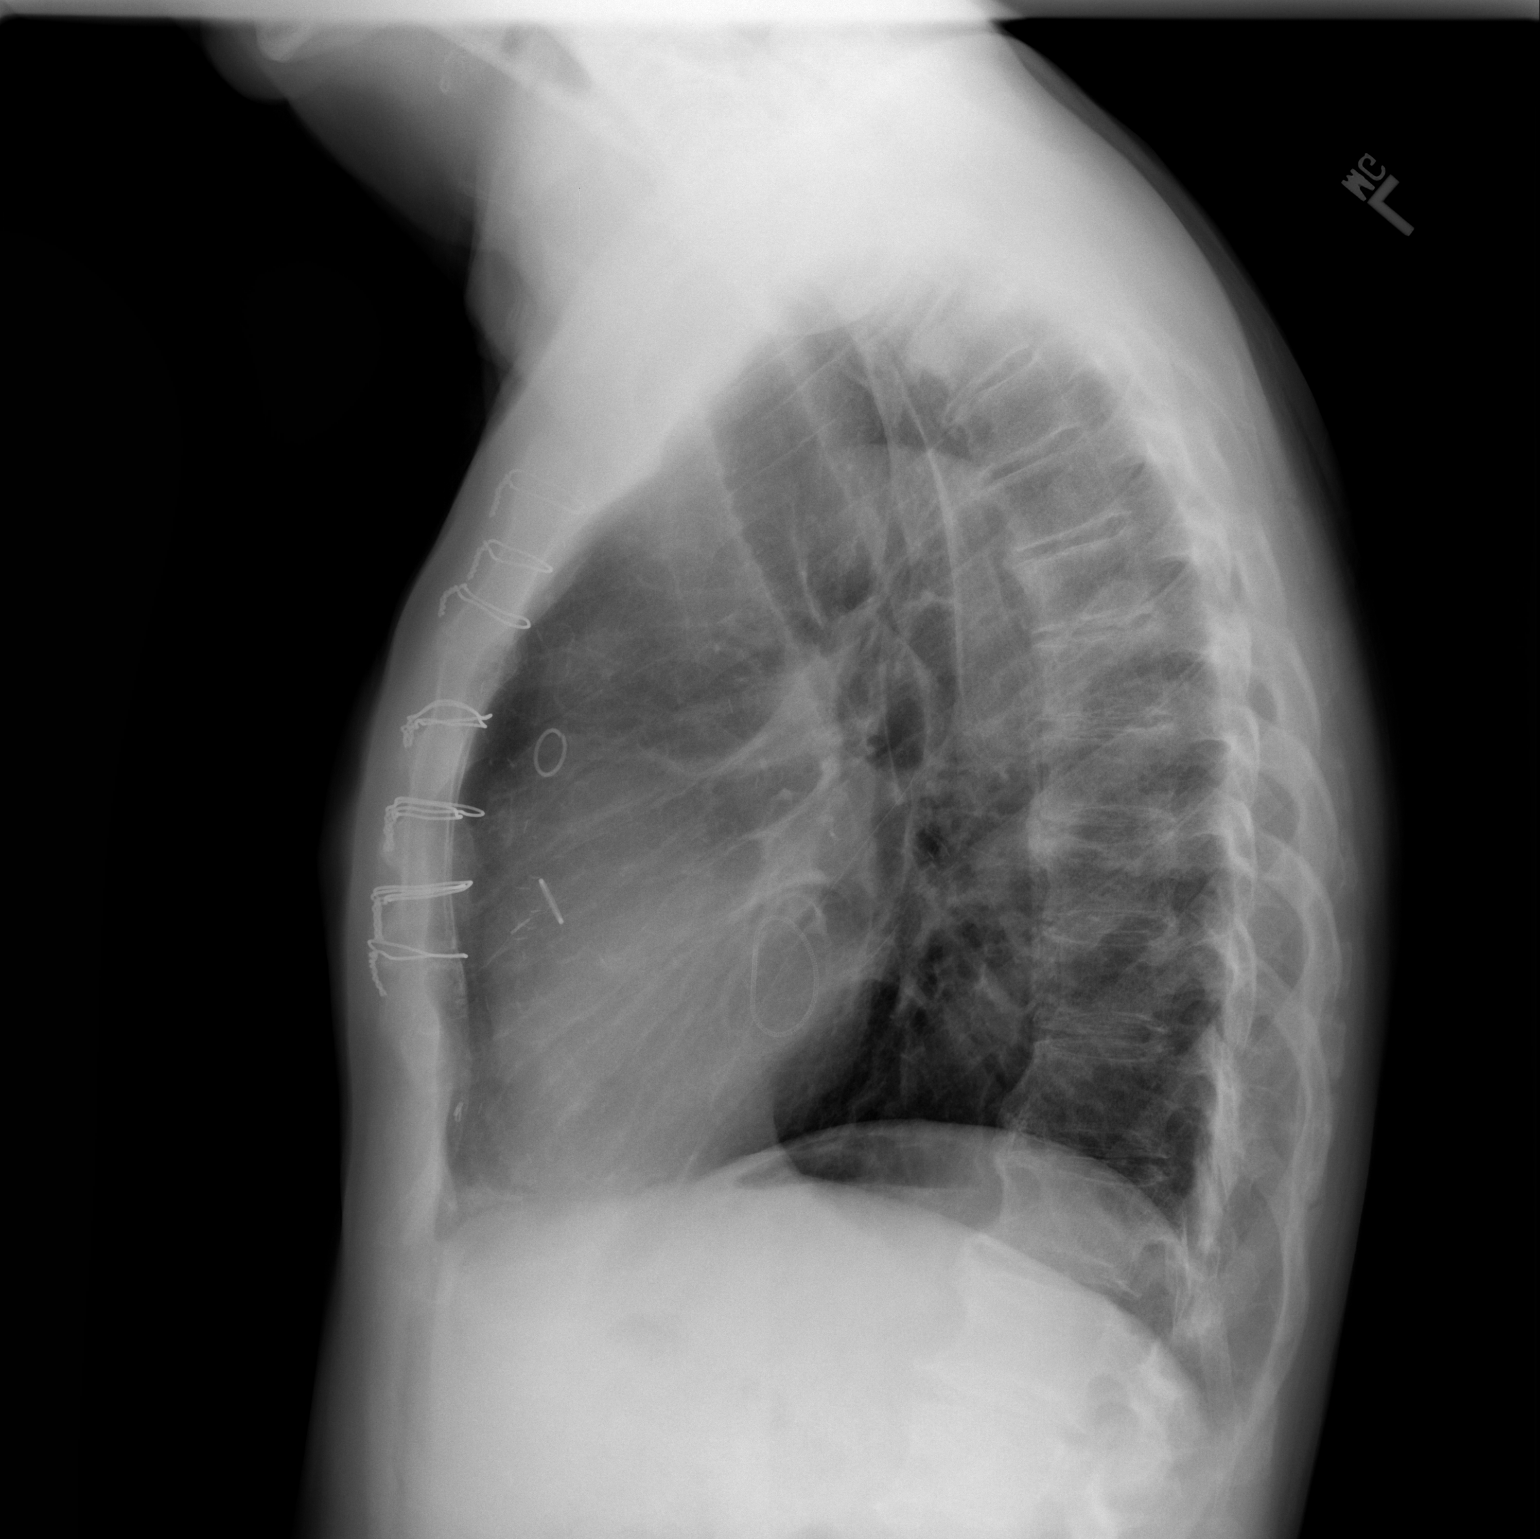

[2 of 2 positions shown; findings below may reference images not displayed]

FINDINGS: Normal heart size.  Clear lungs.
IMPRESSION: No active cardiopulmonary disease.

## 2012-09-08 ENCOUNTER — Other Ambulatory Visit: Payer: Self-pay | Admitting: Cardiology

## 2012-09-19 ENCOUNTER — Ambulatory Visit: Payer: Self-pay | Admitting: Pharmacist

## 2012-09-19 DIAGNOSIS — Z7901 Long term (current) use of anticoagulants: Secondary | ICD-10-CM

## 2012-09-19 DIAGNOSIS — I4891 Unspecified atrial fibrillation: Secondary | ICD-10-CM

## 2013-01-13 ENCOUNTER — Encounter: Payer: Self-pay | Admitting: Nurse Practitioner

## 2013-01-13 ENCOUNTER — Ambulatory Visit (INDEPENDENT_AMBULATORY_CARE_PROVIDER_SITE_OTHER): Payer: Medicare Other | Admitting: Nurse Practitioner

## 2013-01-13 VITALS — BP 120/80 | HR 56 | Resp 20 | Ht 72.0 in | Wt 174.4 lb

## 2013-01-13 DIAGNOSIS — I519 Heart disease, unspecified: Secondary | ICD-10-CM

## 2013-01-13 DIAGNOSIS — Z9889 Other specified postprocedural states: Secondary | ICD-10-CM

## 2013-01-13 LAB — BASIC METABOLIC PANEL
BUN: 17 mg/dL (ref 6–23)
CO2: 28 mEq/L (ref 19–32)
Calcium: 9.3 mg/dL (ref 8.4–10.5)
Chloride: 104 mEq/L (ref 96–112)
Creatinine, Ser: 1.7 mg/dL — ABNORMAL HIGH (ref 0.4–1.5)
GFR: 41.99 mL/min — ABNORMAL LOW (ref >60.00)
Glucose, Bld: 176 mg/dL — ABNORMAL HIGH (ref 70–99)
Potassium: 3.3 mEq/L — ABNORMAL LOW (ref 3.5–5.1)
Sodium: 140 mEq/L (ref 135–145)

## 2013-01-13 MED ORDER — CARVEDILOL 12.5 MG PO TABS: ORAL_TABLET | ORAL | Status: DC

## 2013-01-13 NOTE — Progress Notes (Signed)
[name] Date of Birth: 1936-01-30 Medical Record #[id number]  History of Present Illness: Mr. Skillin is seen back today for a one year check. Seen for Dr. Swaziland. Has a history of HTN, atrial fib with prior MAZE, BPH with prior TURP, chronic anticoagulation, CAD with past CABG which include LIMA to LAD, SVG to intermediate, and SVG to distal RCA with his MAZE and MV repair, CKD, and past syncope related to heat exposure. Last echo was in January of 2013 - has mild to moderate LV dysfunction.   Last seen here last August. Cardiac status seemed stable. He had had an extensive neuro work up and was put on Aricept but not felt to have Alzheimer's.   Comes in today. Here with his wife. She provides a lot of the history. They haven't been here in almost a year. He has been doing ok for the most part. Still with no energy. Sleeping too much. Can't remember things but still able to perform his ADL's. No chest pain. Not short of breath. No swelling. Tolerating his medicines. Coumadin is followed by Dr. Kevan Ny.   Current Outpatient Prescriptions  Medication Sig Dispense Refill  . atorvastatin (LIPITOR) 40 MG tablet Take 1 tablet (40 mg total) by mouth daily after breakfast.  30 tablet  6  . carvedilol (COREG) 12.5 MG tablet Take one half tablet twice daily  60 tablet  11  . digoxin (LANOXIN) 0.25 MG tablet Take 0.125 mg by mouth daily after breakfast.       . donepezil (ARICEPT) 10 MG tablet Take 10 mg by mouth at bedtime.      . dutasteride (AVODART) 0.5 MG capsule Take 0.5 mg by mouth daily. EVERY AM      . fish oil-omega-3 fatty acids 1000 MG capsule Take 1 g by mouth daily.      Marland Kitchen lisinopril (PRINIVIL,ZESTRIL) 20 MG tablet Take 10 mg by mouth daily. Hold lisinopril for now      . potassium chloride (K-DUR,KLOR-CON) 10 MEQ tablet Take 10 mEq by mouth 2 (two) times daily.       . vitamin B-12 (CYANOCOBALAMIN) 1000 MCG tablet Take 1,000 mcg by mouth daily.      Marland Kitchen warfarin (COUMADIN) 3 MG tablet  Take as directed by coumadin clinic  40 tablet  3   No current facility-administered medications for this visit.    No Known Allergies  Past Medical History  Diagnosis Date  . Hypertension   . Atrial fibrillation 06/2011    S/p left sided MAZE. On Coumadin, January, 2013  . Syncope     Near-syncope, etiology is deemed secondary to the excessive heat  . Benign prostatic hypertrophy     but stable  . Chronic anticoagulation   . Coronary artery disease 06/2011    S/p LIMA-LAD, SVG-intermediate coronary (?), and SVG-distal RCA with concurrent MV repair and MAZE  . Mitral regurgitation 06/2011    Mitral valve ring annuloplasty at the time of CABG,, January, 2013  . Shortness of breath   . Chronic kidney disease 12/2010    with a creatinine on discharge of 1.84, was  2.1 on admission  . Calculus of kidney   . H/O hiatal hernia   . Ejection fraction < 50%     EF previously 50%, however 20-25% by echo and catheterization before CABG  . Chronic kidney disease   . Foley catheter in place     Past Surgical History  Procedure Laterality Date  . Circumcision  08/12/2002    because of phimosis  . Maze  06/06/2011    Procedure: MAZE;  Surgeon: [name], MD;  Location: [facility];  Service: Open Heart Surgery;  Laterality: N/A;  . Mitral valve repair  06/06/2011    Procedure: MITRAL VALVE REPAIR (MVR);  Surgeon: [name], MD;  Location: [facility] OR;  Service: Open Heart Surgery;  Laterality: N/A;  . Coronary artery bypass graft  06/06/2011     grafts times three using left internal mammary artery and right leg greater saphenous vein harvested endoscopically  . Saturation biopsy of prostate    . Lithotripsy    . Transurethral resection of prostate  02/13/2012    Procedure: TRANSURETHRAL RESECTION OF THE PROSTATE WITH GYRUS INSTRUMENTS;  Surgeon: Milford Cage, MD;  Location: [facility] ORS;  Service: Urology;  Laterality: N/A;  . Cystoscopy  02/13/2012    Procedure: CYSTOSCOPY;  Surgeon:  Milford Cage, MD;  Location: [facility] ORS;  Service: Urology;  Laterality: N/A;    History  Smoking status  . Former Smoker -- 45 years  . Types: Cigarettes  . Quit date: 07/01/1964  Smokeless tobacco  . Former Neurosurgeon  . Types: Chew    History  Alcohol Use No    Family History  Problem Relation Age of Onset  . Cancer Father   . Lung cancer Father   . Diabetes Father   . Hypertension Mother   . Hypertension Brother     Review of Systems: The review of systems is per the HPI.  All other systems were reviewed and are negative.  Physical Exam: BP 120/80  Pulse 56  Resp 20  Ht 6' (1.829 m)  Wt 174 lb 6.4 oz (79.107 kg)  BMI 23.65 kg/m2 Patient is very pleasant and in no acute distress. Skin is warm and dry. Color is normal.  HEENT is unremarkable. Normocephalic/atraumatic. PERRL. Sclera are nonicteric. Neck is supple. No masses. No JVD. Lungs are clear. Cardiac exam shows a fairly regular rate and rhythm. Soft systolic murmur. Abdomen is soft. Extremities are without edema. Gait and ROM are intact. No gross neurologic deficits noted.  LABORATORY DATA: BMET and EKG pending   Lab Results  Component Value Date   WBC 7.7 02/25/2012   HGB 12.7* 02/25/2012   HCT 37.4* 02/25/2012   PLT 207 02/25/2012   GLUCOSE 124* 02/25/2012   CHOL 142 09/21/2011   TRIG 199.0* 09/21/2011   HDL 32.50* 09/21/2011   LDLCALC 70 09/21/2011   ALT 12 02/24/2012   AST 19 02/24/2012   NA 140 02/25/2012   K 3.4* 02/25/2012   CL 102 02/25/2012   CREATININE 1.78* 02/25/2012   BUN 23 02/25/2012   CO2 26 02/25/2012   TSH 1.51 09/24/2011   PSA 3.77 06/04/2011   INR 2.2 05/14/2012   HGBA1C 5.5 09/24/2011   Echo Study Conclusions from January 2013  - Left ventricle: Hypokinesis worse in the septum and apex The cavity size was moderately dilated. Wall thickness was increased in a pattern of mild LVH. Systolic function was moderately reduced. The estimated ejection fraction was in the range of 35% to 40%.  Diffuse hypokinesis. - Aortic valve: Mildly calcified annulus. There was mild stenosis. - Mitral valve: S/P mitral valve repair with no regurgitation or stenosis Valve area by continuity equation (using LVOT flow): 1.59cm^2. - Left atrium: The atrium was moderately dilated. - Atrial septum: No defect or patent foramen ovale was identified.     Assessment / Plan: 1.  CAD - past CABG - no chest pain reported.   2. MV repair - no follow up echo and has had documented LV dysfunction - wife would like this updated to make sure there is not a cardiac etiology for his fatigue.   3. Atrial fib - checking EKG today. Remains on coumadin. Rate is slow on exam today. EKG shows him to be in sinus with a rate of 53.   4. Dementia   I have refilled the Coreg today. Check some baseline labs. Update the echo. Tentatively see him back in a year.   Patient is agreeable to this plan and will call if any problems develop in the interim.   Rosalio Macadamia, RN, ANP-C [facility] [address] [place], [place]  [address]

## 2013-01-13 NOTE — Patient Instructions (Addendum)
We will check an EKG today  We will get an ultrasound of your heart  We will check labs today  See Dr. Swaziland in 12 months  I have refilled the Coreg today  Stop the Digoxin - this may help his HR come up  Call the [facility] office at [phone] if you have any questions, problems or concerns.

## 2013-01-14 ENCOUNTER — Other Ambulatory Visit: Payer: Self-pay | Admitting: *Deleted

## 2013-01-14 DIAGNOSIS — E876 Hypokalemia: Secondary | ICD-10-CM

## 2013-01-14 LAB — DIGOXIN LEVEL: Digoxin Level: 0.9 ng/mL (ref 0.8–2.0)

## 2013-01-14 MED ORDER — POTASSIUM CHLORIDE CRYS ER 20 MEQ PO TBCR: 20.0000 meq | EXTENDED_RELEASE_TABLET | Freq: Two times a day (BID) | ORAL | Status: DC

## 2013-01-20 ENCOUNTER — Ambulatory Visit (HOSPITAL_COMMUNITY): Payer: Medicare Other | Attending: Nurse Practitioner

## 2013-01-20 ENCOUNTER — Other Ambulatory Visit (INDEPENDENT_AMBULATORY_CARE_PROVIDER_SITE_OTHER): Payer: Medicare Other

## 2013-01-20 DIAGNOSIS — Z9889 Other specified postprocedural states: Secondary | ICD-10-CM | POA: Insufficient documentation

## 2013-01-20 DIAGNOSIS — N189 Chronic kidney disease, unspecified: Secondary | ICD-10-CM | POA: Insufficient documentation

## 2013-01-20 DIAGNOSIS — E876 Hypokalemia: Secondary | ICD-10-CM

## 2013-01-20 DIAGNOSIS — I129 Hypertensive chronic kidney disease with stage 1 through stage 4 chronic kidney disease, or unspecified chronic kidney disease: Secondary | ICD-10-CM | POA: Insufficient documentation

## 2013-01-20 DIAGNOSIS — I059 Rheumatic mitral valve disease, unspecified: Secondary | ICD-10-CM

## 2013-01-20 DIAGNOSIS — R55 Syncope and collapse: Secondary | ICD-10-CM | POA: Insufficient documentation

## 2013-01-20 DIAGNOSIS — F17200 Nicotine dependence, unspecified, uncomplicated: Secondary | ICD-10-CM | POA: Insufficient documentation

## 2013-01-20 DIAGNOSIS — I519 Heart disease, unspecified: Secondary | ICD-10-CM

## 2013-01-20 DIAGNOSIS — I2510 Atherosclerotic heart disease of native coronary artery without angina pectoris: Secondary | ICD-10-CM | POA: Insufficient documentation

## 2013-01-20 DIAGNOSIS — I4891 Unspecified atrial fibrillation: Secondary | ICD-10-CM | POA: Insufficient documentation

## 2013-01-20 DIAGNOSIS — I359 Nonrheumatic aortic valve disorder, unspecified: Secondary | ICD-10-CM | POA: Insufficient documentation

## 2013-01-20 DIAGNOSIS — I079 Rheumatic tricuspid valve disease, unspecified: Secondary | ICD-10-CM | POA: Insufficient documentation

## 2013-01-20 LAB — BASIC METABOLIC PANEL
BUN: 17 mg/dL (ref 6–23)
CO2: 28 mEq/L (ref 19–32)
Calcium: 9.5 mg/dL (ref 8.4–10.5)
Chloride: 102 mEq/L (ref 96–112)
Creatinine, Ser: 1.8 mg/dL — ABNORMAL HIGH (ref 0.4–1.5)
GFR: 38.32 mL/min — ABNORMAL LOW (ref >60.00)
Glucose, Bld: 150 mg/dL — ABNORMAL HIGH (ref 70–99)
Potassium: 4 mEq/L (ref 3.5–5.1)
Sodium: 137 mEq/L (ref 135–145)

## 2013-01-20 NOTE — Progress Notes (Signed)
Echocardiogram performed.  

## 2013-08-10 ENCOUNTER — Telehealth: Payer: Self-pay | Admitting: Cardiology

## 2013-08-10 NOTE — Telephone Encounter (Signed)
Returned call to patient's wife no answer.LMTC. 

## 2013-08-10 NOTE — Telephone Encounter (Signed)
New problem   Pt is feeling really tired per wife calling. She is very concern about him. Would like to speak to nurse concerning his condition.

## 2013-08-12 ENCOUNTER — Telehealth: Payer: Self-pay | Admitting: Cardiology

## 2013-08-12 NOTE — Telephone Encounter (Signed)
Returned call to patient's wife she stated husband has been feeling tired,no energy.Stated having occasional chest pain.Advised Dr.Jordan out of office this week,will check schedules and call back with appointment.

## 2013-08-12 NOTE — Telephone Encounter (Signed)
New message          Pt c/o sensation on left side

## 2013-08-12 NOTE — Telephone Encounter (Signed)
See previous 08/12/13 note.

## 2013-08-18 NOTE — Telephone Encounter (Signed)
Received message Dr.Stoneking request patient to be seen this week for chronic chest pain.Appointment scheduled with Norma FredricksonLori Gerhardt NP tomorrow 08/19/13 at 3:00 pm.

## 2013-08-19 ENCOUNTER — Ambulatory Visit (INDEPENDENT_AMBULATORY_CARE_PROVIDER_SITE_OTHER): Payer: Medicare Other | Admitting: Nurse Practitioner

## 2013-08-19 ENCOUNTER — Encounter: Payer: Self-pay | Admitting: Nurse Practitioner

## 2013-08-19 VITALS — BP 110/70 | HR 61 | Ht 72.0 in | Wt 189.8 lb

## 2013-08-19 DIAGNOSIS — R413 Other amnesia: Secondary | ICD-10-CM

## 2013-08-19 DIAGNOSIS — I4891 Unspecified atrial fibrillation: Secondary | ICD-10-CM

## 2013-08-19 DIAGNOSIS — I2510 Atherosclerotic heart disease of native coronary artery without angina pectoris: Secondary | ICD-10-CM

## 2013-08-19 MED ORDER — CARVEDILOL 3.125 MG PO TABS: 3.1250 mg | ORAL_TABLET | Freq: Two times a day (BID) | ORAL | Status: DC

## 2013-08-19 NOTE — Patient Instructions (Signed)
Cut the Coreg back to 3.125 mg two times a day - this is at the drugstore  Monitor the blood pressure a little more frequently  We will check lab today - hope to cut back the potassium back after I review  I will see him in a month  Call the [facility] office at [phone] if you have any questions, problems or concerns.

## 2013-08-19 NOTE — Progress Notes (Signed)
Troy Gregory Date of Birth: Jan 18, 1936 Medical Record #[id number]  History of Present Illness: [name] is seen back today for a work in visit. Seen for Dr. Swaziland. He is a 78 year old male with a history of HTN, atrial fib with prior MAZE (January 2013), BPH with prior TURP, chronic anticoagulation, CAD with past CABG which include LIMA to LAD, SVG to intermediate, and SVG to distal RCA with his MAZE and MV repair in January of 2013 per EBG, CKD, and past syncope related to heat exposure. Prior echo from January of 2013 - had mild to moderate LV dysfunction this improved on echo from August of 2014.   Last seen here last August of 2013.  Cardiac status seemed stable. He had had an extensive neuro work up and was put on Aricept but not felt to have Alzheimer's.   I saw him last August of 2014 -  Wife provided most of the history due to memory issues. Had no energy. Sleeping too much. We did update his echo - EF had improved.   Referred back here by Dr. Pete Glatter for Dr. Kevan Ny for chronic chest pain -  Mr. Maneri apparently denied chest pain but wife noted that he was having chest pain. Story quite difficult to obtain given the memory issues. Remained fatigue - but that has been chronic. Referred back here for further evaluation. Thus added to my schedule.  Comes back today. Here with his wife.  She notes that he has been doing about the same since I last saw him. He still has lots of issues with his memory. He is able to do all of his ADLs but wife manages his medicines - he has missed taking some but has never "double dosed" himself. He has not had any follow up from neurology. Not very active but very sedentary. He has gained weight - he likes to eat. No swelling. Continues to sleep a lot - or at least more than she thinks he should be. Not short of breath. Some dizziness if he stands up too quick. No passing out. Noted over the last 3 to 4 weeks that he has complained of his "heart hurting". She  has found him rubbing his chest. He is not able to tell me how it feels but says it will last for just a second or so.   Current Outpatient Prescriptions  Medication Sig Dispense Refill  . amLODipine (NORVASC) 5 MG tablet Take 5 mg by mouth daily.       Marland Kitchen atorvastatin (LIPITOR) 40 MG tablet Take 1 tablet (40 mg total) by mouth daily after breakfast.  30 tablet  6  . carvedilol (COREG) 12.5 MG tablet Take one half tablet twice daily  60 tablet  11  . donepezil (ARICEPT) 10 MG tablet Take 10 mg by mouth at bedtime.      . dutasteride (AVODART) 0.5 MG capsule Take 0.5 mg by mouth daily. EVERY AM      . fish oil-omega-3 fatty acids 1000 MG capsule Take 1 g by mouth daily.      Marland Kitchen lisinopril (PRINIVIL,ZESTRIL) 20 MG tablet Take 10 mg by mouth daily. Hold lisinopril for now      . potassium chloride (K-DUR,KLOR-CON) 20 MEQ tablet Take 1 tablet (20 mEq total) by mouth 2 (two) times daily.  60 tablet  6  . vitamin B-12 (CYANOCOBALAMIN) 1000 MCG tablet Take 1,000 mcg by mouth daily.      Marland Kitchen warfarin (COUMADIN) 3 MG tablet  Take as directed by coumadin clinic  40 tablet  3  . finasteride (PROSCAR) 5 MG tablet        No current facility-administered medications for this visit.    No Known Allergies  Past Medical History  Diagnosis Date  . Hypertension   . Atrial fibrillation 06/2011    S/p left sided MAZE. On Coumadin, January, 2013  . Syncope     Near-syncope, etiology is deemed secondary to the excessive heat  . Benign prostatic hypertrophy     but stable  . Chronic anticoagulation   . Coronary artery disease 06/2011    S/p LIMA-LAD, SVG-intermediate coronary (?), and SVG-distal RCA with concurrent MV repair and MAZE  . Mitral regurgitation 06/2011    Mitral valve ring annuloplasty at the time of CABG,, January, 2013  . Shortness of breath   . Chronic kidney disease 12/2010    with a creatinine on discharge of 1.84, was  2.1 on admission  . Calculus of kidney   . H/O hiatal hernia   .  Ejection fraction < 50%     EF previously 50%, however 20-25% by echo and catheterization before CABG  . Chronic kidney disease   . Foley catheter in place     Past Surgical History  Procedure Laterality Date  . Circumcision  08/12/2002    because of phimosis  . Maze  06/06/2011    Procedure: MAZE;  Surgeon: Delight OvensEdward B Max Nuno, MD;  [facility];  Service: Open Heart Surgery;  Laterality: N/A;  . Mitral valve repair  06/06/2011    Procedure: MITRAL VALVE REPAIR (MVR);  Surgeon: Delight OvensEdward B Tyron Manetta, MD;  [facility];  Service: Open Heart Surgery;  Laterality: N/A;  . Coronary artery bypass graft  06/06/2011     grafts times three using left internal mammary artery and right leg greater saphenous vein harvested endoscopically  . Saturation biopsy of prostate    . Lithotripsy    . Transurethral resection of prostate  02/13/2012    Procedure: TRANSURETHRAL RESECTION OF THE PROSTATE WITH GYRUS INSTRUMENTS;  Surgeon: Milford Cageaniel Young Woodruff, MD;  Location: [facility] ORS;  Service: Urology;  Laterality: N/A;  . Cystoscopy  02/13/2012    Procedure: CYSTOSCOPY;  Surgeon: Milford Cageaniel Young Woodruff, MD;  Location: [facility] ORS;  Service: Urology;  Laterality: N/A;    History  Smoking status  . Former Smoker -- 45 years  . Types: Cigarettes  . Quit date: 07/01/1964  Smokeless tobacco  . Former NeurosurgeonUser  . Types: Chew    History  Alcohol Use No    Family History  Problem Relation Age of Onset  . Cancer Father   . Lung cancer Father   . Diabetes Father   . Hypertension Mother   . Hypertension Brother     Review of Systems: The review of systems is per the HPI.  All other systems were reviewed and are negative.  Physical Exam: BP 110/70  Pulse 61  Ht 6' (1.829 m)  Wt 189 lb 12.8 oz (86.093 kg)  BMI 25.74 kg/m2 Patient is pleasant and in no acute distress. Skin is warm and dry. Color is normal.  HEENT is unremarkable. Normocephalic/atraumatic. PERRL. Sclera are nonicteric. Neck is supple. No masses. No  JVD. Lungs are clear. Cardiac exam shows a regular rate and rhythm. Abdomen is soft. Extremities are without edema. Gait and ROM are intact. No gross neurologic deficits noted.  Wt Readings from Last 3 Encounters:  08/19/13 189 lb 12.8 oz (86.093  kg)  01/13/13 174 lb 6.4 oz (79.107 kg)  02/25/12 168 lb 6.9 oz (76.4 kg)     LABORATORY DATA: EKG today shows sinus rhythm.  Lab Results  Component Value Date   WBC 7.7 02/25/2012   HGB 12.7* 02/25/2012   HCT 37.4* 02/25/2012   PLT 207 02/25/2012   GLUCOSE 150* 01/20/2013   CHOL 142 09/21/2011   TRIG 199.0* 09/21/2011   HDL 32.50* 09/21/2011   LDLCALC 70 09/21/2011   ALT 12 02/24/2012   AST 19 02/24/2012   NA 137 01/20/2013   K 4.0 01/20/2013   CL 102 01/20/2013   CREATININE 1.8* 01/20/2013   BUN 17 01/20/2013   CO2 28 01/20/2013   TSH 1.51 09/24/2011   PSA 3.77 06/04/2011   INR 2.2 05/14/2012   HGBA1C 5.5 09/24/2011   Echo Study Conclusions from August 2014  - Left ventricle: The cavity size was normal. Wall thickness was increased in a pattern of mild LVH. Systolic function was normal. The estimated ejection fraction was in the range of 55% to 60%. Wall motion was normal; there were no regional wall motion abnormalities. Features are consistent with a pseudonormal left ventricular filling pattern, with concomitant abnormal relaxation and increased filling pressure (grade 2 diastolic dysfunction). - Aortic valve: Mild regurgitation. - Mitral valve: Prior procedures included surgical repair. - Left atrium: The atrium was mildly to moderately dilated.    Assessment / Plan:  1. CAD - past CABG - some atypical chest pain - does not sound like angina - no exertional symptoms reported. Would monitor for now. Would try to manage conservatively. Wife is agreeable.   2. MV repair -  follow up echo last August showed resolution of his LV dysfunction.   3. Atrial fib - he remains in sinus on EKG today  4. Dementia - seems unchanged.   5.  Fatigue/somnolence - will see if cutting the Coreg back helps  6. HTN - BP running on the low side of normal - he was 98 systolic at Dr. Laverle Hobby. Will cut the Coreg back to 3.125 BID  See him back in a month. Check BMET today. Try to cut the potassium back if indicated.   Patient is agreeable to this plan and will call if any problems develop in the interim.   Rosalio Macadamia, RN, ANP-C [facility] [address] [place], [place]  [address] [phone]

## 2013-08-20 LAB — BASIC METABOLIC PANEL
BUN: 20 mg/dL (ref 6–23)
CO2: 27 mEq/L (ref 19–32)
Calcium: 9.1 mg/dL (ref 8.4–10.5)
Chloride: 107 mEq/L (ref 96–112)
Creatinine, Ser: 1.9 mg/dL — ABNORMAL HIGH (ref 0.4–1.5)
GFR: 36.43 mL/min — ABNORMAL LOW (ref >60.00)
Glucose, Bld: 178 mg/dL — ABNORMAL HIGH (ref 70–99)
Potassium: 4 mEq/L (ref 3.5–5.1)
Sodium: 142 mEq/L (ref 135–145)

## 2013-09-24 ENCOUNTER — Telehealth: Payer: Self-pay | Admitting: *Deleted

## 2013-09-24 NOTE — Telephone Encounter (Signed)
Patient's wife called from dental office. Patient is having a dental procedure today and she is inquiring about need for antibiotics He has hx of CABG, MV repair (with a ring per [name]) and MAZE procedure. Discussed with pharmacist, Wynona Caneshristine, she reviewed ADA guidelines which states prosthetic valves and valve materials indicate antibiotics. Informed [name] of this information and that the dentist will be able to provide the antibiotics. Verbalizes understanding.

## 2013-09-29 ENCOUNTER — Ambulatory Visit (INDEPENDENT_AMBULATORY_CARE_PROVIDER_SITE_OTHER): Payer: Medicare Other | Admitting: Nurse Practitioner

## 2013-09-29 ENCOUNTER — Encounter: Payer: Self-pay | Admitting: Nurse Practitioner

## 2013-09-29 VITALS — BP 120/80 | HR 66 | Ht 72.0 in | Wt 192.8 lb

## 2013-09-29 DIAGNOSIS — I10 Essential (primary) hypertension: Secondary | ICD-10-CM

## 2013-09-29 LAB — BASIC METABOLIC PANEL
BUN: 16 mg/dL (ref 6–23)
CO2: 29 mEq/L (ref 19–32)
Calcium: 9.6 mg/dL (ref 8.4–10.5)
Chloride: 104 mEq/L (ref 96–112)
Creatinine, Ser: 1.8 mg/dL — ABNORMAL HIGH (ref 0.4–1.5)
GFR: 40.53 mL/min — ABNORMAL LOW (ref >60.00)
Glucose, Bld: 100 mg/dL — ABNORMAL HIGH (ref 70–99)
Potassium: 4.3 mEq/L (ref 3.5–5.1)
Sodium: 140 mEq/L (ref 135–145)

## 2013-09-29 NOTE — Patient Instructions (Signed)
We will check lab today  Stay on your current medicines  See Dr. SwazilandJordan in August   Call the [facility] office at [phone] if you have any questions, problems or concerns.

## 2013-09-29 NOTE — Progress Notes (Signed)
Troy AlanisJack P Bayona Date of Birth: 06/16/1935 Medical Record #[id number]  History of Present Illness: [name] is seen back today for a one month visit. Seen for Dr. Swazil[place]Jordan. He is a 78 year old male with a history of HTN, atrial fib with prior MAZE (January 2013), BPH with prior TURP, chronic anticoagulation, CAD with past CABG which include LIMA to LAD, SVG to intermediate, and SVG to distal RCA with his MAZE and MV repair in January of 2013 per EBG, CKD, and past syncope related to heat exposure. Prior echo from January of 2013 - had mild to moderate LV dysfunction this improved on echo from August of 2014.   Last seen here last August of 2013. Cardiac status seemed stable. He had had an extensive neuro work up and was put on Aricept but not felt to have Alzheimer's.   I saw him last August of 2014 - Wife provided most of the history due to memory issues. Had no energy. Sleeping too much. We did update his echo - EF had improved.   Referred back here last month by Dr. Pete GlatterStoneking for Dr. Kevan NyGates for chronic chest pain - Mr. Whiteaker apparently denied chest pain but wife noted that he was having chest pain. Story quite difficult to obtain given the memory issues. Remained fatigue - but that has been chronic. Referred back here for further evaluation. BP was low and I cut his Coreg back hoping this would help. Also cut the potassium back. Trying to manage conservatively.   Comes back today. Here with his wife. She notes that he is doing better. Does not seem as fatigued. No chest pain. She is quite happy with how he is doing. BP ok.   Current Outpatient Prescriptions  Medication Sig Dispense Refill  . amLODipine (NORVASC) 5 MG tablet Take 5 mg by mouth daily.       Marland Kitchen. atorvastatin (LIPITOR) 40 MG tablet Take 20 mg by mouth daily after breakfast.      . carvedilol (COREG) 3.125 MG tablet Take 1 tablet (3.125 mg total) by mouth 2 (two) times daily with a meal.  60 tablet  3  . donepezil (ARICEPT) 10 MG  tablet Take 10 mg by mouth at bedtime.      . dutasteride (AVODART) 0.5 MG capsule Take 0.5 mg by mouth daily. EVERY AM      . finasteride (PROSCAR) 5 MG tablet       . fish oil-omega-3 fatty acids 1000 MG capsule Take 1 g by mouth daily.      Marland Kitchen. lisinopril (PRINIVIL,ZESTRIL) 20 MG tablet Take 20 mg by mouth daily.       . potassium chloride (K-DUR,KLOR-CON) 20 MEQ tablet Take 1 tablet (20 mEq total) by mouth 2 (two) times daily.  60 tablet  6  . vitamin B-12 (CYANOCOBALAMIN) 1000 MCG tablet Take 1,000 mcg by mouth daily.      Marland Kitchen. warfarin (COUMADIN) 3 MG tablet Take as directed by coumadin clinic  40 tablet  3   No current facility-administered medications for this visit.    No Known Allergies  Past Medical History  Diagnosis Date  . Hypertension   . Atrial fibrillation 06/2011    S/p left sided MAZE. On Coumadin, January, 2013  . Syncope     Near-syncope, etiology is deemed secondary to the excessive heat  . Benign prostatic hypertrophy     but stable  . Chronic anticoagulation   . Coronary artery disease 06/2011    S/p  LIMA-LAD, SVG-intermediate coronary (?), and SVG-distal RCA with concurrent MV repair and MAZE  . Mitral regurgitation 06/2011    Mitral valve ring annuloplasty at the time of CABG,, January, 2013  . Shortness of breath   . Chronic kidney disease 12/2010    with a creatinine on discharge of 1.84, was  2.1 on admission  . Calculus of kidney   . H/O hiatal hernia   . Ejection fraction < 50%     EF previously 50%, however 20-25% by echo and catheterization before CABG  . Chronic kidney disease   . Foley catheter in place     Past Surgical History  Procedure Laterality Date  . Circumcision  08/12/2002    because of phimosis  . Maze  06/06/2011    Procedure: MAZE;  Surgeon: Delight OvensEdward B Kiowa Hollar, MD;  [facility];  Service: Open Heart Surgery;  Laterality: N/A;  . Mitral valve repair  06/06/2011    Procedure: MITRAL VALVE REPAIR (MVR);  Surgeon: Delight OvensEdward B Wilhelmenia Addis, MD;   [facility];  Service: Open Heart Surgery;  Laterality: N/A;  . Coronary artery bypass graft  06/06/2011     grafts times three using left internal mammary artery and right leg greater saphenous vein harvested endoscopically  . Saturation biopsy of prostate    . Lithotripsy    . Transurethral resection of prostate  02/13/2012    Procedure: TRANSURETHRAL RESECTION OF THE PROSTATE WITH GYRUS INSTRUMENTS;  Surgeon: Milford Cageaniel Young Woodruff, MD;  Location: [facility] ORS;  Service: Urology;  Laterality: N/A;  . Cystoscopy  02/13/2012    Procedure: CYSTOSCOPY;  Surgeon: Milford Cageaniel Young Woodruff, MD;  Location: [facility] ORS;  Service: Urology;  Laterality: N/A;    History  Smoking status  . Former Smoker -- 45 years  . Types: Cigarettes  . Quit date: 07/01/1964  Smokeless tobacco  . Former NeurosurgeonUser  . Types: Chew    History  Alcohol Use No    Family History  Problem Relation Age of Onset  . Cancer Father   . Lung cancer Father   . Diabetes Father   . Hypertension Mother   . Hypertension Brother     Review of Systems: The review of systems is per the HPI.  All other systems were reviewed and are negative.  Physical Exam: BP 120/80  Pulse 66  Ht 6' (1.829 m)  Wt 192 lb 12.8 oz (87.454 kg)  BMI 26.14 kg/m2  SpO2 % Patient is very pleasant and in no acute distress. Skin is warm and dry. Color is normal.  HEENT is unremarkable. Normocephalic/atraumatic. PERRL. Sclera are nonicteric. Neck is supple. No masses. No JVD. Lungs are clear. Cardiac exam shows a regular rate and rhythm. Abdomen is soft. Extremities are without edema. Gait and ROM are intact. No gross neurologic deficits noted.   LABORATORY DATA:  Lab Results  Component Value Date   WBC 7.7 02/25/2012   HGB 12.7* 02/25/2012   HCT 37.4* 02/25/2012   PLT 207 02/25/2012   GLUCOSE 178* 08/19/2013   CHOL 142 09/21/2011   TRIG 199.0* 09/21/2011   HDL 32.50* 09/21/2011   LDLCALC 70 09/21/2011   ALT 12 02/24/2012   AST 19 02/24/2012   NA 142  08/19/2013   K 4.0 08/19/2013   CL 107 08/19/2013   CREATININE 1.9* 08/19/2013   BUN 20 08/19/2013   CO2 27 08/19/2013   TSH 1.51 09/24/2011   PSA 3.77 06/04/2011   INR 2.2 05/14/2012   HGBA1C 5.5 09/24/2011  Laboratory Data:  Echo Study Conclusions from August 2014  - Left ventricle: The cavity size was normal. Wall thickness was increased in a pattern of mild LVH. Systolic function was normal. The estimated ejection fraction was in the range of 55% to 60%. Wall motion was normal; there were no regional wall motion abnormalities. Features are consistent with a pseudonormal left ventricular filling pattern, with concomitant abnormal relaxation and increased filling pressure (grade 2 diastolic dysfunction). - Aortic valve: Mild regurgitation. - Mitral valve: Prior procedures included surgical repair. - Left atrium: The atrium was mildly to moderately dilated.    Assessment / Plan:  1. CAD - past CABG - doing well with recent cut back of his Coreg. No more chest pain. Needs repeat BMET today. See back in August as planned.   2. MV repair - follow up echo last August showed resolution of his LV dysfunction.   3. Atrial fib - was in sinus on last EKG and on exam today  4. Dementia - seems unchanged. Etiology unclear.   5. Fatigue/somnolence - may be improved with reduction of his COreg.   6. HTN - BP stable - wife will continue to monitor at home.   See back in August as planned. Recheck BMET today.   Patient is agreeable to this plan and will call if any problems develop in the interim.   Rosalio Macadamia, RN, ANP-C  [facility]  [address]  [place], [place] [address]  [phone]

## 2013-10-13 ENCOUNTER — Other Ambulatory Visit: Payer: Self-pay | Admitting: Cardiology

## 2013-10-30 ENCOUNTER — Other Ambulatory Visit: Payer: Self-pay | Admitting: Physician Assistant

## 2014-01-20 ENCOUNTER — Other Ambulatory Visit: Payer: Self-pay | Admitting: *Deleted

## 2014-01-20 MED ORDER — CARVEDILOL 3.125 MG PO TABS: 3.1250 mg | ORAL_TABLET | Freq: Two times a day (BID) | ORAL | Status: DC

## 2014-03-03 ENCOUNTER — Other Ambulatory Visit: Payer: Self-pay

## 2014-03-03 MED ORDER — CARVEDILOL 3.125 MG PO TABS: 3.1250 mg | ORAL_TABLET | Freq: Two times a day (BID) | ORAL | Status: DC

## 2014-06-07 ENCOUNTER — Other Ambulatory Visit: Payer: Self-pay

## 2014-06-07 ENCOUNTER — Ambulatory Visit (INDEPENDENT_AMBULATORY_CARE_PROVIDER_SITE_OTHER): Payer: Medicare Other | Admitting: Cardiology

## 2014-06-07 ENCOUNTER — Encounter: Payer: Self-pay | Admitting: Cardiology

## 2014-06-07 VITALS — BP 128/82 | HR 65 | Ht 73.0 in | Wt 198.9 lb

## 2014-06-07 DIAGNOSIS — I10 Essential (primary) hypertension: Secondary | ICD-10-CM

## 2014-06-07 DIAGNOSIS — I480 Paroxysmal atrial fibrillation: Secondary | ICD-10-CM

## 2014-06-07 DIAGNOSIS — I25810 Atherosclerosis of coronary artery bypass graft(s) without angina pectoris: Secondary | ICD-10-CM

## 2014-06-07 DIAGNOSIS — Z7901 Long term (current) use of anticoagulants: Secondary | ICD-10-CM

## 2014-06-07 LAB — PROTIME-INR: INR: 2.9 — AB (ref 0.9–1.1)

## 2014-06-07 MED ORDER — ATORVASTATIN CALCIUM 40 MG PO TABS: ORAL_TABLET | ORAL | Status: DC

## 2014-06-07 NOTE — Progress Notes (Signed)
Troy Gregory Date of Birth: 09-Feb-1936 Medical Record #[id number]  History of Present Illness: Troy Gregory is seen for follow up of CAD. He is a 79 year old male with a history of HTN, atrial fib with prior MAZE (January 2013), BPH with prior TURP, chronic anticoagulation, CAD with  CABG  which include LIMA to LAD, SVG to intermediate, and SVG to distal RCA with his MAZE and MV repair in January of 2013, CKD, and past syncope related to heat exposure. Prior echo from January of 2013 - had mild to moderate LV dysfunction this improved on echo from August of 2014.   He is doing very well from a cardiac standpoint. Infrequent chest pain more consistent with indigestion. No palpitations, dyspnea, or dizziness. Still dealing with memory loss and is on Aricept. Stays active.  Current Outpatient Prescriptions  Medication Sig Dispense Refill  . amLODipine (NORVASC) 5 MG tablet Take 5 mg by mouth daily.     Marland Kitchen atorvastatin (LIPITOR) 40 MG tablet TAKE 1 TABLET BY MOUTH DAILY AFTER BREAKFAST 30 tablet 6  . carvedilol (COREG) 3.125 MG tablet Take 1 tablet (3.125 mg total) by mouth 2 (two) times daily with a meal. 60 tablet 3  . donepezil (ARICEPT) 10 MG tablet Take 10 mg by mouth at bedtime.    . finasteride (PROSCAR) 5 MG tablet Take 5 mg by mouth daily.     . fish oil-omega-3 fatty acids 1000 MG capsule Take 1 g by mouth daily.    Marland Kitchen lisinopril (PRINIVIL,ZESTRIL) 20 MG tablet Take 20 mg by mouth daily.     . Multiple Vitamins-Minerals (PRESERVISION/LUTEIN) CAPS Take by mouth 2 (two) times daily.    . potassium chloride SA (K-DUR,KLOR-CON) 20 MEQ tablet Take 10 mEq by mouth 2 (two) times daily.    . vitamin B-12 (CYANOCOBALAMIN) 1000 MCG tablet Take 1,000 mcg by mouth daily.    Marland Kitchen warfarin (COUMADIN) 3 MG tablet Take as directed by coumadin clinic 40 tablet 3   No current facility-administered medications for this visit.    No Known Allergies  Past Medical History  Diagnosis Date  . Hypertension     . Atrial fibrillation 06/2011    S/p left sided MAZE. On Coumadin, January, 2013  . Syncope     Near-syncope, etiology is deemed secondary to the excessive heat  . Benign prostatic hypertrophy     but stable  . Chronic anticoagulation   . Coronary artery disease 06/2011    S/p LIMA-LAD, SVG-intermediate coronary (?), and SVG-distal RCA with concurrent MV repair and MAZE  . Mitral regurgitation 06/2011    Mitral valve ring annuloplasty at the time of CABG,, January, 2013  . Shortness of breath   . Chronic kidney disease 12/2010    with a creatinine on discharge of 1.84, was  2.1 on admission  . Calculus of kidney   . H/O hiatal hernia   . Ejection fraction < 50%     EF previously 50%, however 20-25% by echo and catheterization before CABG  . Chronic kidney disease   . Foley catheter in place     Past Surgical History  Procedure Laterality Date  . Circumcision  08/12/2002    because of phimosis  . Maze  06/06/2011    Procedure: MAZE;  Surgeon: [name], MD;  Location: [facility] OR;  Service: Open Heart Surgery;  Laterality: N/A;  . Mitral valve repair  06/06/2011    Procedure: MITRAL VALVE REPAIR (MVR);  Surgeon: Gwenith Daily  Tyrone Sage, MD;  Location: [facility] OR;  Service: Open Heart Surgery;  Laterality: N/A;  . Coronary artery bypass graft  06/06/2011     grafts times three using left internal mammary artery and right leg greater saphenous vein harvested endoscopically  . Saturation biopsy of prostate    . Lithotripsy    . Transurethral resection of prostate  02/13/2012    Procedure: TRANSURETHRAL RESECTION OF THE PROSTATE WITH GYRUS INSTRUMENTS;  Surgeon: Milford Cage, MD;  Location: [facility] ORS;  Service: Urology;  Laterality: N/A;  . Cystoscopy  02/13/2012    Procedure: CYSTOSCOPY;  Surgeon: Milford Cage, MD;  Location: [facility] ORS;  Service: Urology;  Laterality: N/A;    History  Smoking status  . Former Smoker -- 45 years  . Types: Cigarettes  . Quit date: 07/01/1964   Smokeless tobacco  . Former Neurosurgeon  . Types: Chew    History  Alcohol Use No    Family History  Problem Relation Age of Onset  . Cancer Father   . Lung cancer Father   . Diabetes Father   . Hypertension Mother   . Hypertension Brother     Review of Systems: The review of systems is per the HPI.  All other systems were reviewed and are negative.  Physical Exam: BP 128/82 mmHg  Pulse 65  Ht  (1.854 m)  Wt 198 lb 14.4 oz (90.22 kg)  BMI 26.25 kg/m2 Patient is very pleasant and in no acute distress. Skin is warm and dry. Color is normal.  HEENT is unremarkable. Normocephalic/atraumatic. PERRL. Sclera are nonicteric. Neck is supple. No masses. No JVD. Lungs are clear. Cardiac exam shows a regular rate and rhythm. Normal S1-2. No gallop or murmur. Abdomen is soft. Extremities are without edema. Gait and ROM are intact. No gross neurologic deficits noted.   LABORATORY DATA:  Lab Results  Component Value Date   WBC 7.7 02/25/2012   HGB 12.7* 02/25/2012   HCT 37.4* 02/25/2012   PLT 207 02/25/2012   GLUCOSE 100* 09/29/2013   CHOL 142 09/21/2011   TRIG 199.0* 09/21/2011   HDL 32.50* 09/21/2011   LDLCALC 70 09/21/2011   ALT 12 02/24/2012   AST 19 02/24/2012   NA 140 09/29/2013   K 4.3 09/29/2013   CL 104 09/29/2013   CREATININE 1.8* 09/29/2013   BUN 16 09/29/2013   CO2 29 09/29/2013   TSH 1.51 09/24/2011   PSA 3.77 06/04/2011   INR 2.2 05/14/2012   HGBA1C 5.5 09/24/2011    Laboratory Data:  Echo Study Conclusions from August 2014  - Left ventricle: The cavity size was normal. Wall thickness was increased in a pattern of mild LVH. Systolic function was normal. The estimated ejection fraction was in the range of 55% to 60%. Wall motion was normal; there were no regional wall motion abnormalities. Features are consistent with a pseudonormal left ventricular filling pattern, with concomitant abnormal relaxation and increased filling pressure (grade 2  diastolic dysfunction). - Aortic valve: Mild regurgitation. - Mitral valve: Prior procedures included surgical repair. - Left atrium: The atrium was mildly to moderately dilated.  Ecg today: NSR, LAD, poor R wave progression in the precordial leads. I have personally reviewed and interpreted this study.   Assessment / Plan:  1. CAD - past CABG - doing well without angina. Follow up in one year. Continue current medications.    2. MV repair - follow up echo last August showed resolution of his LV dysfunction.   3.  Atrial fib - was in sinus on last EKG and on exam today. On long term coumadin.   4. Dementia - seems unchanged. On Aricept.   5. HTN - BP stable - wife will continue to monitor at home.

## 2014-06-07 NOTE — Patient Instructions (Signed)
Continue your current therapy  I will see you in one year   

## 2014-07-12 ENCOUNTER — Other Ambulatory Visit: Payer: Self-pay

## 2014-07-12 MED ORDER — POTASSIUM CHLORIDE CRYS ER 20 MEQ PO TBCR: 10.0000 meq | EXTENDED_RELEASE_TABLET | Freq: Two times a day (BID) | ORAL | Status: DC

## 2014-08-11 ENCOUNTER — Encounter (HOSPITAL_COMMUNITY): Payer: Self-pay | Admitting: General Practice

## 2014-08-11 ENCOUNTER — Emergency Department (HOSPITAL_COMMUNITY): Payer: Medicare Other

## 2014-08-11 ENCOUNTER — Inpatient Hospital Stay (HOSPITAL_COMMUNITY)
Admission: EM | Admit: 2014-08-11 | Discharge: 2014-08-12 | DRG: 312 | Disposition: A | Discharge status: Home / Self Care | Payer: Medicare Other | Attending: Internal Medicine | Admitting: Internal Medicine

## 2014-08-11 DIAGNOSIS — R413 Other amnesia: Secondary | ICD-10-CM | POA: Diagnosis present

## 2014-08-11 DIAGNOSIS — Z7901 Long term (current) use of anticoagulants: Secondary | ICD-10-CM | POA: Diagnosis not present

## 2014-08-11 DIAGNOSIS — Z79899 Other long term (current) drug therapy: Secondary | ICD-10-CM

## 2014-08-11 DIAGNOSIS — N390 Urinary tract infection, site not specified: Secondary | ICD-10-CM | POA: Diagnosis present

## 2014-08-11 DIAGNOSIS — I5042 Chronic combined systolic (congestive) and diastolic (congestive) heart failure: Secondary | ICD-10-CM | POA: Diagnosis present

## 2014-08-11 DIAGNOSIS — I340 Nonrheumatic mitral (valve) insufficiency: Secondary | ICD-10-CM | POA: Diagnosis present

## 2014-08-11 DIAGNOSIS — N183 Chronic kidney disease, stage 3 unspecified: Secondary | ICD-10-CM | POA: Diagnosis present

## 2014-08-11 DIAGNOSIS — I493 Ventricular premature depolarization: Secondary | ICD-10-CM | POA: Diagnosis present

## 2014-08-11 DIAGNOSIS — Z87891 Personal history of nicotine dependence: Secondary | ICD-10-CM

## 2014-08-11 DIAGNOSIS — I482 Chronic atrial fibrillation, unspecified: Secondary | ICD-10-CM | POA: Insufficient documentation

## 2014-08-11 DIAGNOSIS — I2510 Atherosclerotic heart disease of native coronary artery without angina pectoris: Secondary | ICD-10-CM | POA: Diagnosis present

## 2014-08-11 DIAGNOSIS — I959 Hypotension, unspecified: Secondary | ICD-10-CM | POA: Diagnosis present

## 2014-08-11 DIAGNOSIS — I129 Hypertensive chronic kidney disease with stage 1 through stage 4 chronic kidney disease, or unspecified chronic kidney disease: Secondary | ICD-10-CM | POA: Diagnosis present

## 2014-08-11 DIAGNOSIS — E785 Hyperlipidemia, unspecified: Secondary | ICD-10-CM | POA: Diagnosis present

## 2014-08-11 DIAGNOSIS — R55 Syncope and collapse: Secondary | ICD-10-CM | POA: Diagnosis not present

## 2014-08-11 DIAGNOSIS — N400 Enlarged prostate without lower urinary tract symptoms: Secondary | ICD-10-CM | POA: Diagnosis present

## 2014-08-11 DIAGNOSIS — D72829 Elevated white blood cell count, unspecified: Secondary | ICD-10-CM | POA: Diagnosis present

## 2014-08-11 DIAGNOSIS — I480 Paroxysmal atrial fibrillation: Secondary | ICD-10-CM | POA: Diagnosis present

## 2014-08-11 DIAGNOSIS — Z951 Presence of aortocoronary bypass graft: Secondary | ICD-10-CM

## 2014-08-11 DIAGNOSIS — Z9889 Other specified postprocedural states: Secondary | ICD-10-CM

## 2014-08-11 DIAGNOSIS — I10 Essential (primary) hypertension: Secondary | ICD-10-CM | POA: Diagnosis present

## 2014-08-11 HISTORY — DX: Pure hypercholesterolemia, unspecified: E78.00

## 2014-08-11 HISTORY — DX: Unspecified osteoarthritis, unspecified site: M19.90

## 2014-08-11 HISTORY — DX: Chronic kidney disease, stage 3 unspecified: N18.30

## 2014-08-11 HISTORY — DX: Chronic kidney disease, stage 3 (moderate): N18.3

## 2014-08-11 HISTORY — DX: Acute myocardial infarction, unspecified: I21.9

## 2014-08-11 LAB — COMPREHENSIVE METABOLIC PANEL
ALK PHOS: 66 U/L (ref 39–117)
ALT: 18 U/L (ref 0–53)
AST: 25 U/L (ref 0–37)
Albumin: 3.5 g/dL (ref 3.5–5.2)
Anion gap: 7 (ref 5–15)
BUN: 23 mg/dL (ref 6–23)
CHLORIDE: 106 mmol/L (ref 96–112)
CO2: 27 mmol/L (ref 19–32)
Calcium: 9.3 mg/dL (ref 8.4–10.5)
Creatinine, Ser: 2.01 mg/dL — ABNORMAL HIGH (ref 0.50–1.35)
GFR calc Af Amer: 35 mL/min — ABNORMAL LOW (ref >90)
GFR calc non Af Amer: 31 mL/min — ABNORMAL LOW (ref >90)
GLUCOSE: 171 mg/dL — AB (ref 70–99)
Potassium: 4 mmol/L (ref 3.5–5.1)
SODIUM: 140 mmol/L (ref 135–145)
Total Bilirubin: 0.5 mg/dL (ref 0.3–1.2)
Total Protein: 7.2 g/dL (ref 6.0–8.3)

## 2014-08-11 LAB — CBC WITH DIFFERENTIAL/PLATELET
BASOS PCT: 1 % (ref 0–1)
Basophils Absolute: 0.1 10*3/uL (ref 0.0–0.1)
EOS ABS: 0.2 10*3/uL (ref 0.0–0.7)
Eosinophils Relative: 2 % (ref 0–5)
HCT: 44.2 % (ref 39.0–52.0)
Hemoglobin: 15.3 g/dL (ref 13.0–17.0)
LYMPHS PCT: 9 % — AB (ref 12–46)
Lymphs Abs: 1 10*3/uL (ref 0.7–4.0)
MCH: 31.7 pg (ref 26.0–34.0)
MCHC: 34.6 g/dL (ref 30.0–36.0)
MCV: 91.7 fL (ref 78.0–100.0)
MONO ABS: 0.8 10*3/uL (ref 0.1–1.0)
MONOS PCT: 7 % (ref 3–12)
NEUTROS PCT: 82 % — AB (ref 43–77)
Neutro Abs: 8.8 10*3/uL — ABNORMAL HIGH (ref 1.7–7.7)
Platelets: 184 10*3/uL (ref 150–400)
RBC: 4.82 MIL/uL (ref 4.22–5.81)
RDW: 13.1 % (ref 11.5–15.5)
WBC: 10.8 10*3/uL — AB (ref 4.0–10.5)

## 2014-08-11 LAB — URINALYSIS, ROUTINE W REFLEX MICROSCOPIC
Bilirubin Urine: NEGATIVE
Glucose, UA: NEGATIVE mg/dL
Ketones, ur: 15 mg/dL — AB
Nitrite: NEGATIVE
PROTEIN: 100 mg/dL — AB
Specific Gravity, Urine: 1.021 (ref 1.005–1.030)
Urobilinogen, UA: 1 mg/dL (ref 0.0–1.0)
pH: 6.5 (ref 5.0–8.0)

## 2014-08-11 LAB — I-STAT CHEM 8, ED
BUN: 24 mg/dL — AB (ref 6–23)
CHLORIDE: 106 mmol/L (ref 96–112)
CREATININE: 1.8 mg/dL — AB (ref 0.50–1.35)
Calcium, Ion: 1.19 mmol/L (ref 1.13–1.30)
GLUCOSE: 170 mg/dL — AB (ref 70–99)
HCT: 46 % (ref 39.0–52.0)
Hemoglobin: 15.6 g/dL (ref 13.0–17.0)
Potassium: 4.1 mmol/L (ref 3.5–5.1)
SODIUM: 142 mmol/L (ref 135–145)
TCO2: 23 mmol/L (ref 0–100)

## 2014-08-11 LAB — URINE MICROSCOPIC-ADD ON

## 2014-08-11 LAB — CBG MONITORING, ED: GLUCOSE-CAPILLARY: 139 mg/dL — AB (ref 70–99)

## 2014-08-11 LAB — I-STAT TROPONIN, ED: TROPONIN I, POC: 0 ng/mL (ref 0.00–0.08)

## 2014-08-11 LAB — TROPONIN I
Troponin I: <0.03 ng/mL (ref <0.031)
Troponin I: <0.03 ng/mL (ref <0.031)

## 2014-08-11 LAB — PROTIME-INR
INR: 2.81 — ABNORMAL HIGH (ref 0.00–1.49)
Prothrombin Time: 29.8 seconds — ABNORMAL HIGH (ref 11.6–15.2)

## 2014-08-11 LAB — TSH: TSH: 1.335 u[IU]/mL (ref 0.350–4.500)

## 2014-08-11 LAB — POC OCCULT BLOOD, ED: FECAL OCCULT BLD: NEGATIVE

## 2014-08-11 MED ORDER — DEXTROSE 5 % IV SOLN
1.0000 g | Freq: Once | INTRAVENOUS | Status: AC
  Administered 2014-08-11: 1 g via INTRAVENOUS
  Filled 2014-08-11: qty 10

## 2014-08-11 MED ORDER — DONEPEZIL HCL 10 MG PO TABS
10.0000 mg | ORAL_TABLET | Freq: Every day | ORAL | Status: DC
  Administered 2014-08-11: 10 mg via ORAL
  Filled 2014-08-11: qty 1

## 2014-08-11 MED ORDER — ACETAMINOPHEN 650 MG RE SUPP: 650.0000 mg | Freq: Four times a day (QID) | RECTAL | Status: DC | PRN

## 2014-08-11 MED ORDER — SODIUM CHLORIDE 0.9 % IV SOLN
INTRAVENOUS | Status: DC
  Administered 2014-08-11 – 2014-08-12 (×2): via INTRAVENOUS

## 2014-08-11 MED ORDER — ONDANSETRON HCL 4 MG PO TABS: 4.0000 mg | ORAL_TABLET | Freq: Four times a day (QID) | ORAL | Status: DC | PRN

## 2014-08-11 MED ORDER — POTASSIUM CHLORIDE CRYS ER 10 MEQ PO TBCR
10.0000 meq | EXTENDED_RELEASE_TABLET | Freq: Two times a day (BID) | ORAL | Status: DC
  Administered 2014-08-11 – 2014-08-12 (×2): 10 meq via ORAL
  Filled 2014-08-11 (×2): qty 1

## 2014-08-11 MED ORDER — ATORVASTATIN CALCIUM 40 MG PO TABS
40.0000 mg | ORAL_TABLET | Freq: Every day | ORAL | Status: DC
  Administered 2014-08-11: 40 mg via ORAL
  Filled 2014-08-11: qty 1

## 2014-08-11 MED ORDER — WARFARIN - PHARMACIST DOSING INPATIENT: Freq: Every day | Status: DC

## 2014-08-11 MED ORDER — FINASTERIDE 5 MG PO TABS
5.0000 mg | ORAL_TABLET | Freq: Every day | ORAL | Status: DC
  Administered 2014-08-12: 5 mg via ORAL
  Filled 2014-08-11: qty 1

## 2014-08-11 MED ORDER — OMEGA-3-ACID ETHYL ESTERS 1 G PO CAPS
1.0000 g | ORAL_CAPSULE | Freq: Every day | ORAL | Status: DC
  Administered 2014-08-11 – 2014-08-12 (×2): 1 g via ORAL
  Filled 2014-08-11 (×2): qty 1

## 2014-08-11 MED ORDER — ONDANSETRON HCL 4 MG/2ML IJ SOLN: 4.0000 mg | Freq: Four times a day (QID) | INTRAMUSCULAR | Status: DC | PRN

## 2014-08-11 MED ORDER — OMEGA-3 FATTY ACIDS 1000 MG PO CAPS: 1.0000 g | ORAL_CAPSULE | Freq: Every day | ORAL | Status: DC

## 2014-08-11 MED ORDER — ACETAMINOPHEN 325 MG PO TABS
650.0000 mg | ORAL_TABLET | Freq: Four times a day (QID) | ORAL | Status: DC | PRN
  Administered 2014-08-11: 650 mg via ORAL
  Filled 2014-08-11: qty 2

## 2014-08-11 MED ORDER — DEXTROSE 5 % IV SOLN
1.0000 g | INTRAVENOUS | Status: DC
  Filled 2014-08-11: qty 10

## 2014-08-11 MED ORDER — SODIUM CHLORIDE 0.9 % IJ SOLN: 3.0000 mL | Freq: Two times a day (BID) | INTRAMUSCULAR | Status: DC

## 2014-08-11 NOTE — ED Provider Notes (Signed)
CSN: [id number]     Arrival date & time 08/11/14  1212 History   First MD Initiated Contact with Patient 08/11/14 1222     Chief Complaint  Patient presents with  . Hypotension     (Consider location/radiation/quality/duration/timing/severity/associated sxs/prior Treatment) HPI  CATHY ROPP Is a 79 year old male with a past medical history of A. fib, coronary artery disease, mitral regurg, chronic kidney disease, previous syncopal events due to heat. Patient referred by [facility] physicians after having approximately 3 syncopal episodes associated with hypertension today. Patient does not have a history of vasovagal response to procedures. Patient was at his physician's having a and infected sebaceous cyst lanced. The patient denies melena or hematochezia. The patient has known short-term memory loss. She gives most of the history. She states that his INR was 1.84. The other day. And slightly under therapeutic value. The patient does not remember the events that occurred today. He has not had any symptoms of systemic infection.  Past Medical History  Diagnosis Date  . Hypertension   . Atrial fibrillation 06/2011    S/p left sided MAZE. On Coumadin, January, 2013  . Syncope     Near-syncope, etiology is deemed secondary to the excessive heat  . Benign prostatic hypertrophy     but stable  . Chronic anticoagulation   . Coronary artery disease 06/2011    S/p LIMA-LAD, SVG-intermediate coronary (?), and SVG-distal RCA with concurrent MV repair and MAZE  . Mitral regurgitation 06/2011    Mitral valve ring annuloplasty at the time of CABG,, January, 2013  . Shortness of breath   . Chronic kidney disease 12/2010    with a creatinine on discharge of 1.84, was  2.1 on admission  . Calculus of kidney   . H/O hiatal hernia   . Ejection fraction < 50%     EF previously 50%, however 20-25% by echo and catheterization before CABG  . Chronic kidney disease   . Foley catheter in place    Past Surgical  History  Procedure Laterality Date  . Circumcision  08/12/2002    because of phimosis  . Maze  06/06/2011    Procedure: MAZE;  Surgeon: [name], MD;  Location: [facility] OR;  Service: Open Heart Surgery;  Laterality: N/A;  . Mitral valve repair  06/06/2011    Procedure: MITRAL VALVE REPAIR (MVR);  Surgeon: [name], MD;  Location: [facility] OR;  Service: Open Heart Surgery;  Laterality: N/A;  . Coronary artery bypass graft  06/06/2011     grafts times three using left internal mammary artery and right leg greater saphenous vein harvested endoscopically  . Saturation biopsy of prostate    . Lithotripsy    . Transurethral resection of prostate  02/13/2012    Procedure: TRANSURETHRAL RESECTION OF THE PROSTATE WITH GYRUS INSTRUMENTS;  Surgeon: Milford Cage, MD;  Location: [facility] ORS;  Service: Urology;  Laterality: N/A;  . Cystoscopy  02/13/2012    Procedure: CYSTOSCOPY;  Surgeon: Milford Cage, MD;  Location: [facility] ORS;  Service: Urology;  Laterality: N/A;   Family History  Problem Relation Age of Onset  . Cancer Father   . Lung cancer Father   . Diabetes Father   . Hypertension Mother   . Hypertension Brother    History  Substance Use Topics  . Smoking status: Former Smoker -- 45 years    Types: Cigarettes    Quit date: 07/01/1964  . Smokeless tobacco: Former Neurosurgeon    Types: Chew  .  Alcohol Use: No    Review of Systems  Unable to perform ROS: Other      Allergies  Review of patient's allergies indicates no known allergies.  Home Medications   Prior to Admission medications   Medication Sig Start Date End Date Taking? Authorizing Provider  amLODipine (NORVASC) 5 MG tablet Take 5 mg by mouth daily.  08/13/13  Yes Historical Provider, MD  atorvastatin (LIPITOR) 40 MG tablet TAKE 1 TABLET BY MOUTH DAILY AFTER BREAKFAST Patient taking differently: Take 20 mg by mouth daily at 6 PM.  06/07/14  Yes Peter M Swazil[place], MD  carvedilol (COREG) 12.5 MG tablet Take 12.5 mg by  mouth 2 (two) times daily with a meal.   Yes Historical Provider, MD  donepezil (ARICEPT) 10 MG tablet Take 10 mg by mouth at bedtime.   Yes Historical Provider, MD  finasteride (PROSCAR) 5 MG tablet Take 5 mg by mouth daily.  08/07/13  Yes Historical Provider, MD  lisinopril (PRINIVIL,ZESTRIL) 20 MG tablet Take 20 mg by mouth daily.  02/25/12  Yes Marden Noble, MD  potassium chloride (MICRO-K) 10 MEQ CR capsule Take 10 mEq by mouth 2 (two) times daily.   Yes Historical Provider, MD  vitamin B-12 (CYANOCOBALAMIN) 1000 MCG tablet Take 1,000 mcg by mouth daily.   Yes Historical Provider, MD  warfarin (COUMADIN) 3 MG tablet Take as directed by coumadin clinic Patient taking differently: Take 3-6 mg by mouth daily.  daily except for  on Sundays 04/21/12  Yes Peter M [place], MD   BP 117/76 mmHg  Pulse 58  Temp(Src) 97.8 F (36.6 C) (Rectal)  Resp 18  Ht 6' (1.829 m)  Wt 205 lb (92.987 kg)  BMI 27.80 kg/m2  SpO2 95% Physical Exam  Constitutional: He appears well-developed and well-nourished. No distress.  HENT:  Head: Normocephalic and atraumatic.  Eyes: Conjunctivae and EOM are normal. Pupils are equal, round, and reactive to light. No scleral icterus.  Neck: Normal range of motion. Neck supple. No JVD present.  Recent incision and drainage of left neck sebaceous cyst. It is red and erythematous with surrounding induration. There is packing noted at the surgical incision.  Cardiovascular: Normal rate and regular rhythm.  Exam reveals no gallop and no friction rub.   No murmur heard. Pulmonary/Chest: Effort normal and breath sounds normal. No respiratory distress.  Abdominal: Soft. He exhibits no distension and no mass. There is no tenderness. There is no guarding.  Musculoskeletal: Normal range of motion. He exhibits no edema.  Neurological: He is alert.  Skin: Skin is warm and dry. He is not diaphoretic.  Psychiatric: His behavior is normal.  Nursing note and vitals reviewed.   ED  Course  Procedures (including critical care time) Labs Review Labs Reviewed  CBC WITH DIFFERENTIAL/PLATELET - Abnormal; Notable for the following:    WBC 10.8 (*)    Neutrophils Relative % 82 (*)    Neutro Abs 8.8 (*)    Lymphocytes Relative 9 (*)    All other components within normal limits  COMPREHENSIVE METABOLIC PANEL - Abnormal; Notable for the following:    Glucose, Bld 171 (*)    Creatinine, Ser 2.01 (*)    GFR calc non Af Amer 31 (*)    GFR calc Af Amer 35 (*)    All other components within normal limits  URINALYSIS, ROUTINE W REFLEX MICROSCOPIC - Abnormal; Notable for the following:    Color, Urine AMBER (*)    Hgb urine dipstick SMALL (*)  Ketones, ur 15 (*)    Protein, ur 100 (*)    Leukocytes, UA MODERATE (*)    All other components within normal limits  PROTIME-INR - Abnormal; Notable for the following:    Prothrombin Time 29.8 (*)    INR 2.81 (*)    All other components within normal limits  URINE MICROSCOPIC-ADD ON - Abnormal; Notable for the following:    Bacteria, UA FEW (*)    All other components within normal limits  I-STAT CHEM 8, ED - Abnormal; Notable for the following:    BUN 24 (*)    Creatinine, Ser 1.80 (*)    Glucose, Bld 170 (*)    All other components within normal limits  CBG MONITORING, ED - Abnormal; Notable for the following:    Glucose-Capillary 139 (*)    All other components within normal limits  OCCULT BLOOD X 1 CARD TO LAB, STOOL  POCT CBG (FASTING - GLUCOSE)-MANUAL ENTRY  I-STAT TROPOININ, ED  POC OCCULT BLOOD, ED    Imaging Review Dg Chest 2 View  08/11/2014   CLINICAL DATA:  Chest pain and shortness of breath today, initial encounter.  EXAM: CHEST  2 VIEW  COMPARISON:  02/24/2012.  FINDINGS: Trachea is midline. Heart size normal. Minimal streaky atelectasis or scarring at the lung bases. No airspace consolidation or pleural fluid.  IMPRESSION: No acute findings.   Electronically Signed   By: Leanna BattlesMelinda  Blietz M.D.   On: 08/11/2014  13:33     EKG Interpretation   Date/Time:  Wednesday August 11 2014 12:24:14 EST Ventricular Rate:  57 PR Interval:  169 QRS Duration: 84 QT Interval:  411 QTC Calculation: 400 R Axis:   -45 Text Interpretation:  Sinus rhythm Multiform ventricular premature  complexes Inferior infarct, old Consider anterior infarct Confirmed by  ZACKOWSKI  MD, SCOTT [phone] on 08/11/2014 12:28:54 PM      MDM   Final diagnoses:  Syncope and collapse  UTI (lower urinary tract infection)  PVC's (premature ventricular contractions)    Vision here with 3 episodes of syncope associated with hypotension today at his PCPs office. Initial blood pressure upon arrival was 96/48. His blood pressure has stabilized. Patient seen in shared visit with Dr. Deretha EmoryZackowski. EKG shows multiform PVCs. Urine appears infected with 21-50 white blood cells and a few bacteria. Because his slightly elevated. Chest x-ray is without abnormality.   Patient with urinary tract infection. He has received Rocephin IV. Patient will be admitted by Dr. Elisabeth Pigeonevine. She is asked for cardiology consult. He is hyperglycemic and his labs appear otherwise at baseline. The elevated white count at 10.8, normal chest x-ray.  Arthor Captainbigail Korbyn Chopin, PA-C 08/11/14 [phone]

## 2014-08-11 NOTE — Progress Notes (Signed)
ANTICOAGULATION CONSULT NOTE - Initial Consult  Pharmacy Consult for warfarin Indication: atrial fibrillation  No Known Allergies  Patient Measurements: Height: 6' (182.9 cm) Weight: 205 lb (92.987 kg) IBW/kg (Calculated) : 77.6  Vital Signs: Temp: 97.8 F (36.6 C) (03/09 1408) Temp Source: Rectal (03/09 1408) BP: 117/76 mmHg (03/09 1545) Pulse Rate: 58 (03/09 1545)  Labs:  Recent Labs  08/11/14 1354 08/11/14 1410  HGB 15.3 15.6  HCT 44.2 46.0  PLT 184  --   LABPROT 29.8*  --   INR 2.81*  --   CREATININE 2.01* 1.80*    Estimated Creatinine Clearance: 38.3 mL/min (by C-G formula based on Cr of 1.8).   Medical History: Past Medical History  Diagnosis Date  . Hypertension   . Atrial fibrillation 06/2011    S/p left sided MAZE. On Coumadin, January, 2013  . Syncope     Near-syncope, etiology is deemed secondary to the excessive heat  . Benign prostatic hypertrophy     but stable  . Chronic anticoagulation   . Coronary artery disease 06/2011    S/p LIMA-LAD, SVG-intermediate coronary (?), and SVG-distal RCA with concurrent MV repair and MAZE  . Mitral regurgitation 06/2011    Mitral valve ring annuloplasty at the time of CABG,, January, 2013  . Shortness of breath   . Chronic kidney disease 12/2010    with a creatinine on discharge of 1.84, was  2.1 on admission  . Calculus of kidney   . H/O hiatal hernia   . Ejection fraction < 50%     EF previously 50%, however 20-25% by echo and catheterization before CABG  . Chronic kidney disease   . Foley catheter in place    Assessment: [age] yom presented to the ED with hypotension and syncopal event at MD's office where he was getting a cyst removed. He is on chronic coumadin for history of afib. INR is therapeutic at 2.81. FOB negative and no other bleeding noted. Pt already took his dose today.   Goal of Therapy:  INR 2-3 Monitor platelets by anticoagulation protocol: Yes   Plan:  - No further warfarin today -  Daily INR  Brittian Renaldo, Drake Leachachel Lynn 08/11/2014,4:17 PM

## 2014-08-11 NOTE — ED Provider Notes (Addendum)
Medical screening examination/treatment/procedure(s) were conducted as a shared visit with non-physician practitioner(s) and myself.  I personally evaluated the patient during the encounter.   EKG Interpretation   Date/Time:  Wednesday August 11 2014 12:24:14 EST Ventricular Rate:  57 PR Interval:  169 QRS Duration: 84 QT Interval:  411 QTC Calculation: 400 R Axis:   -45 Text Interpretation:  Sinus rhythm Multiform ventricular premature  complexes Inferior infarct, old Consider anterior infarct Confirmed by  Zayd Bonet  MD, Niles Ess [phone]) on 08/11/2014 12:28:54 PM      Results for orders placed or performed during the hospital encounter of 08/11/14  CBG monitoring, ED  Result Value Ref Range   Glucose-Capillary 139 (H) 70 - 99 mg/dL   Results for orders placed or performed during the hospital encounter of 08/11/14  CBC WITH DIFFERENTIAL  Result Value Ref Range   WBC 10.8 (H) 4.0 - 10.5 K/uL   RBC 4.82 4.22 - 5.81 MIL/uL   Hemoglobin 15.3 13.0 - 17.0 g/dL   HCT 60.4 54.0 - 98.1 %   MCV 91.7 78.0 - 100.0 fL   MCH 31.7 26.0 - 34.0 pg   MCHC 34.6 30.0 - 36.0 g/dL   RDW 19.1 47.8 - 29.5 %   Platelets 184 150 - 400 K/uL   Neutrophils Relative % 82 (H) 43 - 77 %   Neutro Abs 8.8 (H) 1.7 - 7.7 K/uL   Lymphocytes Relative 9 (L) 12 - 46 %   Lymphs Abs 1.0 0.7 - 4.0 K/uL   Monocytes Relative 7 3 - 12 %   Monocytes Absolute 0.8 0.1 - 1.0 K/uL   Eosinophils Relative 2 0 - 5 %   Eosinophils Absolute 0.2 0.0 - 0.7 K/uL   Basophils Relative 1 0 - 1 %   Basophils Absolute 0.1 0.0 - 0.1 K/uL  Urinalysis, Routine w reflex microscopic  Result Value Ref Range   Color, Urine AMBER (A) YELLOW   APPearance CLEAR CLEAR   Specific Gravity, Urine 1.021 1.005 - 1.030   pH 6.5 5.0 - 8.0   Glucose, UA NEGATIVE NEGATIVE mg/dL   Hgb urine dipstick SMALL (A) NEGATIVE   Bilirubin Urine NEGATIVE NEGATIVE   Ketones, ur 15 (A) NEGATIVE mg/dL   Protein, ur 621 (A) NEGATIVE mg/dL   Urobilinogen, UA 1.0  0.0 - 1.0 mg/dL   Nitrite NEGATIVE NEGATIVE   Leukocytes, UA MODERATE (A) NEGATIVE  Urine microscopic-add on  Result Value Ref Range   Squamous Epithelial / LPF RARE RARE   WBC, UA 21-50 <3 WBC/hpf   RBC / HPF 7-10 <3 RBC/hpf   Bacteria, UA FEW (A) RARE   Urine-Other MUCOUS PRESENT   I-stat chem 8, ed  Result Value Ref Range   Sodium 142 135 - 145 mmol/L   Potassium 4.1 3.5 - 5.1 mmol/L   Chloride 106 96 - 112 mmol/L   BUN 24 (H) 6 - 23 mg/dL   Creatinine, Ser 3.08 (H) 0.50 - 1.35 mg/dL   Glucose, Bld 657 (H) 70 - 99 mg/dL   Calcium, Ion 8.46 9.62 - 1.30 mmol/L   TCO2 23 0 - 100 mmol/L   Hemoglobin 15.6 13.0 - 17.0 g/dL   HCT 95.2 84.1 - 32.4 %  I-stat troponin, ED (not at [facility])  Result Value Ref Range   Troponin i, poc 0.00 0.00 - 0.08 ng/mL   Comment 3          CBG monitoring, ED  Result Value Ref Range   Glucose-Capillary 139 (H)  70 - 99 mg/dL    No results found.  [facility] was due to have a left  neck cyst removed. Patient had several syncopal episodes associated with hypotension. patient normally doesn't have trouble with vasovagal problems. Here blood pressure is above 90 but less than 100. Patient will need workup for the syncopal episodes may have been vasovagal patient is on Coumadin. However may really require admission for cardiac monitoring and further evaluation to make sure that is no problems with additional hypotensive episodes.   Patient does appear to have a urinary tract infection based on the labs. This may have contributed to the vasovagal episodes. Patient still not feeling well. Patient would pass out when they would try to sit him out so this may have been pure vasovagal but the overall picture is a little sketchy they were having trouble getting him to be able to be well enough to get out of the office probably best if he's admitted continued with some fluid treated for the urinary tract infection and continue  cardiac monitoring. We'll discuss with admitting team.   Vanetta MuldersScott Kunaal Walkins, MD 08/11/14 1327  Vanetta MuldersScott Payden Docter, MD 08/11/14 [phone]

## 2014-08-11 NOTE — ED Notes (Signed)
Pt brought in via [facility]. [facility] for [facility] left neck cyst removal. Pt got hypotensive and had 3 syncopal episodes. Pt has a history of dementia and is A/O x's 1 to self. EMS vital signs are 124/78, HR 61, CBG 195.

## 2014-08-11 NOTE — ED Notes (Signed)
PA abigail notified that pts occult blood stool was negative.

## 2014-08-11 NOTE — Consult Note (Signed)
Reason for Consult:   Syncope and collapse  Requesting Physician: Claybon Jabs  HPI:   79 year old male with a history of HTN, atrial fib with prior MAZE (January 2013), BPH with prior TURP, chronic anticoagulation, CAD with CABG which include LIMA to LAD, SVG to intermediate, and SVG to distal RCA with his MAZE and MV repair in January of 2013, CKD, and past syncope related to heat exposure. Prior echo from January of 2013 - had mild to moderate LV dysfunction this improved on echo from August of 2014.            Today at his PCP's office he was having a carbuncle lanced when he had syncope. In ED labs remarkable for WBCs in urine. No fever. B/P on the low side. EKG shows NSR sinus brady with PVCs. Since he say Dr Swaz[place]and last in Jan 2016 he has had no cardiac issues.    PMHx:  Past Medical History  Diagnosis Date  . Hypertension   . Atrial fibrillation 06/2011    S/p left sided MAZE. On Coumadin, January, 2013  . Syncope     Near-syncope, etiology is deemed secondary to the excessive heat  . Benign prostatic hypertrophy     but stable  . Chronic anticoagulation   . Coronary artery disease 06/2011    S/p LIMA-LAD, SVG-intermediate coronary (?), and SVG-distal RCA with concurrent MV repair and MAZE  . Mitral regurgitation 06/2011    Mitral valve ring annuloplasty at the time of CABG,, January, 2013  . Shortness of breath   . Chronic kidney disease 12/2010    with a creatinine on discharge of 1.84, was  2.1 on admission  . Calculus of kidney   . H/O hiatal hernia   . Ejection fraction < 50%     EF previously 50%, however 20-25% by echo and catheterization before CABG  . Chronic kidney disease   . Foley catheter in place     Past Surgical History  Procedure Laterality Date  . Circumcision  08/12/2002    because of phimosis  . Maze  06/06/2011    Procedure: MAZE;  Surgeon: [name], MD;  Location[facility] OR;  Service: Open Heart Surgery;  Laterality: N/A;  .  Mitral valve repair  06/06/2011    Procedure: MITRAL VALVE REPAIR (MVR);  Surgeon: [name], MD;  Location: [facility] OR;  Service: Open Heart Surgery;  Laterality: N/A;  . Coronary artery bypass graft  06/06/2011     grafts times three using left internal mammary artery and right leg greater saphenous vein harvested endoscopically  . Saturation biopsy of prostate    . Lithotripsy    . Transurethral resection of prostate  02/13/2012    Procedure: TRANSURETHRAL RESECTION OF THE PROSTATE WITH GYRUS INSTRUMENTS;  Surgeon: Milford Cage, MD;  Location: [facility] ORS;  Service: Urology;  Laterality: N/A;  . Cystoscopy  02/13/2012    Procedure: CYSTOSCOPY;  Surgeon: Milford Cage, MD;  Location: [facility] ORS;  Service: Urology;  Laterality: N/A;    SOCHx:  reports that he quit smoking about 50 years ago. His smoking use included Cigarettes. He quit after 45 years of use. He has quit using smokeless tobacco. His smokeless tobacco use included Chew. He reports that he does not drink alcohol or use illicit drugs.  FAMHx: Family History  Problem Relation Age of Onset  . Cancer Father   . Lung cancer Father   .  Diabetes Father   . Hypertension Mother   . Hypertension Brother     ALLERGIES: No Known Allergies  ROS: Pertinent items are noted in HPI. See H&P for complete ROS  HOME MEDICATIONS: Prior to Admission medications   Medication Sig Start Date End Date Taking? Authorizing Provider  amLODipine (NORVASC) 5 MG tablet Take 5 mg by mouth daily.  08/13/13  Yes Historical Provider, MD  atorvastatin (LIPITOR) 40 MG tablet TAKE 1 TABLET BY MOUTH DAILY AFTER BREAKFAST Patient taking differently: Take 20 mg by mouth daily at 6 PM.  06/07/14  Yes Peter M Swaziland, MD  carvedilol (COREG) 12.5 MG tablet Take 12.5 mg by mouth 2 (two) times daily with a meal.   Yes Historical Provider, MD  donepezil (ARICEPT) 10 MG tablet Take 10 mg by mouth at bedtime.   Yes Historical Provider, MD  finasteride  (PROSCAR) 5 MG tablet Take 5 mg by mouth daily.  08/07/13  Yes Historical Provider, MD  lisinopril (PRINIVIL,ZESTRIL) 20 MG tablet Take 20 mg by mouth daily.  02/25/12  Yes Marden Noble, MD  potassium chloride (MICRO-K) 10 MEQ CR capsule Take 10 mEq by mouth 2 (two) times daily.   Yes Historical Provider, MD  vitamin B-12 (CYANOCOBALAMIN) 1000 MCG tablet Take 1,000 mcg by mouth daily.   Yes Historical Provider, MD  warfarin (COUMADIN) 3 MG tablet Take as directed by coumadin clinic Patient taking differently: Take 3-6 mg by mouth daily.  daily except for  on Sundays 04/21/12  Yes Peter M [place], MD    HOSPITAL MEDICATIONS: I have reviewed the patient's current medications.  VITALS: Blood pressure 110/75, pulse 64, temperature 97.8 F (36.6 C), temperature source Oral, resp. rate 16, height 6' (1.829 m), weight 193 lb 4.8 oz (87.68 kg), SpO2 99 %.  PHYSICAL EXAM: General appearance: alert, cooperative and no distress Lungs: clear to auscultation bilaterally Heart: regular rate and rhythm Abdomen: soft, non-tender; bowel sounds normal; no masses,  no organomegaly Extremities: extremities normal, atraumatic, no cyanosis or edema Pulses: 2+ and symmetric Skin: Skin color, texture, turgor normal. No rashes or lesions Neurologic: Grossly normal  LABS: Results for orders placed or performed during the hospital encounter of 08/11/14 (from the past 24 hour(s))  CBG monitoring, ED     Status: Abnormal   Collection Time: 08/11/14 12:55 PM  Result Value Ref Range   Glucose-Capillary 139 (H) 70 - 99 mg/dL  Urinalysis, Routine w reflex microscopic     Status: Abnormal   Collection Time: 08/11/14  1:06 PM  Result Value Ref Range   Color, Urine AMBER (A) YELLOW   APPearance CLEAR CLEAR   Specific Gravity, Urine 1.021 1.005 - 1.030   pH 6.5 5.0 - 8.0   Glucose, UA NEGATIVE NEGATIVE mg/dL   Hgb urine dipstick SMALL (A) NEGATIVE   Bilirubin Urine NEGATIVE NEGATIVE   Ketones, ur 15 (A)  NEGATIVE mg/dL   Protein, ur 161 (A) NEGATIVE mg/dL   Urobilinogen, UA 1.0 0.0 - 1.0 mg/dL   Nitrite NEGATIVE NEGATIVE   Leukocytes, UA MODERATE (A) NEGATIVE  Urine microscopic-add on     Status: Abnormal   Collection Time: 08/11/14  1:06 PM  Result Value Ref Range   Squamous Epithelial / LPF RARE RARE   WBC, UA 21-50 <3 WBC/hpf   RBC / HPF 7-10 <3 RBC/hpf   Bacteria, UA FEW (A) RARE   Urine-Other MUCOUS PRESENT   CBC WITH DIFFERENTIAL     Status: Abnormal   Collection Time: 08/11/14  1:54 PM  Result Value Ref Range   WBC 10.8 (H) 4.0 - 10.5 K/uL   RBC 4.82 4.22 - 5.81 MIL/uL   Hemoglobin 15.3 13.0 - 17.0 g/dL   HCT 65.744.2 84.639.0 - 96.252.0 %   MCV 91.7 78.0 - 100.0 fL   MCH 31.7 26.0 - 34.0 pg   MCHC 34.6 30.0 - 36.0 g/dL   RDW 95.213.1 84.111.5 - 32.415.5 %   Platelets 184 150 - 400 K/uL   Neutrophils Relative % 82 (H) 43 - 77 %   Neutro Abs 8.8 (H) 1.7 - 7.7 K/uL   Lymphocytes Relative 9 (L) 12 - 46 %   Lymphs Abs 1.0 0.7 - 4.0 K/uL   Monocytes Relative 7 3 - 12 %   Monocytes Absolute 0.8 0.1 - 1.0 K/uL   Eosinophils Relative 2 0 - 5 %   Eosinophils Absolute 0.2 0.0 - 0.7 K/uL   Basophils Relative 1 0 - 1 %   Basophils Absolute 0.1 0.0 - 0.1 K/uL  Comprehensive metabolic panel     Status: Abnormal   Collection Time: 08/11/14  1:54 PM  Result Value Ref Range   Sodium 140 135 - 145 mmol/L   Potassium 4.0 3.5 - 5.1 mmol/L   Chloride 106 96 - 112 mmol/L   CO2 27 19 - 32 mmol/L   Glucose, Bld 171 (H) 70 - 99 mg/dL   BUN 23 6 - 23 mg/dL   Creatinine, Ser 4.012.01 (H) 0.50 - 1.35 mg/dL   Calcium 9.3 8.4 - 02.710.5 mg/dL   Total Protein 7.2 6.0 - 8.3 g/dL   Albumin 3.5 3.5 - 5.2 g/dL   AST 25 0 - 37 U/L   ALT 18 0 - 53 U/L   Alkaline Phosphatase 66 39 - 117 U/L   Total Bilirubin 0.5 0.3 - 1.2 mg/dL   GFR calc non Af Amer 31 (L) >90 mL/min   GFR calc Af Amer 35 (L) >90 mL/min   Anion gap 7 5 - 15  Protime-INR     Status: Abnormal   Collection Time: 08/11/14  1:54 PM  Result Value Ref Range    Prothrombin Time 29.8 (H) 11.6 - 15.2 seconds   INR 2.81 (H) 0.00 - 1.49  I-stat chem 8, ed     Status: Abnormal   Collection Time: 08/11/14  2:10 PM  Result Value Ref Range   Sodium 142 135 - 145 mmol/L   Potassium 4.1 3.5 - 5.1 mmol/L   Chloride 106 96 - 112 mmol/L   BUN 24 (H) 6 - 23 mg/dL   Creatinine, Ser 2.531.80 (H) 0.50 - 1.35 mg/dL   Glucose, Bld 664170 (H) 70 - 99 mg/dL   Calcium, Ion 4.031.19 4.741.13 - 1.30 mmol/L   TCO2 23 0 - 100 mmol/L   Hemoglobin 15.6 13.0 - 17.0 g/dL   HCT 25.946.0 56.339.0 - 87.552.0 %  I-stat troponin, ED (not at [facility])     Status: None   Collection Time: 08/11/14  2:10 PM  Result Value Ref Range   Troponin i, poc 0.00 0.00 - 0.08 ng/mL   Comment 3          POC occult blood, ED     Status: None   Collection Time: 08/11/14  2:53 PM  Result Value Ref Range   Fecal Occult Bld NEGATIVE NEGATIVE    EKG:   IMAGING: Dg Chest 2 View  08/11/2014   CLINICAL DATA:  Chest pain and shortness of breath today, initial  encounter.  EXAM: CHEST  2 VIEW  COMPARISON:  02/24/2012.  FINDINGS: Trachea is midline. Heart size normal. Minimal streaky atelectasis or scarring at the lung bases. No airspace consolidation or pleural fluid.  IMPRESSION: No acute findings.   Electronically Signed   By: Leanna Battles M.D.   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 Check echo (already ordered), observe on telemetry. Hold Zestril and Norvasc till B/P stabilizes.   Time Spent Directly with Patient:  40 minutes  Abelino Derrick [phone] beeper 08/11/2014,  6:01 PM    Agree with note written by Corine Shelter [facility]  ATSP with witnessed syncope. H/O CAD, MV repair and maze for PAF. He saw Dr. Garth Bigness in Jan and was doing well until today. He had a minor OP surgical procedure in his PCPs office ( carbuncle lanced) and passed out 3 times after that. No CP/SOB. Exam benign. Labs OK. EKG w/o acute changes. Rhythm NSR. ? Vasovagal syncope after surgical procedure. Will monitor overnight and prob D/Cd home Am.  Runell Gess 08/11/2014 6:14 PM

## 2014-08-11 NOTE — H&P (Addendum)
[facility]        History and Physical  Troy Gregory:[id number] DOB: 11/06/1935 DOA: 08/11/2014  Referring physician: ER physician PCP: Pearla Dubonnet, MD   Chief Complaint: syncope  HPI:  79 year old male with a past medical history of atrial fibrillation on AC with coumadin, CAD, chronic CHF, mitral regurgitation, CKD stage 3 who presented to [facility] ED from PCP office for evaluation of passing out 3 times and having hypotension. No reports of lightheadedness or dizziness prior to syncope. No chest pain, palpitations or shortness of breath . No complaints of fevers or chills. No abdominal pain, nausea or vomiting. No GU complaints. In ED, BP was 96/48 but somewhat better with IV fluids, 123/81, HR 58-64, RR 11-20, oxygen saturation 95% on room air. The 12 lead EKG showed sinus rhythm with PVC's. Blood work revealed WBC count 10.8, INR 2.81, creatinine 2.01 (around pt's baseline). UA showed moderate leukocytes with 21-50 WBC. CXR showed no acute cardiopulmonary findings. He was admitted for further evaluation of syncope. ED consulted cardio.   Assessment & Plan    Principal Problem:   Syncope and collapse - Unclear etiology but suspect vasovagal / orthostatic due to hypotension - BP on admission 96/48 - Cycle cardiac enzymes and obtain 2 D ECHO - PT evaluation - Appreciate cardio consult  Active Problems:   Atrial fibrillation, chronic - CHADS2-Vasc score 3 - On AC with coumadin    Hypertension - BP meds on hold due to hypotension    Dyslipidemia - Continue atorvastatin     UTI (lower urinary tract infection) /  Leukocytosis - UA on admission with moderate leukocytes and 21-50 WBC - Started rocephin IV daily - Follow up urine culture results     Memory loss - Continue aricept    CKD (chronic kidney disease) stage 3, GFR 30-59 ml/min - Creatinine around 2 at baseline    Chronic combined systolic and diastolic CHF (congestive heart failure) -  Compensated - Obtain 2  D ECHO on this admission as part of syncope evaluation    DVT prophylaxis:  - On therapeutic anticoagulation with Coumadin   Radiological Exams on Admission: Dg Chest 2 View 08/11/2014  No acute findings.   Electronically Signed   By: Leanna Battles M.D.   On: 08/11/2014 13:33    EKG: sinus rhythm with PVC's  Code Status: Full Family Communication: Family not at the bedside  Disposition Plan: Admit for further evaluation, telemetry   Manson Passey, MD  [facility] Pager [phone]  Review of Systems:  Constitutional: Negative for fever, chills and malaise/fatigue. Negative for diaphoresis.  HENT: Negative for hearing loss, ear pain, nosebleeds, congestion, sore throat, neck pain, tinnitus and ear discharge.   Eyes: Negative for blurred vision, double vision, photophobia, pain, discharge and redness.  Respiratory: Negative for cough, hemoptysis, sputum production, shortness of breath, wheezing and stridor.   Cardiovascular: Negative for chest pain, palpitations, orthopnea, claudication and leg swelling.  Gastrointestinal: Negative for nausea, vomiting and abdominal pain. Negative for heartburn, constipation, blood in stool and melena.  Genitourinary: Negative for dysuria, urgency, frequency, hematuria and flank pain.  Musculoskeletal: Negative for myalgias, back pain, joint pain and falls.  Skin: Negative for itching and rash.  Neurological: per HPI Endo/Heme/Allergies: Negative for environmental allergies and polydipsia. Does not bruise/bleed easily.  Psychiatric/Behavioral: Negative for suicidal ideas. The patient is not nervous/anxious.      Past Medical History  Diagnosis Date  . Hypertension   .  Atrial fibrillation 06/2011    S/p left sided MAZE. On Coumadin, January, 2013  . Syncope     Near-syncope, etiology is deemed secondary to the excessive heat  . Benign prostatic hypertrophy     but stable  . Chronic anticoagulation   . Coronary  artery disease 06/2011    S/p LIMA-LAD, SVG-intermediate coronary (?), and SVG-distal RCA with concurrent MV repair and MAZE  . Mitral regurgitation 06/2011    Mitral valve ring annuloplasty at the time of CABG,, January, 2013  . Shortness of breath   . Chronic kidney disease 12/2010    with a creatinine on discharge of 1.84, was  2.1 on admission  . Calculus of kidney   . H/O hiatal hernia   . Ejection fraction < 50%     EF previously 50%, however 20-25% by echo and catheterization before CABG  . Chronic kidney disease   . Foley catheter in place    Past Surgical History  Procedure Laterality Date  . Circumcision  08/12/2002    because of phimosis  . Maze  06/06/2011    Procedure: MAZE;  Surgeon: Delight OvensEdward B Gerhardt, MD;  [facility];  Service: Open Heart Surgery;  Laterality: N/A;  . Mitral valve repair  06/06/2011    Procedure: MITRAL VALVE REPAIR (MVR);  Surgeon: Delight OvensEdward B Gerhardt, MD;  [facility];  Service: Open Heart Surgery;  Laterality: N/A;  . Coronary artery bypass graft  06/06/2011     grafts times three using left internal mammary artery and right leg greater saphenous vein harvested endoscopically  . Saturation biopsy of prostate    . Lithotripsy    . Transurethral resection of prostate  02/13/2012    Procedure: TRANSURETHRAL RESECTION OF THE PROSTATE WITH GYRUS INSTRUMENTS;  Surgeon: Milford Cageaniel Young Woodruff, MD;  Location: [facility] ORS;  Service: Urology;  Laterality: N/A;  . Cystoscopy  02/13/2012    Procedure: CYSTOSCOPY;  Surgeon: Milford Cageaniel Young Woodruff, MD;  Location: [facility] ORS;  Service: Urology;  Laterality: N/A;   Social History:  reports that he quit smoking about 50 years ago. His smoking use included Cigarettes. He quit after 45 years of use. He has quit using smokeless tobacco. His smokeless tobacco use included Chew. He reports that he does not drink alcohol or use illicit drugs.  No Known Allergies  Family History:  Family History  Problem Relation Age of Onset  .  Cancer Father   . Lung cancer Father   . Diabetes Father   . Hypertension Mother   . Hypertension Brother      Prior to Admission medications   Medication Sig Start Date End Date Taking? Authorizing Provider  amLODipine (NORVASC) 5 MG tablet Take 5 mg by mouth daily.  08/13/13   Historical Provider, MD  atorvastatin (LIPITOR) 40 MG tablet TAKE 1 TABLET BY MOUTH DAILY AFTER BREAKFAST 06/07/14   Peter M SwazilandJordan, MD  carvedilol (COREG) 3.125 MG tablet Take 1 tablet (3.125 mg total) by mouth 2 (two) times daily with a meal. 03/03/14   Peter M [place]azil[place]Jordan, MD  donepezil (ARICEPT) 10 MG tablet Take 10 mg by mouth at bedtime.    Historical Provider, MD  finasteride (PROSCAR) 5 MG tablet Take 5 mg by mouth daily.  08/07/13   Historical Provider, MD  fish oil-omega-3 fatty acids 1000 MG capsule Take 1 g by mouth daily.    Historical Provider, MD  lisinopril (PRINIVIL,ZESTRIL) 20 MG tablet Take 20 mg by mouth daily.  02/25/12  Marden Noble, MD  Multiple Vitamins-Minerals (PRESERVISION/LUTEIN) CAPS Take by mouth 2 (two) times daily.    Historical Provider, MD  potassium chloride SA (K-DUR,KLOR-CON) 20 MEQ tablet Take 0.5 tablets (10 mEq total) by mouth 2 (two) times daily. 07/12/14   Peter M [place], MD  vitamin B-12 (CYANOCOBALAMIN) 1000 MCG tablet Take 1,000 mcg by mouth daily.    Historical Provider, MD  warfarin (COUMADIN) 3 MG tablet Take as directed by coumadin clinic 04/21/12   Peter M [place], MD   Physical Exam: Filed Vitals:   08/11/14 1345 08/11/14 1400 08/11/14 1408 08/11/14 1515  BP: 123/81 123/87  123/88  Pulse: 60 59  62  Temp:   97.8 F (36.6 C)   TempSrc:   Rectal   Resp: 14     Height:      Weight:      SpO2: 96% 95%  96%    Physical Exam  Constitutional: Appears well-developed and well-nourished. No distress.  HENT: Normocephalic. No tonsillar erythema or exudates Eyes: Conjunctivae and EOM are normal. PERRLA, no scleral icterus.  Neck: Normal ROM. Neck supple. No JVD. No tracheal  deviation. No thyromegaly.  CVS: RRR, S1/S2 +, no murmurs, no gallops, no carotid bruit.  Pulmonary: Effort and breath sounds normal, no stridor, rhonchi, wheezes, rales.  Abdominal: Soft. BS +,  no distension, tenderness, rebound or guarding.  Musculoskeletal: Normal range of motion. No edema and no tenderness.  Lymphadenopathy: No lymphadenopathy noted, cervical, inguinal. Neuro: Alert. Normal reflexes, muscle tone coordination. No focal neurologic deficits. Skin: Skin is warm and dry. No rash noted.  No erythema. No pallor.  Psychiatric: Normal mood and affect. Behavior, judgment, thought content normal.   Labs on Admission:  Basic Metabolic Panel:  Recent Labs Lab 08/11/14 1354 08/11/14 1410  NA 140 142  K 4.0 4.1  CL 106 106  CO2 27  --   GLUCOSE 171* 170*  BUN 23 24*  CREATININE 2.01* 1.80*  CALCIUM 9.3  --    Liver Function Tests:  Recent Labs Lab 08/11/14 1354  AST 25  ALT 18  ALKPHOS 66  BILITOT 0.5  PROT 7.2  ALBUMIN 3.5   No results for input(s): LIPASE, AMYLASE in the last 168 hours. No results for input(s): AMMONIA in the last 168 hours. CBC:  Recent Labs Lab 08/11/14 1354 08/11/14 1410  WBC 10.8*  --   NEUTROABS 8.8*  --   HGB 15.3 15.6  HCT 44.2 46.0  MCV 91.7  --   PLT 184  --    Cardiac Enzymes: No results for input(s): CKTOTAL, CKMB, CKMBINDEX, TROPONINI in the last 168 hours. BNP: Invalid input(s): POCBNP CBG:  Recent Labs Lab 08/11/14 1255  GLUCAP 139*    If 7PM-7AM, please contact night-coverage [email or web address] Password [facility] 08/11/2014, 3:27 PM

## 2014-08-11 NOTE — ED Notes (Signed)
Attempted report x1. 

## 2014-08-11 NOTE — Progress Notes (Signed)
Melida GimenezJennifer King is a family member. Phone number is [phone]. Pt states we can talk to FairleeJennifer about pt care. Cecille Rubinhompson,Riona Lahti V, RN

## 2014-08-12 ENCOUNTER — Other Ambulatory Visit: Payer: Self-pay | Admitting: Cardiology

## 2014-08-12 DIAGNOSIS — N183 Chronic kidney disease, stage 3 (moderate): Secondary | ICD-10-CM

## 2014-08-12 DIAGNOSIS — Z951 Presence of aortocoronary bypass graft: Secondary | ICD-10-CM

## 2014-08-12 DIAGNOSIS — I5042 Chronic combined systolic (congestive) and diastolic (congestive) heart failure: Secondary | ICD-10-CM

## 2014-08-12 DIAGNOSIS — R55 Syncope and collapse: Secondary | ICD-10-CM

## 2014-08-12 LAB — COMPREHENSIVE METABOLIC PANEL
ALBUMIN: 3.3 g/dL — AB (ref 3.5–5.2)
ALT: 17 U/L (ref 0–53)
AST: 23 U/L (ref 0–37)
Alkaline Phosphatase: 63 U/L (ref 39–117)
Anion gap: 5 (ref 5–15)
BUN: 17 mg/dL (ref 6–23)
CALCIUM: 8.8 mg/dL (ref 8.4–10.5)
CO2: 27 mmol/L (ref 19–32)
Chloride: 107 mmol/L (ref 96–112)
Creatinine, Ser: 1.81 mg/dL — ABNORMAL HIGH (ref 0.50–1.35)
GFR calc Af Amer: 40 mL/min — ABNORMAL LOW (ref >90)
GFR calc non Af Amer: 35 mL/min — ABNORMAL LOW (ref >90)
Glucose, Bld: 126 mg/dL — ABNORMAL HIGH (ref 70–99)
POTASSIUM: 3.8 mmol/L (ref 3.5–5.1)
Sodium: 139 mmol/L (ref 135–145)
TOTAL PROTEIN: 6.9 g/dL (ref 6.0–8.3)
Total Bilirubin: 0.5 mg/dL (ref 0.3–1.2)

## 2014-08-12 LAB — PROTIME-INR
INR: 2.76 — AB (ref 0.00–1.49)
Prothrombin Time: 29.4 seconds — ABNORMAL HIGH (ref 11.6–15.2)

## 2014-08-12 LAB — CBC
HCT: 43.2 % (ref 39.0–52.0)
Hemoglobin: 14.5 g/dL (ref 13.0–17.0)
MCH: 31.3 pg (ref 26.0–34.0)
MCHC: 33.6 g/dL (ref 30.0–36.0)
MCV: 93.3 fL (ref 78.0–100.0)
PLATELETS: 190 10*3/uL (ref 150–400)
RBC: 4.63 MIL/uL (ref 4.22–5.81)
RDW: 13.1 % (ref 11.5–15.5)
WBC: 8.3 10*3/uL (ref 4.0–10.5)

## 2014-08-12 LAB — TROPONIN I

## 2014-08-12 LAB — GLUCOSE, CAPILLARY: Glucose-Capillary: 121 mg/dL — ABNORMAL HIGH (ref 70–99)

## 2014-08-12 MED ORDER — NITROFURANTOIN MONOHYD MACRO 100 MG PO CAPS: 100.0000 mg | ORAL_CAPSULE | Freq: Two times a day (BID) | ORAL | Status: DC

## 2014-08-12 NOTE — Discharge Instructions (Signed)
Syncope °Syncope is a medical term for fainting or passing out. This means you lose consciousness and drop to the ground. People are generally unconscious for less than 5 minutes. You may have some muscle twitches for up to 15 seconds before waking up and returning to normal. Syncope occurs more often in older adults, but it can happen to anyone. While most causes of syncope are not dangerous, syncope can be a sign of a serious medical problem. It is important to seek medical care.  °CAUSES  °Syncope is caused by a sudden drop in blood flow to the brain. The specific cause is often not determined. Factors that can bring on syncope include: °· Taking medicines that lower blood pressure. °· Sudden changes in posture, such as standing up quickly. °· Taking more medicine than prescribed. °· Standing in one place for too long. °· Seizure disorders. °· Dehydration and excessive exposure to heat. °· Low blood sugar (hypoglycemia). °· Straining to have a bowel movement. °· Heart disease, irregular heartbeat, or other circulatory problems. °· Fear, emotional distress, seeing blood, or severe pain. °SYMPTOMS  °Right before fainting, you may: °· Feel dizzy or light-headed. °· Feel nauseous. °· See all white or all black in your field of vision. °· Have cold, clammy skin. °DIAGNOSIS  °Your health care provider will ask about your symptoms, perform a physical exam, and perform an electrocardiogram (ECG) to record the electrical activity of your heart. Your health care provider may also perform other heart or blood tests to determine the cause of your syncope which may include: °· Transthoracic echocardiogram (TTE). During echocardiography, sound waves are used to evaluate how blood flows through your heart. °· Transesophageal echocardiogram (TEE). °· Cardiac monitoring. This allows your health care provider to monitor your heart rate and rhythm in real time. °· Holter monitor. This is a portable device that records your  heartbeat and can help diagnose heart arrhythmias. It allows your health care provider to track your heart activity for several days, if needed. °· Stress tests by exercise or by giving medicine that makes the heart beat faster. °TREATMENT  °In most cases, no treatment is needed. Depending on the cause of your syncope, your health care provider may recommend changing or stopping some of your medicines. °HOME CARE INSTRUCTIONS °· Have someone stay with you until you feel stable. °· Do not drive, use machinery, or play sports until your health care provider says it is okay. °· Keep all follow-up appointments as directed by your health care provider. °· Lie down right away if you start feeling like you might faint. Breathe deeply and steadily. Wait until all the symptoms have passed. °· Drink enough fluids to keep your urine clear or pale yellow. °· If you are taking blood pressure or heart medicine, get up slowly and take several minutes to sit and then stand. This can reduce dizziness. °SEEK IMMEDIATE MEDICAL CARE IF:  °· You have a severe headache. °· You have unusual pain in the chest, abdomen, or back. °· You are bleeding from your mouth or rectum, or you have black or tarry stool. °· You have an irregular or very fast heartbeat. °· You have pain with breathing. °· You have repeated fainting or seizure-like jerking during an episode. °· You faint when sitting or lying down. °· You have confusion. °· You have trouble walking. °· You have severe weakness. °· You have vision problems. °If you fainted, call your local emergency services (911 in [place] Do not drive   yourself to the hospital.  °MAKE SURE YOU: °· Understand these instructions. °· Will watch your condition. °· Will get help right away if you are not doing well or get worse. °Document Released: 05/21/2005 Document Revised: 05/26/2013 Document Reviewed: 07/20/2011 °ExitCare® Patient Information ©2015 [employer]. This information is not intended to replace  advice given to you by your health care provider. Make sure you discuss any questions you have with your health care provider. ° °

## 2014-08-12 NOTE — Discharge Summary (Signed)
Physician Discharge Summary  Troy Gregory:[id number] DOB: 11-14-1935 DOA: 08/11/2014  PCP: Pearla Dubonnet, MD  Admit date: 08/11/2014 Discharge date: 08/12/2014  Recommendations for Outpatient Follow-up:  1. Patient will follow-up with primary care physician per scheduled appointment. No changes made in medications. 2. Patient will take Macrobid for 5 days and discharge for urinary tract infection.  Discharge Diagnoses:  Principal Problem:   Syncope and collapse Active Problems:   PAF (paroxysmal atrial fibrillation)   Hypertension-hypotensive on admission   UTI (lower urinary tract infection)   Memory loss   CKD (chronic kidney disease) stage 3, GFR 30-59 ml/min   Chronic combined systolic and diastolic CHF-EF 14% Aug 2014   Leukocytosis   H/O mitral valve repair-2013   Hx of CABG 2013   PVC's (premature ventricular contractions)    Discharge Condition: stable   Diet recommendation: as tolerated   History of present illness: 79 year old male with a past medical history of atrial fibrillation on AC with coumadin, CAD, chronic CHF, mitral regurgitation, CKD stage 3 who presented to [facility] ED from PCP office for evaluation of passing out 3 times and having hypotension.   In ED, BP was 96/48 but somewhat better with IV fluids, 123/81, HR 58-64, RR 11-20, oxygen saturation 95% on room air. The 12 lead EKG showed sinus rhythm with PVC's. Blood work revealed WBC count 10.8, INR 2.81, creatinine 2.01 (around pt's baseline). UA showed moderate leukocytes with 21-50 WBC. CXR showed no acute cardiopulmonary findings. He was admitted for further evaluation of syncope. ED consulted cardio.   Assessment & Plan    Principal Problem:  Syncope and collapse - Unclear etiology but suspect vasovagal / orthostatic due to hypotension - Blood pressure improved, 147/89. - Cardiac enzymes within normal limits for total of 3 sets. - 2-D echo is pending, will follow final results prior to  discharge. - Patient is stable for discharge today. Patient insists on going home today. - No further recommendations from cardiology.  Active Problems:  Atrial fibrillation, chronic - CHADS2-Vasc score 3 - On AC with coumadin   Hypertension - BP meds were on hold due to hypotension - Blood pressure now 147/89, patient may resume blood pressure medications on discharge.   Dyslipidemia - Continue atorvastatin    UTI (lower urinary tract infection) / Leukocytosis - UA on admission with moderate leukocytes and 21-50 WBC - Started rocephin IV daily - Urine culture not collected at the time of the admission. Will treat empirically with Macrobid for 5 days on discharge.   Memory loss - Continue aricept   CKD (chronic kidney disease) stage 3, GFR 30-59 ml/min - Creatinine around 2 at baseline   Chronic combined systolic and diastolic CHF (congestive heart failure) - Compensated - We'll follow-up on 2-D echo prior to discharge. It is pending as of now.   DVT prophylaxis:  - On therapeutic anticoagulation with Coumadin  - Patient can resume Coumadin per prior home dosing.  Radiological Exams on Admission: Dg Chest 2 View 08/11/2014 No acute findings. Electronically Signed By: Leanna Battles M.D. On: 08/11/2014 13:33    EKG: sinus rhythm with PVC's    Signed:  Manson Passey, MD  [facility] 08/12/2014, 8:54 AM  Pager #: [phone]  Procedures:  None   Consultations:  Cardiology   Discharge Exam: Filed Vitals:   08/12/14 0804  BP: 147/89  Pulse: 70  Temp: 98.2 F (36.8 C)  Resp: 17   Filed Vitals:   08/11/14 2000 08/12/14 0000 08/12/14 0439 08/12/14  0804  BP: 136/83 128/78 138/81 147/89  Pulse: 62 58 57 70  Temp: 98.3 F (36.8 C) 97.8 F (36.6 C) 98.3 F (36.8 C) 98.2 F (36.8 C)  TempSrc: Oral Oral Oral Oral  Resp:   18 17  Height:      Weight:   87.862 kg (193 lb 11.2 oz)   SpO2: 99% 97% 97% 99%    General: Pt is alert,  follows commands appropriately, not in acute distress Cardiovascular: Regular rate and rhythm, S1/S2 +, no murmurs Respiratory: Clear to auscultation bilaterally, no wheezing, no crackles, no rhonchi Abdominal: Soft, non tender, non distended, bowel sounds +, no guarding Extremities: no edema, no cyanosis, pulses palpable bilaterally DP and PT Neuro: Grossly nonfocal  Discharge Instructions  Discharge Instructions    Call MD for:  difficulty breathing, headache or visual disturbances    Complete by:  As directed      Call MD for:  persistant nausea and vomiting    Complete by:  As directed      Call MD for:  severe uncontrolled pain    Complete by:  As directed      Diet - low sodium heart healthy    Complete by:  As directed      Increase activity slowly    Complete by:  As directed             Medication List    TAKE these medications        amLODipine 5 MG tablet  Commonly known as:  NORVASC  Take 5 mg by mouth daily.     atorvastatin 40 MG tablet  Commonly known as:  LIPITOR  TAKE 1 TABLET BY MOUTH DAILY AFTER BREAKFAST     carvedilol 12.5 MG tablet  Commonly known as:  COREG  Take 12.5 mg by mouth 2 (two) times daily with a meal.     donepezil 10 MG tablet  Commonly known as:  ARICEPT  Take 10 mg by mouth at bedtime.     finasteride 5 MG tablet  Commonly known as:  PROSCAR  Take 5 mg by mouth daily.     lisinopril 20 MG tablet  Commonly known as:  PRINIVIL,ZESTRIL  Take 20 mg by mouth daily.     potassium chloride 10 MEQ CR capsule  Commonly known as:  MICRO-K  Take 10 mEq by mouth 2 (two) times daily.     vitamin B-12 1000 MCG tablet  Commonly known as:  CYANOCOBALAMIN  Take 1,000 mcg by mouth daily.     warfarin 3 MG tablet  Commonly known as:  COUMADIN  Take as directed by coumadin clinic           Follow-up Information    Follow up with GATES,ROBERT NEVILL, MD. Schedule an appointment as soon as possible for a visit in 2 weeks.    Specialty:  Internal Medicine   Why:  Follow up appt after recent hospitalization   Contact information:   [address] [place] [address] [phone]        The results of significant diagnostics from this hospitalization (including imaging, microbiology, ancillary and laboratory) are listed below for reference.    Significant Diagnostic Studies: Dg Chest 2 View  08/11/2014   CLINICAL DATA:  Chest pain and shortness of breath today, initial encounter.  EXAM: CHEST  2 VIEW  COMPARISON:  02/24/2012.  FINDINGS: Trachea is midline. Heart size normal. Minimal streaky atelectasis or scarring at the  lung bases. No airspace consolidation or pleural fluid.  IMPRESSION: No acute findings.   Electronically Signed   By: Leanna Battles M.D.   On: 08/11/2014 13:33    Microbiology: No results found for this or any previous visit (from the past 240 hour(s)).   Labs: Basic Metabolic Panel:  Recent Labs Lab 08/11/14 1354 08/11/14 1410 08/12/14 0646  NA 140 142 139  K 4.0 4.1 3.8  CL 106 106 107  CO2 27  --  27  GLUCOSE 171* 170* 126*  BUN 23 24* 17  CREATININE 2.01* 1.80* 1.81*  CALCIUM 9.3  --  8.8   Liver Function Tests:  Recent Labs Lab 08/11/14 1354 08/12/14 0646  AST 25 23  ALT 18 17  ALKPHOS 66 63  BILITOT 0.5 0.5  PROT 7.2 6.9  ALBUMIN 3.5 3.3*   No results for input(s): LIPASE, AMYLASE in the last 168 hours. No results for input(s): AMMONIA in the last 168 hours. CBC:  Recent Labs Lab 08/11/14 1354 08/11/14 1410 08/12/14 0646  WBC 10.8*  --  8.3  NEUTROABS 8.8*  --   --   HGB 15.3 15.6 14.5  HCT 44.2 46.0 43.2  MCV 91.7  --  93.3  PLT 184  --  190   Cardiac Enzymes:  Recent Labs Lab 08/11/14 1842 08/11/14 2242 08/12/14 0646  TROPONINI <0.03 <0.03 <0.03   BNP: BNP (last 3 results) No results for input(s): BNP in the last 8760 hours.  ProBNP (last 3 results) No results for input(s): PROBNP in the last 8760  hours.  CBG:  Recent Labs Lab 08/11/14 1255 08/12/14 0623  GLUCAP 139* 121*    Time coordinating discharge: Over 30 minutes

## 2014-08-12 NOTE — Progress Notes (Signed)
PT Cancellation Note  Patient Details Name: Troy Gregory MRN: [id number] DOB: 03/05/1936   Cancelled Treatment:    Reason Eval/Treat Not Completed: Patient declined, no reason specified Pt declines PT services at this time. States he is discharging in a few minutes. EMS already in room for transport.  Berton MountBarbour, Shirlene Andaya S 08/12/2014, 9:09 AM Charlsie MerlesLogan Secor Jahmir Salo, [place] [phone]

## 2014-08-12 NOTE — Progress Notes (Signed)
Patient Profile: 79 year old male with a history of HTN, atrial fib with prior MAZE (January 2013), BPH with prior TURP, chronic anticoagulation, CAD with CABG which include LIMA to LAD, SVG to intermediate, and SVG to distal RCA with his MAZE and MV repair in January of 2013, CKD, and past syncope related to heat exposure. Prior echo from January of 2013 - had mild to moderate LV dysfunction this improved on echo from August of 2014.  08/11/14 at his PCP's office he was having a carbuncle lanced when he had syncope. In ED labs remarkable for WBCs in urine. No fever. B/P on the low side. EKG shows NSR/sinus brady with PVCs. Admitted for overnight telemetry monitoring.    Subjective: No further episodes. Denies CP and dyspnea.   Objective: Vital signs in last 24 hours: Temp:  [97.8 F (36.6 C)-98.3 F (36.8 C)] 98.2 F (36.8 C) (03/10 0804) Pulse Rate:  [57-70] 70 (03/10 0804) Resp:  [11-20] 17 (03/10 0804) BP: (96-147)/(48-90) 147/89 mmHg (03/10 0804) SpO2:  [95 %-99 %] 99 % (03/10 0804) Weight:  [193 lb 4.8 oz (87.68 kg)-205 lb (92.987 kg)] 193 lb 11.2 oz (87.862 kg) (03/10 0439) Last BM Date: 08/10/14  Intake/Output from previous day: 03/09 0701 - 03/10 0700 In: 1320 [P.O.:720; I.V.:600] Out: 575 [Urine:575] Intake/Output this shift: Total I/O In: 120 [P.O.:120] Out: -   Medications Current Facility-Administered Medications  Medication Dose Route Frequency Provider Last Rate Last Dose  . 0.9 %  sodium chloride infusion   Intravenous Continuous Alison MurrayAlma M Devine, MD 50 mL/hr at 08/12/14 0600    . acetaminophen (TYLENOL) tablet 650 mg  650 mg Oral Q6H PRN Alison MurrayAlma M Devine, MD   650 mg at 08/11/14 2150   Or  . acetaminophen (TYLENOL) suppository 650 mg  650 mg Rectal Q6H PRN Alison MurrayAlma M Devine, MD      . atorvastatin (LIPITOR) tablet 40 mg  40 mg Oral q1800 Alison MurrayAlma M Devine, MD   40 mg at 08/11/14 1819  . cefTRIAXone (ROCEPHIN) 1 g in dextrose 5 % 50 mL IVPB  1 g Intravenous Q24H Alison MurrayAlma M  Devine, MD      . donepezil (ARICEPT) tablet 10 mg  10 mg Oral QHS Alison MurrayAlma M Devine, MD   10 mg at 08/11/14 2150  . finasteride (PROSCAR) tablet 5 mg  5 mg Oral Daily Alison MurrayAlma M Devine, MD      . omega-3 acid ethyl esters (LOVAZA) capsule 1 g  1 g Oral Daily Alison MurrayAlma M Devine, MD   1 g at 08/11/14 1819  . ondansetron (ZOFRAN) tablet 4 mg  4 mg Oral Q6H PRN Alison MurrayAlma M Devine, MD       Or  . ondansetron [facility](ZOFRAN) injection 4 mg  4 mg Intravenous Q6H PRN Alison MurrayAlma M Devine, MD      . potassium chloride (K-DUR,KLOR-CON) CR tablet 10 mEq  10 mEq Oral BID Alison MurrayAlma M Devine, MD   10 mEq at 08/11/14 2150  . sodium chloride 0.9 % injection 3 mL  3 mL Intravenous Q12H Alison MurrayAlma M Devine, MD   3 mL at 08/11/14 2151  . Warfarin - Pharmacist Dosing Inpatient   Does not apply q1800 Rosaland Laoachel L Rumbarger, [facility]        PE: General appearance: alert, cooperative and no distress Neck: no carotid bruit and no JVD Lungs: clear to auscultation bilaterally Heart: regular rate and rhythm, S1, S2 normal, no murmur, click, rub or gallop Extremities: no LEE Pulses: 2+ and symmetric  Skin: warm and dry Neurologic: Grossly normal  Lab Results:   Recent Labs  08/11/14 1354 08/11/14 1410 08/12/14 0646  WBC 10.8*  --  8.3  HGB 15.3 15.6 14.5  HCT 44.2 46.0 43.2  PLT 184  --  190   BMET  Recent Labs  08/11/14 1354 08/11/14 1410 08/12/14 0646  NA 140 142 139  K 4.0 4.1 3.8  CL 106 106 107  CO2 27  --  27  GLUCOSE 171* 170* 126*  BUN 23 24* 17  CREATININE 2.01* 1.80* 1.81*  CALCIUM 9.3  --  8.8   PT/INR  Recent Labs  08/11/14 1354 08/12/14 0646  LABPROT 29.8* 29.4*  INR 2.81* 2.76*   Cardiac Panel (last 3 results)  Recent Labs  08/11/14 1842 08/11/14 2242 08/12/14 0646  TROPONINI <0.03 <0.03 <0.03     Assessment/Plan    Principal Problem:   Syncope and collapse Active Problems:   PAF (paroxysmal atrial fibrillation)   Hypertension-hypotensive on admission   H/O mitral valve repair-2013   Hx of CABG 2013    UTI (lower urinary tract infection)   Memory loss   PVC's (premature ventricular contractions)   CKD (chronic kidney disease) stage 3, GFR 30-59 ml/min   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 Syncope: occurred after OP surgical procedure (cabuncle lanced). Cardiac enzymes are negative x 3. EKG Sinus brady with rate of 57. BP stable at 147/89. Occasional isolated PVCs on telemetry but no arrhthymias or severe bradycardia. No further episodes. Suspect vasovagal syncope. No further cardiac w/u needed.    LOS: 1 day    Brittainy M. Delmer Islam 08/12/2014 8:39 AM  Agree with above. Patient was discharged before I was able to examine him.  See prior consult note.   Donato Schultz, MD

## 2014-08-12 NOTE — Telephone Encounter (Signed)
Rx has been sent to the pharmacy electronically. ° °

## 2014-08-12 NOTE — Progress Notes (Signed)
Pt discharged via wheelchair, condition stable, accompained by family

## 2014-08-13 NOTE — Progress Notes (Signed)
UR Completed Mayan Dolney Graves-Bigelow, RN,BSN [phone]  

## 2014-08-19 ENCOUNTER — Other Ambulatory Visit: Payer: Self-pay | Admitting: Cardiology

## 2015-03-25 ENCOUNTER — Other Ambulatory Visit: Payer: Self-pay

## 2015-03-25 MED ORDER — ATORVASTATIN CALCIUM 40 MG PO TABS: ORAL_TABLET | ORAL | Status: AC

## 2015-06-30 ENCOUNTER — Other Ambulatory Visit: Payer: Self-pay | Admitting: Cardiology

## 2015-07-18 ENCOUNTER — Encounter: Payer: Medicare Other | Admitting: Cardiology

## 2015-07-19 NOTE — Progress Notes (Signed)
Erroneous encounter  Crucita Lacorte MD, FACC   

## 2015-07-20 ENCOUNTER — Encounter: Payer: Self-pay | Admitting: *Deleted

## 2015-08-05 ENCOUNTER — Other Ambulatory Visit: Payer: Self-pay | Admitting: Cardiology

## 2015-09-10 ENCOUNTER — Other Ambulatory Visit: Payer: Self-pay | Admitting: Cardiology

## 2015-09-12 NOTE — Telephone Encounter (Signed)
Rx request sent to pharmacy.  

## 2015-11-16 ENCOUNTER — Encounter: Payer: Self-pay | Admitting: Cardiology

## 2015-11-16 ENCOUNTER — Ambulatory Visit (INDEPENDENT_AMBULATORY_CARE_PROVIDER_SITE_OTHER): Payer: Medicare Other | Admitting: Cardiology

## 2015-11-16 VITALS — BP 116/82 | HR 69 | Ht 73.0 in | Wt 192.0 lb

## 2015-11-16 DIAGNOSIS — Z951 Presence of aortocoronary bypass graft: Secondary | ICD-10-CM | POA: Diagnosis not present

## 2015-11-16 DIAGNOSIS — I480 Paroxysmal atrial fibrillation: Secondary | ICD-10-CM

## 2015-11-16 DIAGNOSIS — I5042 Chronic combined systolic (congestive) and diastolic (congestive) heart failure: Secondary | ICD-10-CM | POA: Diagnosis not present

## 2015-11-16 DIAGNOSIS — Z9889 Other specified postprocedural states: Secondary | ICD-10-CM | POA: Diagnosis not present

## 2015-11-16 NOTE — Patient Instructions (Signed)
Continue your current therapy  I will see you in one year   

## 2015-11-16 NOTE — Progress Notes (Signed)
Troy Gregory Date of Birth: 04-15-1936 Medical Record #[id number]  History of Present Illness: Troy Gregory is seen for follow up of CAD. He is a 80 year old male with a history of HTN, atrial fib with prior MAZE (January 2013), BPH with prior TURP, chronic anticoagulation, CAD with  CABG  which include LIMA to LAD, SVG to intermediate, and SVG to distal RCA with his MAZE and MV repair in January of 2013, CKD, and past syncope related to heat exposure. Prior echo from January of 2013 - had mild to moderate LV dysfunction this improved on echo from August of 2014. He was admitted in March 2016 with a syncopal episode after having a cyst on his neck lanced. Work up was negative. No recurrence.   He is doing very well from a cardiac standpoint. No  chest pain. No palpitations, dyspnea, or dizziness. Still dealing with memory loss and is on Aricept. Main activity is walking out to the barn to check on his horses.   Current Outpatient Prescriptions  Medication Sig Dispense Refill  . amLODipine (NORVASC) 5 MG tablet Take 5 mg by mouth daily.     Marland Kitchen atorvastatin (LIPITOR) 40 MG tablet TAKE 1 TABLET BY MOUTH DAILY AFTER BREAKFAST 30 tablet 6  . carvedilol (COREG) 3.125 MG tablet TAKE 1 TABLET BY MOUTH TWICE DAILY WITH MEALS 60 tablet 2  . donepezil (ARICEPT) 10 MG tablet Take 10 mg by mouth at bedtime.    . finasteride (PROSCAR) 5 MG tablet Take 5 mg by mouth daily.     Marland Kitchen lisinopril (PRINIVIL,ZESTRIL) 20 MG tablet Take 20 mg by mouth daily.     . nitrofurantoin, macrocrystal-monohydrate, (MACROBID) 100 MG capsule Take 1 capsule (100 mg total) by mouth 2 (two) times daily. 10 capsule 0  . potassium chloride (MICRO-K) 10 MEQ CR capsule Take 10 mEq by mouth 2 (two) times daily.    . potassium chloride SA (K-DUR,KLOR-CON) 20 MEQ tablet TAKE 1/2 TABLET BY MOUTH TWICE DAILY 30 tablet 2  . vitamin B-12 (CYANOCOBALAMIN) 1000 MCG tablet Take 1,000 mcg by mouth daily.    Marland Kitchen warfarin (COUMADIN) 3 MG tablet Take as  directed by coumadin clinic (Patient taking differently: Take 3-6 mg by mouth daily.  daily except for  on Sundays) 40 tablet 3   No current facility-administered medications for this visit.    No Known Allergies  Past Medical History  Diagnosis Date  . Hypertension   . Atrial fibrillation (HCC) 06/2011    S/p left sided MAZE. On Coumadin, January, 2013  . Syncope     Near-syncope, etiology is deemed secondary to the excessive heat  . Benign prostatic hypertrophy     but stable  . Chronic anticoagulation   . Coronary artery disease 06/2011    S/p LIMA-LAD, SVG-intermediate coronary (?), and SVG-distal RCA with concurrent MV repair and MAZE  . Mitral regurgitation 06/2011    Mitral valve ring annuloplasty at the time of CABG,, January, 2013  . Shortness of breath   . H/O hiatal hernia   . Ejection fraction < 50%     EF previously 50%, however 20-25% by echo and catheterization before CABG  . High cholesterol   . Myocardial infarction [facility])     "I believe I've had a heart attack; don't remember when" (3/92016)  . Arthritis     "a little in my joints" (08/11/2014)  . Chronic kidney disease 12/2010    with a creatinine on discharge of 1.84, was  2.1 on admission  . Calculus of kidney   . Chronic kidney disease (CKD), stage III (moderate)     [name]/notes 08/11/2014    Past Surgical History  Procedure Laterality Date  . Circumcision  08/12/2002    because of phimosis  . Maze  06/06/2011    Procedure: MAZE;  Surgeon: Delight OvensEdward B Gerhardt, MD;  [facility];  Service: Open Heart Surgery;  Laterality: N/A;  . Mitral valve repair  06/06/2011    Procedure: MITRAL VALVE REPAIR (MVR);  Surgeon: Delight OvensEdward B Gerhardt, MD;  [facility];  Service: Open Heart Surgery;  Laterality: N/A;  . Saturation biopsy of prostate    . Lithotripsy    . Transurethral resection of prostate  02/13/2012    Procedure: TRANSURETHRAL RESECTION OF THE PROSTATE WITH GYRUS INSTRUMENTS;  Surgeon: Milford Cageaniel Young Woodruff,  MD;  Location: [facility] ORS;  Service: Urology;  Laterality: N/A;  . Cystoscopy  02/13/2012    Procedure: CYSTOSCOPY;  Surgeon: Milford Cageaniel Young Woodruff, MD;  Location: [facility] ORS;  Service: Urology;  Laterality: N/A;  . Tonsillectomy    . Coronary artery bypass graft  06/06/2011     grafts times three using left internal mammary artery and right leg greater saphenous vein harvested endoscopically  . Transurethral resection of prostate      hx/notes 08/11/2014    History  Smoking status  . Former Smoker -- 1.00 packs/day for 45 years  . Types: Cigarettes, Cigars  . Quit date: 07/01/1964  Smokeless tobacco  . Former NeurosurgeonUser  . Types: Chew    History  Alcohol Use  . Yes    Comment: 08/11/2014 "a couple shots maybe once/year now"    Family History  Problem Relation Age of Onset  . Cancer Father   . Lung cancer Father   . Diabetes Father   . Hypertension Mother   . Hypertension Brother     Review of Systems: The review of systems is per the HPI.  All other systems were reviewed and are negative.  Physical Exam: BP 116/82 mmHg  Pulse 69  Ht 6\' 1"  (1.854 m)  Wt 192 lb (87.091 kg)  BMI 25.34 kg/m2 Patient is very pleasant and in no acute distress. Skin is warm and dry. Color is normal.  HEENT is unremarkable. Normocephalic/atraumatic. PERRL. Sclera are nonicteric. Neck is supple. No masses. No JVD. Lungs are clear. Cardiac exam shows a regular rate and rhythm. Normal S1-2. No gallop or murmur. Abdomen is soft. Extremities are without edema. Gait and ROM are intact. No gross neurologic deficits noted.   LABORATORY DATA:  Lab Results  Component Value Date   WBC 8.3 08/12/2014   HGB 14.5 08/12/2014   HCT 43.2 08/12/2014   PLT 190 08/12/2014   GLUCOSE 126* 08/12/2014   CHOL 142 09/21/2011   TRIG 199.0* 09/21/2011   HDL 32.50* 09/21/2011   LDLCALC 70 09/21/2011   ALT 17 08/12/2014   AST 23 08/12/2014   NA 139 08/12/2014   K 3.8 08/12/2014   CL 107 08/12/2014   CREATININE 1.81*  08/12/2014   BUN 17 08/12/2014   CO2 27 08/12/2014   TSH 1.335 08/11/2014   PSA 3.77 06/04/2011   INR 2.76* 08/12/2014   HGBA1C 5.5 09/24/2011    Laboratory Data:  Echo Study Conclusions from August 2014  - Left ventricle: The cavity size was normal. Wall thickness was increased in a pattern of mild LVH. Systolic function was normal. The estimated ejection fraction was in the range of  55% to 60%. Wall motion was normal; there were no regional wall motion abnormalities. Features are consistent with a pseudonormal left ventricular filling pattern, with concomitant abnormal relaxation and increased filling pressure (grade 2 diastolic dysfunction). - Aortic valve: Mild regurgitation. - Mitral valve: Prior procedures included surgical repair. - Left atrium: The atrium was mildly to moderately dilated.  Ecg today: NSR, LAFB, poor R wave progression in the precordial leads with old anterior MI. Low voltage. I have personally reviewed and interpreted this study.   Assessment / Plan:  1. CAD - past CABG - doing well without angina. Follow up in one year. Continue current medications.    2. MV repair - follow up echo last August showed resolution of his LV dysfunction.   3. Atrial fib - was in sinus on last EKG and on exam today. On long term coumadin.   4. Dementia -  On Aricept.   5. HTN - BP stable

## 2016-06-18 ENCOUNTER — Other Ambulatory Visit: Payer: Self-pay | Admitting: Cardiology

## 2016-06-18 NOTE — Telephone Encounter (Signed)
Rx(s) sent to pharmacy electronically.  

## 2016-10-09 ENCOUNTER — Other Ambulatory Visit: Payer: Self-pay | Admitting: Cardiology

## 2016-10-10 NOTE — Telephone Encounter (Signed)
REFILL 

## 2017-01-10 ENCOUNTER — Other Ambulatory Visit: Payer: Self-pay | Admitting: Cardiology

## 2017-02-27 ENCOUNTER — Encounter: Payer: Self-pay | Admitting: Cardiology

## 2017-03-05 NOTE — Progress Notes (Deleted)
Troy Gregory Date of Birth: 06-Jan-1936 Medical Record #[id number]  History of Present Illness: Troy Gregory is seen for follow up of CAD. He has a history of HTN, atrial fib with prior MAZE (January 2013), BPH with prior TURP, chronic anticoagulation, CAD with  CABG  which include LIMA to LAD, SVG to intermediate, and SVG to distal RCA with his MAZE and MV repair in January of 2013, CKD, and past syncope related to heat exposure. Prior echo from January of 2013 - had mild to moderate LV dysfunction this improved on echo from August of 2014. He was admitted in March 2016 with a syncopal episode after having a cyst on his neck lanced. Work up was negative. No recurrence.   He is doing very well from a cardiac standpoint. No  chest pain. No palpitations, dyspnea, or dizziness. Still dealing with memory loss and is on Aricept. Main activity is walking out to the barn to check on his horses.   Current Outpatient Prescriptions  Medication Sig Dispense Refill  . amLODipine (NORVASC) 5 MG tablet Take 5 mg by mouth daily.     Marland Kitchen atorvastatin (LIPITOR) 40 MG tablet TAKE 1 TABLET BY MOUTH DAILY AFTER BREAKFAST 30 tablet 6  . carvedilol (COREG) 3.125 MG tablet TAKE 1 TABLET BY MOUTH TWICE DAILY WITH MEALS 60 tablet 4  . donepezil (ARICEPT) 10 MG tablet Take 10 mg by mouth at bedtime.    . finasteride (PROSCAR) 5 MG tablet Take 5 mg by mouth daily.     Marland Kitchen lisinopril (PRINIVIL,ZESTRIL) 20 MG tablet Take 20 mg by mouth daily.     . nitrofurantoin, macrocrystal-monohydrate, (MACROBID) 100 MG capsule Take 1 capsule (100 mg total) by mouth 2 (two) times daily. 10 capsule 0  . potassium chloride (MICRO-K) 10 MEQ CR capsule Take 10 mEq by mouth 2 (two) times daily.    . potassium chloride SA (K-DUR,KLOR-CON) 20 MEQ tablet TAKE 1/2 TABLET BY MOUTH TWICE DAILY 30 tablet 2  . potassium chloride SA (K-DUR,KLOR-CON) 20 MEQ tablet TAKE 1/2 TABLET BY MOUTH TWICE DAILY 30 tablet 4  . vitamin B-12 (CYANOCOBALAMIN) 1000 MCG  tablet Take 1,000 mcg by mouth daily.    Marland Kitchen warfarin (COUMADIN) 3 MG tablet Take as directed by coumadin clinic (Patient taking differently: Take 3-6 mg by mouth daily.  daily except for  on Sundays) 40 tablet 3   No current facility-administered medications for this visit.     No Known Allergies  Past Medical History:  Diagnosis Date  . Arthritis    "a little in my joints" (08/11/2014)  . Atrial fibrillation (HCC) 06/2011   S/p left sided MAZE. On Coumadin, January, 2013  . Benign prostatic hypertrophy    but stable  . Calculus of kidney   . Chronic anticoagulation   . Chronic kidney disease 12/2010   with a creatinine on discharge of 1.84, was  2.1 on admission  . Chronic kidney disease (CKD), stage III (moderate)    Troy Gregory 08/11/2014  . Coronary artery disease 06/2011   S/p LIMA-LAD, SVG-intermediate coronary (?), and SVG-distal RCA with concurrent MV repair and MAZE  . Ejection fraction < 50%    EF previously 50%, however 20-25% by echo and catheterization before CABG  . H/O hiatal hernia   . High cholesterol   . Hypertension   . Mitral regurgitation 06/2011   Mitral valve ring annuloplasty at the time of CABG,, January, 2013  . Myocardial infarction (HCC)    "I believe I've  had a heart attack; don't remember when" (3/92016)  . Shortness of breath   . Syncope    Near-syncope, etiology is deemed secondary to the excessive heat    Past Surgical History:  Procedure Laterality Date  . CIRCUMCISION  08/12/2002   because of phimosis  . CORONARY ARTERY BYPASS GRAFT  06/06/2011    grafts times three using left internal mammary artery and right leg greater saphenous vein harvested endoscopically  . CYSTOSCOPY  02/13/2012   Procedure: CYSTOSCOPY;  Surgeon: Milford Cage, MD;  Location: [facility] ORS;  Service: Urology;  Laterality: N/A;  . LITHOTRIPSY    . MAZE  06/06/2011   Procedure: MAZE;  Surgeon: [name], MD;  Location: [facility] OR;  Service: Open Heart Surgery;   Laterality: N/A;  . MITRAL VALVE REPAIR  06/06/2011   Procedure: MITRAL VALVE REPAIR (MVR);  Surgeon: [name], MD;  Location: [facility] OR;  Service: Open Heart Surgery;  Laterality: N/A;  . SATURATION BIOPSY OF PROSTATE    . TONSILLECTOMY    . TRANSURETHRAL RESECTION OF PROSTATE  02/13/2012   Procedure: TRANSURETHRAL RESECTION OF THE PROSTATE WITH GYRUS INSTRUMENTS;  Surgeon: Milford Cage, MD;  Location: [facility] ORS;  Service: Urology;  Laterality: N/A;  . TRANSURETHRAL RESECTION OF PROSTATE     hx/notes 08/11/2014    History  Smoking Status  . Former Smoker  . Packs/day: 1.00  . Years: 45.00  . Types: Cigarettes, Cigars  . Quit date: 07/01/1964  Smokeless Tobacco  . Former Neurosurgeon  . Types: Chew    History  Alcohol Use  . Yes    Comment: 08/11/2014 "a couple shots maybe once/year now"    Family History  Problem Relation Age of Onset  . Cancer Father   . Lung cancer Father   . Diabetes Father   . Hypertension Mother   . Hypertension Brother     Review of Systems: The review of systems is per the HPI.  All other systems were reviewed and are negative.  Physical Exam: There were no vitals taken for this visit. Patient is very pleasant and in no acute distress. Skin is warm and dry. Color is normal.  HEENT is unremarkable. Normocephalic/atraumatic. PERRL. Sclera are nonicteric. Neck is supple. No masses. No JVD. Lungs are clear. Cardiac exam shows a regular rate and rhythm. Normal S1-2. No gallop or murmur. Abdomen is soft. Extremities are without edema. Gait and ROM are intact. No gross neurologic deficits noted.   LABORATORY DATA:  Lab Results  Component Value Date   WBC 8.3 08/12/2014   HGB 14.5 08/12/2014   HCT 43.2 08/12/2014   PLT 190 08/12/2014   GLUCOSE 126 (H) 08/12/2014   CHOL 142 09/21/2011   TRIG 199.0 (H) 09/21/2011   HDL 32.50 (L) 09/21/2011   LDLCALC 70 09/21/2011   ALT 17 08/12/2014   AST 23 08/12/2014   NA 139 08/12/2014   K 3.8 08/12/2014    CL 107 08/12/2014   CREATININE 1.81 (H) 08/12/2014   BUN 17 08/12/2014   CO2 27 08/12/2014   TSH 1.335 08/11/2014   PSA 3.77 06/04/2011   INR 2.76 (H) 08/12/2014   HGBA1C 5.5 09/24/2011   Dated 01/29/17: cholesterol 237, triglycerides 631, HDL 33, LDL 73. A1c 6%. Creatinine 1.98. Chemistries otherwise normal. CBC normal.  Laboratory Data:  Echo Study Conclusions from August 2014  - Left ventricle: The cavity size was normal. Wall thickness was increased in a pattern of mild LVH. Systolic function was  normal. The estimated ejection fraction was in the range of 55% to 60%. Wall motion was normal; there were no regional wall motion abnormalities. Features are consistent with a pseudonormal left ventricular filling pattern, with concomitant abnormal relaxation and increased filling pressure (grade 2 diastolic dysfunction). - Aortic valve: Mild regurgitation. - Mitral valve: Prior procedures included surgical repair. - Left atrium: The atrium was mildly to moderately dilated.  Ecg today: NSR, LAFB, poor R wave progression in the precordial leads with old anterior MI. Low voltage. I have personally reviewed and interpreted this study.   Assessment / Plan:  1. CAD - past CABG - doing well without angina. Follow up in one year. Continue current medications.    2. MV repair - follow up echo last August showed resolution of his LV dysfunction.   3. Atrial fib - was in sinus on last EKG and on exam today. On long term coumadin.   4. Dementia -  On Aricept.   5. HTN - BP stable

## 2017-03-07 ENCOUNTER — Ambulatory Visit: Payer: Medicare Other | Admitting: Cardiology

## 2017-03-12 ENCOUNTER — Emergency Department (HOSPITAL_COMMUNITY): Payer: Medicare Other

## 2017-03-12 ENCOUNTER — Encounter (HOSPITAL_COMMUNITY): Payer: Self-pay | Admitting: *Deleted

## 2017-03-12 ENCOUNTER — Emergency Department (HOSPITAL_COMMUNITY)
Admission: EM | Admit: 2017-03-12 | Discharge: 2017-03-12 | Disposition: A | Discharge status: Home / Self Care | Payer: Medicare Other | Attending: Emergency Medicine | Admitting: Emergency Medicine

## 2017-03-12 DIAGNOSIS — I129 Hypertensive chronic kidney disease with stage 1 through stage 4 chronic kidney disease, or unspecified chronic kidney disease: Secondary | ICD-10-CM | POA: Diagnosis not present

## 2017-03-12 DIAGNOSIS — Z87891 Personal history of nicotine dependence: Secondary | ICD-10-CM | POA: Insufficient documentation

## 2017-03-12 DIAGNOSIS — Z79899 Other long term (current) drug therapy: Secondary | ICD-10-CM | POA: Insufficient documentation

## 2017-03-12 DIAGNOSIS — R4182 Altered mental status, unspecified: Secondary | ICD-10-CM | POA: Diagnosis present

## 2017-03-12 DIAGNOSIS — Z951 Presence of aortocoronary bypass graft: Secondary | ICD-10-CM | POA: Insufficient documentation

## 2017-03-12 DIAGNOSIS — Z7901 Long term (current) use of anticoagulants: Secondary | ICD-10-CM | POA: Diagnosis not present

## 2017-03-12 DIAGNOSIS — F0391 Unspecified dementia with behavioral disturbance: Secondary | ICD-10-CM | POA: Diagnosis not present

## 2017-03-12 DIAGNOSIS — E860 Dehydration: Secondary | ICD-10-CM | POA: Insufficient documentation

## 2017-03-12 DIAGNOSIS — I259 Chronic ischemic heart disease, unspecified: Secondary | ICD-10-CM | POA: Diagnosis not present

## 2017-03-12 DIAGNOSIS — N183 Chronic kidney disease, stage 3 (moderate): Secondary | ICD-10-CM | POA: Diagnosis not present

## 2017-03-12 HISTORY — DX: Unspecified dementia, unspecified severity, without behavioral disturbance, psychotic disturbance, mood disturbance, and anxiety: F03.90

## 2017-03-12 LAB — CBC WITH DIFFERENTIAL/PLATELET
BASOS PCT: 0 %
Basophils Absolute: 0 10*3/uL (ref 0.0–0.1)
EOS ABS: 0.1 10*3/uL (ref 0.0–0.7)
EOS PCT: 1 %
HCT: 44.6 % (ref 39.0–52.0)
HEMOGLOBIN: 14.9 g/dL (ref 13.0–17.0)
Lymphocytes Relative: 9 %
Lymphs Abs: 0.8 10*3/uL (ref 0.7–4.0)
MCH: 31.5 pg (ref 26.0–34.0)
MCHC: 33.4 g/dL (ref 30.0–36.0)
MCV: 94.3 fL (ref 78.0–100.0)
Monocytes Absolute: 1.1 10*3/uL — ABNORMAL HIGH (ref 0.1–1.0)
Monocytes Relative: 11 %
NEUTROS PCT: 79 %
Neutro Abs: 7.8 10*3/uL — ABNORMAL HIGH (ref 1.7–7.7)
Platelets: 150 10*3/uL (ref 150–400)
RBC: 4.73 MIL/uL (ref 4.22–5.81)
RDW: 13.4 % (ref 11.5–15.5)
WBC: 9.8 10*3/uL (ref 4.0–10.5)

## 2017-03-12 LAB — COMPREHENSIVE METABOLIC PANEL
ALK PHOS: 67 U/L (ref 38–126)
ALT: 31 U/L (ref 17–63)
AST: 40 U/L (ref 15–41)
Albumin: 3.4 g/dL — ABNORMAL LOW (ref 3.5–5.0)
Anion gap: 12 (ref 5–15)
BILIRUBIN TOTAL: 0.6 mg/dL (ref 0.3–1.2)
BUN: 25 mg/dL — ABNORMAL HIGH (ref 6–20)
CALCIUM: 9.1 mg/dL (ref 8.9–10.3)
CO2: 25 mmol/L (ref 22–32)
CREATININE: 2.06 mg/dL — AB (ref 0.61–1.24)
Chloride: 104 mmol/L (ref 101–111)
GFR, EST AFRICAN AMERICAN: 34 mL/min — AB (ref >60)
GFR, EST NON AFRICAN AMERICAN: 29 mL/min — AB (ref >60)
Glucose, Bld: 142 mg/dL — ABNORMAL HIGH (ref 65–99)
Potassium: 3.7 mmol/L (ref 3.5–5.1)
Sodium: 141 mmol/L (ref 135–145)
Total Protein: 6.5 g/dL (ref 6.5–8.1)

## 2017-03-12 LAB — URINALYSIS, ROUTINE W REFLEX MICROSCOPIC
BILIRUBIN URINE: NEGATIVE
Glucose, UA: NEGATIVE mg/dL
Ketones, ur: NEGATIVE mg/dL
LEUKOCYTES UA: NEGATIVE
Nitrite: NEGATIVE
PROTEIN: 100 mg/dL — AB
Specific Gravity, Urine: 1.017 (ref 1.005–1.030)
pH: 6 (ref 5.0–8.0)

## 2017-03-12 LAB — PROTIME-INR
INR: 3.59
PROTHROMBIN TIME: 35.6 s — AB (ref 11.4–15.2)

## 2017-03-12 MED ORDER — SODIUM CHLORIDE 0.9 % IV BOLUS (SEPSIS)
500.0000 mL | Freq: Once | INTRAVENOUS | Status: AC
  Administered 2017-03-12: 500 mL via INTRAVENOUS

## 2017-03-12 NOTE — ED Notes (Signed)
Care Management at the bedside.

## 2017-03-12 NOTE — ED Provider Notes (Signed)
[facility]-EMERGENCY DEPT Provider Note   CSN: [id number] Arrival date & time: 2017-04-03  1222     History   Chief Complaint Chief Complaint  Patient presents with  . Altered Mental Status    HPI ANTIONO Gregory is a 81 y.o. male.  Troy Gregory is a 81 y.o. Male who presents to the ED via EMS after being found walking away from his home. Patient reports he was being "dumb" and went on a walk. He reports he is not sure why he went on a walker where he was going. He was found to mouth on a half from his home. He lives at home with his wife who works occasionally. She reports the patient was diagnosed with dementia about 6 years ago and has been gradually becoming worse. She reports she has difficulty getting him today and do his activities of daily living. Wife was at work when he went on the walk. He was found by EMS was 180 and had initial low blood pressure. This improved after rest and fluids. Wife also reports the patient had some diarrhea over the weekend and this resolved 2 days ago. She also reports 2 days ago he fell and hit his head. He is still on Coumadin. She reports other than his dementia he has been acting normally. No seizure like activity. No focal weakness. No fevers, vomiting, current diarrhea, headache, abdominal pain, urinary symptoms, chest pain, coughing, shortness of breath, changes to his vision, numbness or weakness.   The history is provided by the patient, the spouse, medical records and the EMS personnel. No language interpreter was used.  Altered Mental Status   Associated symptoms include confusion. Pertinent negatives include no weakness.    Past Medical History:  Diagnosis Date  . Arthritis    "a little in my joints" (08/11/2014)  . Atrial fibrillation (HCC) 06/2011   S/p left sided MAZE. On Coumadin, January, 2013  . Benign prostatic hypertrophy    but stable  . Calculus of kidney   . Chronic anticoagulation   . Chronic kidney disease 12/2010   with a  creatinine on discharge of 1.84, was  2.1 on admission  . Chronic kidney disease (CKD), stage III (moderate) (HCC)    Troy Gregory 08/11/2014  . Coronary artery disease 06/2011   S/p LIMA-LAD, SVG-intermediate coronary (?), and SVG-distal RCA with concurrent MV repair and MAZE  . Dementia   . Ejection fraction < 50%    EF previously 50%, however 20-25% by echo and catheterization before CABG  . H/O hiatal hernia   . High cholesterol   . Hypertension   . Mitral regurgitation 06/2011   Mitral valve ring annuloplasty at the time of CABG,, January, 2013  . Myocardial infarction [facility])    "I believe I've had a heart attack; don't remember when" (3/92016)  . Shortness of breath   . Syncope    Near-syncope, etiology is deemed secondary to the excessive heat    Patient Active Problem List   Diagnosis Date Noted  . Syncope and collapse 08/11/2014  . CKD (chronic kidney disease) stage 3, GFR 30-59 ml/min (HCC) 08/11/2014  . 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 Leukocytosis 08/11/2014  . PVC's (premature ventricular contractions)   . Memory loss 09/19/2011  . UTI (lower urinary tract infection) 07/01/2011  . H/O mitral valve repair-2013 06/05/2011  . Hx of CABG 2013 06/05/2011  . PAF (paroxysmal atrial fibrillation) (HCC)   . Hypertension-hypotensive on admission  12/03/2010    Past Surgical History:  Procedure Laterality Date  . CIRCUMCISION  08/12/2002   because of phimosis  . CORONARY ARTERY BYPASS GRAFT  06/06/2011    grafts times three using left internal mammary artery and right leg greater saphenous vein harvested endoscopically  . CYSTOSCOPY  02/13/2012   Procedure: CYSTOSCOPY;  Surgeon: Milford Cage, MD;  Location: [facility] ORS;  Service: Urology;  Laterality: N/A;  . LITHOTRIPSY    . MAZE  06/06/2011   Procedure: MAZE;  Surgeon: [name], MD;  Location: [facility] OR;  Service: Open Heart Surgery;  Laterality: N/A;  . MITRAL VALVE REPAIR  06/06/2011     Procedure: MITRAL VALVE REPAIR (MVR);  Surgeon: [name], MD;  Location: [facility] OR;  Service: Open Heart Surgery;  Laterality: N/A;  . SATURATION BIOPSY OF PROSTATE    . TONSILLECTOMY    . TRANSURETHRAL RESECTION OF PROSTATE  02/13/2012   Procedure: TRANSURETHRAL RESECTION OF THE PROSTATE WITH GYRUS INSTRUMENTS;  Surgeon: Milford Cage, MD;  Location: [facility] ORS;  Service: Urology;  Laterality: N/A;  . TRANSURETHRAL RESECTION OF PROSTATE     hx/notes 08/11/2014       Home Medications    Prior to Admission medications   Medication Sig Start Date End Date Taking? Authorizing Provider  amLODipine (NORVASC) 5 MG tablet Take 5 mg by mouth daily.  08/13/13   [provider]  atorvastatin (LIPITOR) 40 MG tablet TAKE 1 TABLET BY MOUTH DAILY AFTER BREAKFAST 03/25/15   [place], Peter M, MD  carvedilol (COREG) 3.125 MG tablet TAKE 1 TABLET BY MOUTH TWICE DAILY WITH MEALS 01/10/17   [place], Peter M, MD  donepezil (ARICEPT) 10 MG tablet Take 10 mg by mouth at bedtime.    [provider]  finasteride (PROSCAR) 5 MG tablet Take 5 mg by mouth daily.  08/07/13   [provider]  lisinopril (PRINIVIL,ZESTRIL) 20 MG tablet Take 20 mg by mouth daily.  02/25/12   Marden Noble, MD  nitrofurantoin, macrocrystal-monohydrate, (MACROBID) 100 MG capsule Take 1 capsule (100 mg total) by mouth 2 (two) times daily. 08/12/14   Alison Murray, MD  potassium chloride (MICRO-K) 10 MEQ CR capsule Take 10 mEq by mouth 2 (two) times daily.    [provider]  potassium chloride SA (K-DUR,KLOR-CON) 20 MEQ tablet TAKE 1/2 TABLET BY MOUTH TWICE DAILY 08/05/15   [place], Peter M, MD  potassium chloride SA (K-DUR,KLOR-CON) 20 MEQ tablet TAKE 1/2 TABLET BY MOUTH TWICE DAILY 06/18/16   [place], Peter M, MD  vitamin B-12 (CYANOCOBALAMIN) 1000 MCG tablet Take 1,000 mcg by mouth daily.    [provider]  warfarin (COUMADIN) 3 MG tablet Take as directed by coumadin clinic Patient taking  differently: Take 3-6 mg by mouth daily.  daily except for  on Sundays 04/21/12   [place], Peter M, MD    Family History Family History  Problem Relation Age of Onset  . Cancer Father   . Lung cancer Father   . Diabetes Father   . Hypertension Mother   . Hypertension Brother     Social History Social History  Substance Use Topics  . Smoking status: Former Smoker    Packs/day: 1.00    Years: 45.00    Types: Cigarettes, Cigars    Quit date: 07/01/1964  . Smokeless tobacco: Former Neurosurgeon    Types: Chew  . Alcohol use Yes     Comment: 08/11/2014 "a couple shots maybe once/year now"  Allergies   Patient has no known allergies.   Review of Systems Review of Systems  Constitutional: Negative for chills and fever.  HENT: Negative for congestion and sore throat.   Eyes: Negative for visual disturbance.  Respiratory: Negative for cough and shortness of breath.   Cardiovascular: Negative for chest pain.  Gastrointestinal: Negative for abdominal pain, diarrhea (resolved ), nausea and vomiting.  Genitourinary: Negative for dysuria.  Musculoskeletal: Negative for back pain and neck pain.  Skin: Negative for rash.  Neurological: Negative for dizziness, weakness, light-headedness, numbness and headaches.  Psychiatric/Behavioral: Positive for confusion.     Physical Exam Updated Vital Signs BP (!) 136/91   Pulse 73   Temp 98 F (36.7 C) (Oral)   Resp 19   SpO2 96%   Physical Exam  Constitutional: He appears well-developed and well-nourished. No distress.  Nontoxic appearing. Slightly disheveled.  HENT:  Head: Normocephalic and atraumatic.  Right Ear: External ear normal.  Left Ear: External ear normal.  Mouth/Throat: Oropharynx is clear and moist.  Eyes: Pupils are equal, round, and reactive to light. Conjunctivae and EOM are normal. Right eye exhibits no discharge. Left eye exhibits no discharge.  Neck: Normal range of motion. Neck supple. No JVD present.    Cardiovascular: Normal rate, regular rhythm, normal heart sounds and intact distal pulses.  Exam reveals no gallop and no friction rub.   No murmur heard. Pulmonary/Chest: Effort normal and breath sounds normal. No stridor. No respiratory distress. He has no wheezes. He has no rales.  Lungs are clear to ascultation bilaterally. Symmetric chest expansion bilaterally. No increased work of breathing. No rales or rhonchi.    Abdominal: Soft. There is no tenderness. There is no guarding.  Musculoskeletal: Normal range of motion. He exhibits no edema, tenderness or deformity.  Patient is spontaneously moving all extremities in a coordinated fashion exhibiting good strength.   Lymphadenopathy:    He has no cervical adenopathy.  Neurological: He is alert. No cranial nerve deficit or sensory deficit. He exhibits normal muscle tone. Coordination normal.  Patient is alert and oriented to person and place only. Cranial nerves are intact. Speech is clear and coherent. No pronator drift. Sensation and strength is intact his bilateral upper and lower extremities.  Skin: Skin is warm and dry. No rash noted. He is not diaphoretic. No erythema. No pallor.  Psychiatric: He has a normal mood and affect. His behavior is normal.  Nursing note and vitals reviewed.    ED Treatments / Results  Labs (all labs ordered are listed, but only abnormal results are displayed) Labs Reviewed  COMPREHENSIVE METABOLIC PANEL - Abnormal; Notable for the following:       Result Value   Glucose, Bld 142 (*)    BUN 25 (*)    Creatinine, Ser 2.06 (*)    Albumin 3.4 (*)    GFR calc non Af Amer 29 (*)    GFR calc Af Amer 34 (*)    All other components within normal limits  CBC WITH DIFFERENTIAL/PLATELET - Abnormal; Notable for the following:    Neutro Abs 7.8 (*)    Monocytes Absolute 1.1 (*)    All other components within normal limits  PROTIME-INR - Abnormal; Notable for the following:    Prothrombin Time 35.6 (*)     All other components within normal limits  URINALYSIS, ROUTINE W REFLEX MICROSCOPIC - Abnormal; Notable for the following:    Hgb urine dipstick MODERATE (*)    Protein, ur 100 (*)  Bacteria, UA RARE (*)    Squamous Epithelial / LPF 0-5 (*)    All other components within normal limits    EKG  EKG Interpretation  Date/Time:  Tuesday March 22, 2017 13:30:07 EDT Ventricular Rate:  76 PR Interval:    QRS Duration: 87 QT Interval:  491 QTC Calculation: 553 R Axis:   -21 Text Interpretation:  Sinus rhythm Ventricular premature complex Inferior infarct, old Prolonged QT interval Confirmed by Tilden Fossa [phone]) on 03-22-17 1:42:09 PM       Radiology Ct Head Wo Contrast  Result Date: March 22, 2017 CLINICAL DATA:  Dementia.  Found wandering. EXAM: CT HEAD WITHOUT CONTRAST TECHNIQUE: Contiguous axial images were obtained from the base of the skull through the vertex without intravenous contrast. COMPARISON:  02/23/2017 FINDINGS: Brain: Generalized atrophy. Chronic small-vessel ischemic changes of the hemispheric white matter. No sign of acute infarction, mass lesion, hemorrhage, hydrocephalus or extra-axial collection. Vascular: There is atherosclerotic calcification of the major vessels at the base of the brain. Skull: Normal Sinuses/Orbits: Paranasal sinusitis on the left affecting the frontal, ethmoid and maxillary sinuses. Cannot rule out nasal mass on the left. Other: None IMPRESSION: No acute intracranial finding. Atrophy and chronic small-vessel ischemic changes. Paranasal sinusitis affecting the left maxillary, ethmoid and frontal sinuses. Cannot rule out nasal mass on the left. Electronically Signed   By: Paulina Fusi M.D.   On: 2017/03/22 13:55    Procedures Procedures (including critical care time)  Medications Ordered in ED Medications  sodium chloride 0.9 % bolus 500 mL (0 mLs Intravenous Stopped 03/22/17 1444)     Initial Impression / Assessment and Plan / ED Course  I  have reviewed the triage vital signs and the nursing notes.  Pertinent labs & imaging results that were available during my care of the patient were reviewed by me and considered in my medical decision making (see chart for details).    This  is a 81 y.o. Male who presents to the ED via EMS after being found walking away from his home. Patient reports he was being "dumb" and went on a walk. He reports he is not sure why he went on a walker where he was going. He was found to mouth on a half from his home. He lives at home with his wife who works occasionally. She reports the patient was diagnosed with dementia about 6 years ago and has been gradually becoming worse. She reports she has difficulty getting him today and do his activities of daily living. Wife was at work when he went on the walk. He was found by EMS was 180 and had initial low blood pressure. This improved after rest and fluids. Wife also reports the patient had some diarrhea over the weekend and this resolved 2 days ago. She also reports 2 days ago he fell and hit his head. He is still on Coumadin. She reports other than his dementia he has been acting normally.  On examination patient is afebrile nontoxic appearing. He is slightly disheveled. He has no focal neurological deficits on exam. He is alert and oriented to person and place only. Urinalysis shows no evidence of infection. CMP shows a mildly elevated creatinine and BUN above his baseline. Consistent with some dehydration. CBC is unremarkable. INR is mildly elevated 3.49. Advised to hold Coumadin for one day. CT head shows no acute findings. I did discuss case with case management. We will have PT and home health and to the home to help with patient's  ADLs. Wife is agreeable and very happy about this. We'll discharge back home with wife. I encouraged follow-up by primary care and discussed return precautions. I advised return to the emergency department new or worsening symptoms or new  concerns. The patient's wife verbalized understanding and agreement with plan.  This patient was discussed with and evaluated by Dr. Madilyn Hook who agrees with assessment and plan.   Final Clinical Impressions(s) / ED Diagnoses   Final diagnoses:  Dementia with behavioral disturbance, unspecified dementia type  Anticoagulated on Coumadin  Dehydration    New Prescriptions New Prescriptions   No medications on file     Everlene Farrier, Cordelia Poche 03/27/2017 1702    Tilden Fossa, MD 03/13/17 [phone]

## 2017-03-12 NOTE — ED Triage Notes (Signed)
To ED via [facility] for eval after being found 1.5 miles from home walking. This is out of character for pt. He has documented dementia. Lives at home with his wife. Pt is alert on arrival to the ED and does remember walking. States "I don't know why I was walking. That was a dumb thing to do". Skin w/d, resp e/u. Pt answers all orientation questions appropriately. No family accompanies pt now but where on scene

## 2017-04-04 NOTE — Death Summary Note (Signed)
Donnalee Cellucci J. Jerimey Burridge, RN, BSN, NCM [phone] Spoke with pt at bedside regarding discharge planning for Home Health Services. Offered pt list of home health agencies to choose from.  Pt chose [facility] to render services. Jermaine of [facility] notified. Patient made aware that [facility] will be in contact in 24-48 hours.  No DME needs identified at this time.   

## 2017-04-04 DEATH — deceased

## 2017-08-08 ENCOUNTER — Other Ambulatory Visit: Payer: Self-pay | Admitting: Cardiology

## 2017-08-08 NOTE — Telephone Encounter (Signed)
REFILL 

## 2018-02-19 ENCOUNTER — Other Ambulatory Visit: Payer: Self-pay | Admitting: Cardiology

## 2018-03-27 ENCOUNTER — Other Ambulatory Visit: Payer: Self-pay | Admitting: Cardiology

## 2018-12-16 ENCOUNTER — Telehealth: Payer: Self-pay | Admitting: Cardiology

## 2018-12-16 NOTE — Telephone Encounter (Signed)
lm on son's phone about recall- patient's lines are disconnected

## 2019-03-13 ENCOUNTER — Encounter (HOSPITAL_COMMUNITY): Payer: Self-pay | Admitting: Emergency Medicine

## 2019-03-13 ENCOUNTER — Other Ambulatory Visit: Payer: Self-pay

## 2019-03-13 ENCOUNTER — Emergency Department (HOSPITAL_COMMUNITY): Payer: Medicare Other

## 2019-03-13 ENCOUNTER — Inpatient Hospital Stay (HOSPITAL_COMMUNITY)
Admission: EM | Admit: 2019-03-13 | Discharge: 2019-03-19 | DRG: 689 | Disposition: A | Discharge status: Hospice | Payer: Medicare Other | Attending: Internal Medicine | Admitting: Internal Medicine

## 2019-03-13 DIAGNOSIS — R4182 Altered mental status, unspecified: Secondary | ICD-10-CM | POA: Diagnosis present

## 2019-03-13 DIAGNOSIS — Z6822 Body mass index (BMI) 22.0-22.9, adult: Secondary | ICD-10-CM

## 2019-03-13 DIAGNOSIS — N183 Chronic kidney disease, stage 3 unspecified: Secondary | ICD-10-CM | POA: Diagnosis present

## 2019-03-13 DIAGNOSIS — I480 Paroxysmal atrial fibrillation: Secondary | ICD-10-CM | POA: Diagnosis present

## 2019-03-13 DIAGNOSIS — E860 Dehydration: Secondary | ICD-10-CM | POA: Diagnosis present

## 2019-03-13 DIAGNOSIS — I130 Hypertensive heart and chronic kidney disease with heart failure and stage 1 through stage 4 chronic kidney disease, or unspecified chronic kidney disease: Secondary | ICD-10-CM | POA: Diagnosis present

## 2019-03-13 DIAGNOSIS — I2510 Atherosclerotic heart disease of native coronary artery without angina pectoris: Secondary | ICD-10-CM | POA: Diagnosis present

## 2019-03-13 DIAGNOSIS — Z87891 Personal history of nicotine dependence: Secondary | ICD-10-CM

## 2019-03-13 DIAGNOSIS — L89152 Pressure ulcer of sacral region, stage 2: Secondary | ICD-10-CM | POA: Diagnosis present

## 2019-03-13 DIAGNOSIS — R413 Other amnesia: Secondary | ICD-10-CM | POA: Diagnosis present

## 2019-03-13 DIAGNOSIS — Z951 Presence of aortocoronary bypass graft: Secondary | ICD-10-CM

## 2019-03-13 DIAGNOSIS — J341 Cyst and mucocele of nose and nasal sinus: Secondary | ICD-10-CM | POA: Diagnosis present

## 2019-03-13 DIAGNOSIS — Z79899 Other long term (current) drug therapy: Secondary | ICD-10-CM

## 2019-03-13 DIAGNOSIS — Z515 Encounter for palliative care: Secondary | ICD-10-CM

## 2019-03-13 DIAGNOSIS — Z7901 Long term (current) use of anticoagulants: Secondary | ICD-10-CM

## 2019-03-13 DIAGNOSIS — E876 Hypokalemia: Secondary | ICD-10-CM | POA: Diagnosis present

## 2019-03-13 DIAGNOSIS — Z8249 Family history of ischemic heart disease and other diseases of the circulatory system: Secondary | ICD-10-CM

## 2019-03-13 DIAGNOSIS — Z801 Family history of malignant neoplasm of trachea, bronchus and lung: Secondary | ICD-10-CM

## 2019-03-13 DIAGNOSIS — B9620 Unspecified Escherichia coli [E. coli] as the cause of diseases classified elsewhere: Secondary | ICD-10-CM | POA: Diagnosis present

## 2019-03-13 DIAGNOSIS — I5042 Chronic combined systolic (congestive) and diastolic (congestive) heart failure: Secondary | ICD-10-CM | POA: Diagnosis present

## 2019-03-13 DIAGNOSIS — G9341 Metabolic encephalopathy: Secondary | ICD-10-CM | POA: Diagnosis present

## 2019-03-13 DIAGNOSIS — Z66 Do not resuscitate: Secondary | ICD-10-CM

## 2019-03-13 DIAGNOSIS — R627 Adult failure to thrive: Secondary | ICD-10-CM

## 2019-03-13 DIAGNOSIS — J328 Other chronic sinusitis: Secondary | ICD-10-CM | POA: Diagnosis present

## 2019-03-13 DIAGNOSIS — R531 Weakness: Secondary | ICD-10-CM | POA: Diagnosis not present

## 2019-03-13 DIAGNOSIS — F329 Major depressive disorder, single episode, unspecified: Secondary | ICD-10-CM | POA: Diagnosis present

## 2019-03-13 DIAGNOSIS — F0390 Unspecified dementia without behavioral disturbance: Secondary | ICD-10-CM | POA: Diagnosis present

## 2019-03-13 DIAGNOSIS — E7800 Pure hypercholesterolemia, unspecified: Secondary | ICD-10-CM | POA: Diagnosis present

## 2019-03-13 DIAGNOSIS — N400 Enlarged prostate without lower urinary tract symptoms: Secondary | ICD-10-CM | POA: Diagnosis present

## 2019-03-13 DIAGNOSIS — Z833 Family history of diabetes mellitus: Secondary | ICD-10-CM

## 2019-03-13 DIAGNOSIS — N390 Urinary tract infection, site not specified: Secondary | ICD-10-CM | POA: Diagnosis not present

## 2019-03-13 DIAGNOSIS — R451 Restlessness and agitation: Secondary | ICD-10-CM

## 2019-03-13 DIAGNOSIS — Z20828 Contact with and (suspected) exposure to other viral communicable diseases: Secondary | ICD-10-CM | POA: Diagnosis present

## 2019-03-13 DIAGNOSIS — L8990 Pressure ulcer of unspecified site, unspecified stage: Secondary | ICD-10-CM | POA: Insufficient documentation

## 2019-03-13 LAB — CBC WITH DIFFERENTIAL/PLATELET
Abs Immature Granulocytes: 0.22 10*3/uL — ABNORMAL HIGH (ref 0.00–0.07)
Basophils Absolute: 0.1 10*3/uL (ref 0.0–0.1)
Basophils Relative: 0 %
Eosinophils Absolute: 0 10*3/uL (ref 0.0–0.5)
Eosinophils Relative: 0 %
HCT: 40 % (ref 39.0–52.0)
Hemoglobin: 12.8 g/dL — ABNORMAL LOW (ref 13.0–17.0)
Immature Granulocytes: 1 %
Lymphocytes Relative: 3 %
Lymphs Abs: 0.7 10*3/uL (ref 0.7–4.0)
MCH: 30.2 pg (ref 26.0–34.0)
MCHC: 32 g/dL (ref 30.0–36.0)
MCV: 94.3 fL (ref 80.0–100.0)
Monocytes Absolute: 1.5 10*3/uL — ABNORMAL HIGH (ref 0.1–1.0)
Monocytes Relative: 8 %
Neutro Abs: 16.7 10*3/uL — ABNORMAL HIGH (ref 1.7–7.7)
Neutrophils Relative %: 88 %
Platelets: 167 10*3/uL (ref 150–400)
RBC: 4.24 MIL/uL (ref 4.22–5.81)
RDW: 13.1 % (ref 11.5–15.5)
WBC: 19.1 10*3/uL — ABNORMAL HIGH (ref 4.0–10.5)
nRBC: 0 % (ref 0.0–0.2)

## 2019-03-13 LAB — CBG MONITORING, ED: Glucose-Capillary: 152 mg/dL — ABNORMAL HIGH (ref 70–99)

## 2019-03-13 MED ORDER — LORAZEPAM 2 MG/ML IJ SOLN
0.5000 mg | INTRAMUSCULAR | Status: DC | PRN
  Administered 2019-03-13: 0.5 mg via INTRAVENOUS
  Filled 2019-03-13: qty 1

## 2019-03-13 MED ORDER — SODIUM CHLORIDE 0.9 % IV BOLUS
500.0000 mL | Freq: Once | INTRAVENOUS | Status: AC
  Administered 2019-03-13: 500 mL via INTRAVENOUS

## 2019-03-13 NOTE — ED Notes (Signed)
Patient transported to CT 

## 2019-03-13 NOTE — ED Notes (Signed)
CMP to be recollected.

## 2019-03-13 NOTE — ED Provider Notes (Signed)
Assumed care from PA [facility] at shift change.  See prior notes for full H&P.  Briefly, 83 y.o. M here with AMS.  He does have history of dementia. Leukocytosis of 19K.  CMP got lost in lab.  CT head with thickening and concerning for sinusitis vs mucocele.  CXR clear.  Mild agitation here, given some ativan and IVF.  No fever or tachycardia.  Tried to call family earlier, no response.  Plan:  Awaiting repeat CMP and UA, likely admit.    Results for orders placed or performed during the hospital encounter of 03/13/19  Comprehensive metabolic panel  Result Value Ref Range   Sodium 143 135 - 145 mmol/L   Potassium 4.5 3.5 - 5.1 mmol/L   Chloride 105 98 - 111 mmol/L   CO2 26 22 - 32 mmol/L   Glucose, Bld 157 (H) 70 - 99 mg/dL   BUN 21 8 - 23 mg/dL   Creatinine, Ser 2.29 (H) 0.61 - 1.24 mg/dL   Calcium 8.9 8.9 - 10.3 mg/dL   Total Protein 6.5 6.5 - 8.1 g/dL   Albumin 3.2 (L) 3.5 - 5.0 g/dL   AST 34 15 - 41 U/L   ALT 19 0 - 44 U/L   Alkaline Phosphatase 73 38 - 126 U/L   Total Bilirubin 1.0 0.3 - 1.2 mg/dL   GFR calc non Af Amer 26 (L) >60 mL/min   GFR calc Af Amer 30 (L) >60 mL/min   Anion gap 12 5 - 15  CBC with Differential  Result Value Ref Range   WBC 19.1 (H) 4.0 - 10.5 K/uL   RBC 4.24 4.22 - 5.81 MIL/uL   Hemoglobin 12.8 (L) 13.0 - 17.0 g/dL   HCT 40.0 39.0 - 52.0 %   MCV 94.3 80.0 - 100.0 fL   MCH 30.2 26.0 - 34.0 pg   MCHC 32.0 30.0 - 36.0 g/dL   RDW 13.1 11.5 - 15.5 %   Platelets 167 150 - 400 K/uL   nRBC 0.0 0.0 - 0.2 %   Neutrophils Relative % 88 %   Neutro Abs 16.7 (H) 1.7 - 7.7 K/uL   Lymphocytes Relative 3 %   Lymphs Abs 0.7 0.7 - 4.0 K/uL   Monocytes Relative 8 %   Monocytes Absolute 1.5 (H) 0.1 - 1.0 K/uL   Eosinophils Relative 0 %   Eosinophils Absolute 0.0 0.0 - 0.5 K/uL   Basophils Relative 0 %   Basophils Absolute 0.1 0.0 - 0.1 K/uL   Immature Granulocytes 1 %   Abs Immature Granulocytes 0.22 (H) 0.00 - 0.07 K/uL  CBG monitoring, ED  Result Value Ref  Range   Glucose-Capillary 152 (H) 70 - 99 mg/dL   Ct Head Wo Contrast  Result Date: 03/13/2019 CLINICAL DATA:  Altered level of consciousness, increasing lethargy, confusion and weakness EXAM: CT HEAD WITHOUT CONTRAST TECHNIQUE: Contiguous axial images were obtained from the base of the skull through the vertex without intravenous contrast. COMPARISON:  CT head 03/11/2017 FINDINGS: Brain: No evidence of acute infarction, hemorrhage, hydrocephalus, extra-axial collection or mass lesion/mass effect. Symmetric prominence of the ventricles, cisterns and sulci compatible with parenchymal volume loss. Pattern of volume loss appears frontal predominant. Patchy areas of white matter hypoattenuation are most compatible with chronic microvascular angiopathy. Vascular: Atherosclerotic calcification of the carotid siphons and intradural vertebral arteries. No hyperdense vessel. Skull: No calvarial fracture or suspicious osseous lesion. No scalp swelling or hematoma. Sinuses/Orbits: There is nodular hypodense mural thickening throughout the left maxillary sinus,  left ethmoids and left frontal sinuses with more central hyperattenuation. Slightly expansile appearance of this sinonasal thickening with some thinning of the left maxillary antrum. No abnormal stranding or thickening in the retroantral fat. Poor dentition with a periapical lucency of the left maxillary first molar. Included orbital structures are unremarkable. Other: None IMPRESSION: 1. No acute intracranial abnormality. 2. Chronic microvascular ischemic changes as well as frontal predominant parenchymal volume loss, and intracranial atherosclerosis. 3. Nodular hypodense mural thickening throughout the left maxillary sinus, left ethmoids and left frontal sinuses with more central hyperattenuation. Worsening since comparison exam. 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 Slightly expansile appearance of this  sinonasal thickening with some thinning of the left maxillary antrum, therefore mucocele is not fully excluded. 4. Poor dentition with a periapical lucency of the left maxillary first molar. Correlate with dental examination. Electronically Signed   By: Kreg Shropshire M.D.   On: 03/13/2019 23:21   Dg Chest Portable 1 View  Result Date: 03/13/2019 CLINICAL DATA:  Pt brought to ED by [facility] from home live with the wife. Per family pt is been moore lethargic, confuse and weak all day today. Hx of dementia, but mostly able to walk around and take care of himself. EXAM: PORTABLE CHEST 1 VIEW COMPARISON:  Chest radiograph 08/11/2014 FINDINGS: Stable cardiomediastinal contours status post median sternotomy and CABG. Low lung volumes with bronchovascular crowding and scattered linear opacities likely reflecting atelectasis. No focal infiltrate to suggest infection. No pneumothorax or large pleural effusion. Degenerative changes are seen in the right shoulder. IMPRESSION: Low lung volumes with bronchovascular crowding and probable scattered atelectasis. No focal infiltrate to suggest infection. Electronically Signed   By: Emmaline Kluver M.D.   On: 03/13/2019 21:27     CMP appears around baseline when compared with prior, SrCr 2.29.  RN attempted in and out cath-- small amount of blood and no urine returned.  Getting IVF, condom cath placed.  Patient more calm after ativan.  COVID screen pending.  Will admit for AMS.  Discussed with Dr. Mikeal Hawthorne, he will admit for ongoing care.    Garlon Hatchet, PA-C 03/14/19 0617    Nira Conn, MD 03/14/19 [phone]

## 2019-03-13 NOTE — ED Provider Notes (Signed)
[facility] Provider Note   CSN: [id number] Arrival date & time: 03/13/19  2041     History   Chief Complaint Chief Complaint  Patient presents with  . Altered Mental Status   Level 5 caveat due to altered mental status/dementia HPI Troy Gregory is a 83 y.o. male with history of dementia, CKD, CAD status post CABG, A. fib, CHF, hypertension presents brought in by EMS for evaluation of altered mental status and generalized weakness.  Per EMS the patient has been more lethargic confused and generally weak all day today.  Reportedly able to ambulate independently and perform most ADLs despite his history of dementia.  On my assessment the patient is oriented to person only.  He is moving all extremities spontaneously.  Denies fever, headache, chest pain, numbness, weakness, abdominal pain, nausea, vomiting, urinary symptoms.  9:49 PM Attempted to call patient's wife for collateral information with no response.     The history is provided by the patient and the EMS personnel. The history is limited by the condition of the patient.    Past Medical History:  Diagnosis Date  . Arthritis    "a little in my joints" (08/11/2014)  . Atrial fibrillation (HCC) 06/2011   S/p left sided MAZE. On Coumadin, January, 2013  . Benign prostatic hypertrophy    but stable  . Calculus of kidney   . Chronic anticoagulation   . Chronic kidney disease 12/2010   with a creatinine on discharge of 1.84, was  2.1 on admission  . Chronic kidney disease (CKD), stage III (moderate)    [name]/notes 08/11/2014  . Coronary artery disease 06/2011   S/p LIMA-LAD, SVG-intermediate coronary (?), and SVG-distal RCA with concurrent MV repair and MAZE  . Dementia (HCC)   . Ejection fraction < 50%    EF previously 50%, however 20-25% by echo and catheterization before CABG  . H/O hiatal hernia   . High cholesterol   . Hypertension   . Mitral regurgitation 06/2011   Mitral valve ring  annuloplasty at the time of CABG,, January, 2013  . Myocardial infarction [facility](HCC)    "I believe I've had a heart attack; don't remember when" (3/92016)  . Shortness of breath   . Syncope    Near-syncope, etiology is deemed secondary to the excessive heat    Patient Active Problem List   Diagnosis Date Noted  . Syncope and collapse 08/11/2014  . CKD (chronic kidney disease) stage 3, GFR 30-59 ml/min 08/11/2014  . 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 Leukocytosis 08/11/2014  . PVC's (premature ventricular contractions)   . Memory loss 09/19/2011  . UTI (lower urinary tract infection) 07/01/2011  . H/O mitral valve repair-2013 06/05/2011  . Hx of CABG 2013 06/05/2011  . PAF (paroxysmal atrial fibrillation) (HCC)   . Hypertension-hypotensive on admission 12/03/2010    Past Surgical History:  Procedure Laterality Date  . CIRCUMCISION  08/12/2002   because of phimosis  . CORONARY ARTERY BYPASS GRAFT  06/06/2011    grafts times three using left internal mammary artery and right leg greater saphenous vein harvested endoscopically  . CYSTOSCOPY  02/13/2012   Procedure: CYSTOSCOPY;  Surgeon: Milford Cageaniel Young Woodruff, MD;  Location: [facility] ORS;  Service: Urology;  Laterality: N/A;  . LITHOTRIPSY    . MAZE  06/06/2011   Procedure: MAZE;  Surgeon: Delight OvensEdward B Gerhardt, MD;  [facility];  Service: Open Heart Surgery;  Laterality: N/A;  .  MITRAL VALVE REPAIR  06/06/2011   Procedure: MITRAL VALVE REPAIR (MVR);  Surgeon: [name], MD;  Location: [facility] OR;  Service: Open Heart Surgery;  Laterality: N/A;  . SATURATION BIOPSY OF PROSTATE    . TONSILLECTOMY    . TRANSURETHRAL RESECTION OF PROSTATE  02/13/2012   Procedure: TRANSURETHRAL RESECTION OF THE PROSTATE WITH GYRUS INSTRUMENTS;  Surgeon: Milford Cage, MD;  Location: [facility] ORS;  Service: Urology;  Laterality: N/A;  . TRANSURETHRAL RESECTION OF PROSTATE     hx/notes 08/11/2014        Home Medications     Prior to Admission medications   Medication Sig Start Date End Date Taking? Authorizing Provider  amLODipine (NORVASC) 5 MG tablet Take 5 mg by mouth daily.  08/13/13   [provider]  atorvastatin (LIPITOR) 40 MG tablet TAKE 1 TABLET BY MOUTH DAILY AFTER BREAKFAST 03/25/15   [place], Peter M, MD  carvedilol (COREG) 3.125 MG tablet Take 1 tablet (3.125 mg total) by mouth 2 (two) times daily with a meal. NEED OV. 03/28/18   [place], Peter M, MD  donepezil (ARICEPT) 10 MG tablet Take 10 mg by mouth at bedtime.    [provider]  finasteride (PROSCAR) 5 MG tablet Take 5 mg by mouth daily.  08/07/13   [provider]  lisinopril (PRINIVIL,ZESTRIL) 20 MG tablet Take 20 mg by mouth daily.  02/25/12   Marden Noble, MD  nitrofurantoin, macrocrystal-monohydrate, (MACROBID) 100 MG capsule Take 1 capsule (100 mg total) by mouth 2 (two) times daily. 08/12/14   Alison Murray, MD  potassium chloride (MICRO-K) 10 MEQ CR capsule Take 10 mEq by mouth 2 (two) times daily.    [provider]  potassium chloride SA (K-DUR,KLOR-CON) 20 MEQ tablet TAKE 1/2 TABLET BY MOUTH TWICE DAILY 08/08/17   [place], Peter M, MD  vitamin B-12 (CYANOCOBALAMIN) 1000 MCG tablet Take 1,000 mcg by mouth daily.    [provider]  warfarin (COUMADIN) 3 MG tablet Take as directed by coumadin clinic Patient taking differently: Take 3-6 mg by mouth daily.  daily except for  on Sundays 04/21/12   [place], Peter M, MD    Family History Family History  Problem Relation Age of Onset  . Cancer Father   . Lung cancer Father   . Diabetes Father   . Hypertension Mother   . Hypertension Brother     Social History Social History   Tobacco Use  . Smoking status: Former Smoker    Packs/day: 1.00    Years: 45.00    Pack years: 45.00    Types: Cigarettes, Cigars    Quit date: 07/01/1964    Years since quitting: 54.7  . Smokeless tobacco: Former Neurosurgeon    Types: Chew  Substance Use Topics  .  Alcohol use: Yes    Comment: 08/11/2014 "a couple shots maybe once/year now"  . Drug use: No     Allergies   Patient has no known allergies.   Review of Systems Review of Systems  Unable to perform ROS: Dementia     Physical Exam Updated Vital Signs BP (!) 131/59   Pulse (!) 58   Temp 98.9 F (37.2 C) (Oral)   Resp 18   SpO2 98%   Physical Exam Vitals signs and nursing note reviewed.  Constitutional:      General: He is not in acute distress.    Appearance: He is well-developed.  HENT:     Head: Normocephalic and atraumatic.  Comments: No Battle's signs, no raccoon's eyes, no rhinorrhea. No hemotympanum. No tenderness to palpation of the face or skull. No deformity, crepitus, or swelling noted.  Eyes:     General:        Right eye: No discharge.        Left eye: No discharge.     Extraocular Movements: Extraocular movements intact.     Conjunctiva/sclera: Conjunctivae normal.     Pupils: Pupils are equal, round, and reactive to light.  Neck:     Musculoskeletal: Neck supple.     Vascular: No JVD.     Trachea: No tracheal deviation.  Cardiovascular:     Rate and Rhythm: Normal rate and regular rhythm.     Pulses: Normal pulses.     Heart sounds: Normal heart sounds.  Pulmonary:     Effort: Pulmonary effort is normal.     Breath sounds: Normal breath sounds.  Abdominal:     General: Abdomen is flat. Bowel sounds are normal. There is no distension.     Palpations: Abdomen is soft.     Tenderness: There is no abdominal tenderness. There is no guarding or rebound.  Skin:    General: Skin is warm and dry.     Findings: No erythema.  Neurological:     Mental Status: He is alert.     Comments: Oriented to person only.  No facial droop noted.  Moves all extremities spontaneously without difficulty.  Good grip strength bilaterally.  Psychiatric:        Behavior: Behavior normal.      ED Treatments / Results  Labs (all labs ordered are listed, but only  abnormal results are displayed) Labs Reviewed  CBC WITH DIFFERENTIAL/PLATELET - Abnormal; Notable for the following components:      Result Value   WBC 19.1 (*)    Hemoglobin 12.8 (*)    Neutro Abs 16.7 (*)    Monocytes Absolute 1.5 (*)    Abs Immature Granulocytes 0.22 (*)    All other components within normal limits  CBG MONITORING, ED - Abnormal; Notable for the following components:   Glucose-Capillary 152 (*)    All other components within normal limits  URINE CULTURE  SARS CORONAVIRUS 2 (TAT 6-24 HRS)  COMPREHENSIVE METABOLIC PANEL  URINALYSIS, ROUTINE W REFLEX MICROSCOPIC    EKG EKG Interpretation  Date/Time:  Friday March 13 2019 21:51:03 EDT Ventricular Rate:  60 PR Interval:    QRS Duration: 93 QT Interval:  494 QTC Calculation: 494 R Axis:   -29 Text Interpretation:  Sinus rhythm Borderline left axis deviation Borderline T wave abnormalities Borderline prolonged QT interval No significant change since last tracing Confirmed by Marily Memos [phone]) on 03/13/2019 11:47:45 PM   Radiology Ct Head Wo Contrast  Result Date: 03/13/2019 CLINICAL DATA:  Altered level of consciousness, increasing lethargy, confusion and weakness EXAM: CT HEAD WITHOUT CONTRAST TECHNIQUE: Contiguous axial images were obtained from the base of the skull through the vertex without intravenous contrast. COMPARISON:  CT head 04/01/2017 FINDINGS: Brain: No evidence of acute infarction, hemorrhage, hydrocephalus, extra-axial collection or mass lesion/mass effect. Symmetric prominence of the ventricles, cisterns and sulci compatible with parenchymal volume loss. Pattern of volume loss appears frontal predominant. Patchy areas of white matter hypoattenuation are most compatible with chronic microvascular angiopathy. Vascular: Atherosclerotic calcification of the carotid siphons and intradural vertebral arteries. No hyperdense vessel. Skull: No calvarial fracture or suspicious osseous lesion. No scalp  swelling or hematoma. Sinuses/Orbits: There is nodular hypodense  mural thickening throughout the left maxillary sinus, left ethmoids and left frontal sinuses with more central hyperattenuation. Slightly expansile appearance of this sinonasal thickening with some thinning of the left maxillary antrum. No abnormal stranding or thickening in the retroantral fat. Poor dentition with a periapical lucency of the left maxillary first molar. Included orbital structures are unremarkable. Other: None IMPRESSION: 1. No acute intracranial abnormality. 2. Chronic microvascular ischemic changes as well as frontal predominant parenchymal volume loss, and intracranial atherosclerosis. 3. Nodular hypodense mural thickening throughout the left maxillary sinus, left ethmoids and left frontal sinuses with more central hyperattenuation. Worsening since comparison exam. 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 Slightly expansile appearance of this sinonasal thickening with some thinning of the left maxillary antrum, therefore mucocele is not fully excluded. 4. Poor dentition with a periapical lucency of the left maxillary first molar. Correlate with dental examination. Electronically Signed   By: Lovena Le M.D.   On: 03/13/2019 23:21   Dg Chest Portable 1 View  Result Date: 03/13/2019 CLINICAL DATA:  Pt brought to ED by [facility] from home live with the wife. Per family pt is been moore lethargic, confuse and weak all day today. Hx of dementia, but mostly able to walk around and take care of himself. EXAM: PORTABLE CHEST 1 VIEW COMPARISON:  Chest radiograph 08/11/2014 FINDINGS: Stable cardiomediastinal contours status post median sternotomy and CABG. Low lung volumes with bronchovascular crowding and scattered linear opacities likely reflecting atelectasis. No focal infiltrate to suggest infection. No pneumothorax or large pleural effusion. Degenerative changes are seen in the  right shoulder. IMPRESSION: Low lung volumes with bronchovascular crowding and probable scattered atelectasis. No focal infiltrate to suggest infection. Electronically Signed   By: Audie Pinto M.D.   On: 03/13/2019 21:27    Procedures Procedures (including critical care time)  Medications Ordered in ED Medications  LORazepam (ATIVAN) injection 0.5 mg (0.5 mg Intravenous Given 03/13/19 2245)  sodium chloride 0.9 % bolus 500 mL (500 mLs Intravenous New Bag/Given 03/13/19 2212)     Initial Impression / Assessment and Plan / ED Course  I have reviewed the triage vital signs and the nursing notes.  Pertinent labs & imaging results that were available during my care of the patient were reviewed by me and considered in my medical decision making (see chart for details).        Patient presenting for evaluation of altered mental status and generalized weakness.  He is afebrile, vital signs are stable.  He is nontoxic in appearance.  He is oriented only to person, unsure of his baseline.  No focal neurologic deficits noted.  Attempted to call patient's wife for collateral information but was unable to get a hold of her.   Lungs clear to auscultation bilaterally.  Will obtain labs, UA, chest x-ray, and head CT for further evaluation of his symptoms.   Head CT shows no acute intracranial abnormalities but does show a nodular hypodense area throughout the left maxillary sinus suggestive of sinus disease including sinonasal polyposis with possible fungal sinusitis.  Chest x-ray shows no cardiopulmonary abnormalities, no definite evidence of pneumonia or effusion.  CBC reviewed by myself shows leukocytosis, mild anemia, no thrombocytopenia.  11:53 PM Signed out care to oncoming provider PA Sanders. Pending UA and CMP. Will likely require admission to the hospital due to generalized weakness, altered mental status.  He does not appear to be overtly septic at this time however with leukocytosis of  19.1  on CBC question possible underlying infection (?fungal sinusitis) as the cause of his symptoms. Small fluid bolus given. COVID test ordered.     Final Clinical Impressions(s) / ED Diagnoses   Final diagnoses:  Altered mental status, unspecified altered mental status type  Generalized weakness    ED Discharge Orders    None       Bennye Alm 03/13/19 2353    Benjiman Core, MD 03/14/19 1450

## 2019-03-13 NOTE — ED Triage Notes (Signed)
VS by EMS Temp 99.2, BP 130/90, R-20, SPO2 92% on RA 99% on 2L [place]. CBG 190

## 2019-03-13 NOTE — ED Triage Notes (Signed)
Pt brought to ED by [facility] from home live with the wife. Per family pt is been moore lethargic, confuse and weak all day today. Hx of dementia, but mostly able to walk around and take care of himself.

## 2019-03-14 DIAGNOSIS — F329 Major depressive disorder, single episode, unspecified: Secondary | ICD-10-CM | POA: Diagnosis present

## 2019-03-14 DIAGNOSIS — R4182 Altered mental status, unspecified: Secondary | ICD-10-CM | POA: Diagnosis not present

## 2019-03-14 DIAGNOSIS — J328 Other chronic sinusitis: Secondary | ICD-10-CM | POA: Diagnosis present

## 2019-03-14 DIAGNOSIS — R413 Other amnesia: Secondary | ICD-10-CM | POA: Diagnosis not present

## 2019-03-14 DIAGNOSIS — N390 Urinary tract infection, site not specified: Secondary | ICD-10-CM | POA: Diagnosis present

## 2019-03-14 DIAGNOSIS — I2510 Atherosclerotic heart disease of native coronary artery without angina pectoris: Secondary | ICD-10-CM | POA: Diagnosis present

## 2019-03-14 DIAGNOSIS — N183 Chronic kidney disease, stage 3 unspecified: Secondary | ICD-10-CM | POA: Diagnosis present

## 2019-03-14 DIAGNOSIS — I130 Hypertensive heart and chronic kidney disease with heart failure and stage 1 through stage 4 chronic kidney disease, or unspecified chronic kidney disease: Secondary | ICD-10-CM | POA: Diagnosis present

## 2019-03-14 DIAGNOSIS — Z66 Do not resuscitate: Secondary | ICD-10-CM | POA: Diagnosis not present

## 2019-03-14 DIAGNOSIS — R451 Restlessness and agitation: Secondary | ICD-10-CM | POA: Diagnosis not present

## 2019-03-14 DIAGNOSIS — G9341 Metabolic encephalopathy: Secondary | ICD-10-CM | POA: Diagnosis present

## 2019-03-14 DIAGNOSIS — R627 Adult failure to thrive: Secondary | ICD-10-CM | POA: Diagnosis present

## 2019-03-14 DIAGNOSIS — I5042 Chronic combined systolic (congestive) and diastolic (congestive) heart failure: Secondary | ICD-10-CM | POA: Diagnosis present

## 2019-03-14 DIAGNOSIS — L89152 Pressure ulcer of sacral region, stage 2: Secondary | ICD-10-CM | POA: Diagnosis present

## 2019-03-14 DIAGNOSIS — Z515 Encounter for palliative care: Secondary | ICD-10-CM | POA: Diagnosis not present

## 2019-03-14 DIAGNOSIS — E860 Dehydration: Secondary | ICD-10-CM | POA: Diagnosis present

## 2019-03-14 DIAGNOSIS — F0390 Unspecified dementia without behavioral disturbance: Secondary | ICD-10-CM | POA: Diagnosis present

## 2019-03-14 DIAGNOSIS — R531 Weakness: Secondary | ICD-10-CM | POA: Diagnosis present

## 2019-03-14 DIAGNOSIS — N1831 Chronic kidney disease, stage 3a: Secondary | ICD-10-CM | POA: Diagnosis not present

## 2019-03-14 DIAGNOSIS — Z951 Presence of aortocoronary bypass graft: Secondary | ICD-10-CM

## 2019-03-14 DIAGNOSIS — Z833 Family history of diabetes mellitus: Secondary | ICD-10-CM | POA: Diagnosis not present

## 2019-03-14 DIAGNOSIS — N400 Enlarged prostate without lower urinary tract symptoms: Secondary | ICD-10-CM | POA: Diagnosis present

## 2019-03-14 DIAGNOSIS — I480 Paroxysmal atrial fibrillation: Secondary | ICD-10-CM | POA: Diagnosis present

## 2019-03-14 DIAGNOSIS — B9620 Unspecified Escherichia coli [E. coli] as the cause of diseases classified elsewhere: Secondary | ICD-10-CM | POA: Diagnosis present

## 2019-03-14 DIAGNOSIS — Z801 Family history of malignant neoplasm of trachea, bronchus and lung: Secondary | ICD-10-CM | POA: Diagnosis not present

## 2019-03-14 DIAGNOSIS — E876 Hypokalemia: Secondary | ICD-10-CM | POA: Diagnosis present

## 2019-03-14 DIAGNOSIS — Z87891 Personal history of nicotine dependence: Secondary | ICD-10-CM | POA: Diagnosis not present

## 2019-03-14 DIAGNOSIS — Z20828 Contact with and (suspected) exposure to other viral communicable diseases: Secondary | ICD-10-CM | POA: Diagnosis present

## 2019-03-14 LAB — COMPREHENSIVE METABOLIC PANEL
ALT: 17 U/L (ref 0–44)
ALT: 19 U/L (ref 0–44)
AST: 29 U/L (ref 15–41)
AST: 34 U/L (ref 15–41)
Albumin: 3 g/dL — ABNORMAL LOW (ref 3.5–5.0)
Albumin: 3.2 g/dL — ABNORMAL LOW (ref 3.5–5.0)
Alkaline Phosphatase: 68 U/L (ref 38–126)
Alkaline Phosphatase: 73 U/L (ref 38–126)
Anion gap: 12 (ref 5–15)
Anion gap: 13 (ref 5–15)
BUN: 21 mg/dL (ref 8–23)
BUN: 22 mg/dL (ref 8–23)
CO2: 23 mmol/L (ref 22–32)
CO2: 26 mmol/L (ref 22–32)
Calcium: 8.9 mg/dL (ref 8.9–10.3)
Calcium: 9 mg/dL (ref 8.9–10.3)
Chloride: 105 mmol/L (ref 98–111)
Chloride: 105 mmol/L (ref 98–111)
Creatinine, Ser: 2.09 mg/dL — ABNORMAL HIGH (ref 0.61–1.24)
Creatinine, Ser: 2.29 mg/dL — ABNORMAL HIGH (ref 0.61–1.24)
GFR calc Af Amer: 30 mL/min — ABNORMAL LOW (ref >60)
GFR calc Af Amer: 33 mL/min — ABNORMAL LOW (ref >60)
GFR calc non Af Amer: 26 mL/min — ABNORMAL LOW (ref >60)
GFR calc non Af Amer: 29 mL/min — ABNORMAL LOW (ref >60)
Glucose, Bld: 157 mg/dL — ABNORMAL HIGH (ref 70–99)
Glucose, Bld: 157 mg/dL — ABNORMAL HIGH (ref 70–99)
Potassium: 4.1 mmol/L (ref 3.5–5.1)
Potassium: 4.5 mmol/L (ref 3.5–5.1)
Sodium: 141 mmol/L (ref 135–145)
Sodium: 143 mmol/L (ref 135–145)
Total Bilirubin: 1 mg/dL (ref 0.3–1.2)
Total Bilirubin: 1.3 mg/dL — ABNORMAL HIGH (ref 0.3–1.2)
Total Protein: 6.5 g/dL (ref 6.5–8.1)
Total Protein: 6.7 g/dL (ref 6.5–8.1)

## 2019-03-14 LAB — URINALYSIS, ROUTINE W REFLEX MICROSCOPIC
Bilirubin Urine: NEGATIVE
Glucose, UA: NEGATIVE mg/dL
Ketones, ur: NEGATIVE mg/dL
Nitrite: NEGATIVE
Protein, ur: 100 mg/dL — AB
RBC / HPF: >50 RBC/hpf — ABNORMAL HIGH (ref 0–5)
Specific Gravity, Urine: 1.018 (ref 1.005–1.030)
WBC, UA: >50 WBC/hpf — ABNORMAL HIGH (ref 0–5)
pH: 6 (ref 5.0–8.0)

## 2019-03-14 LAB — CBC
HCT: 38.1 % — ABNORMAL LOW (ref 39.0–52.0)
Hemoglobin: 12.4 g/dL — ABNORMAL LOW (ref 13.0–17.0)
MCH: 30.8 pg (ref 26.0–34.0)
MCHC: 32.5 g/dL (ref 30.0–36.0)
MCV: 94.8 fL (ref 80.0–100.0)
Platelets: 136 10*3/uL — ABNORMAL LOW (ref 150–400)
RBC: 4.02 MIL/uL — ABNORMAL LOW (ref 4.22–5.81)
RDW: 13.2 % (ref 11.5–15.5)
WBC: 17.4 10*3/uL — ABNORMAL HIGH (ref 4.0–10.5)
nRBC: 0 % (ref 0.0–0.2)

## 2019-03-14 LAB — TSH: TSH: 1.429 u[IU]/mL (ref 0.350–4.500)

## 2019-03-14 LAB — SARS CORONAVIRUS 2 (TAT 6-24 HRS): SARS Coronavirus 2: NEGATIVE

## 2019-03-14 MED ORDER — ONDANSETRON HCL 4 MG PO TABS: 4.0000 mg | ORAL_TABLET | Freq: Four times a day (QID) | ORAL | Status: DC | PRN

## 2019-03-14 MED ORDER — ONDANSETRON HCL 4 MG/2ML IJ SOLN: 4.0000 mg | Freq: Four times a day (QID) | INTRAMUSCULAR | Status: DC | PRN

## 2019-03-14 MED ORDER — HEPARIN SODIUM (PORCINE) 5000 UNIT/ML IJ SOLN
5000.0000 [IU] | Freq: Three times a day (TID) | INTRAMUSCULAR | Status: DC
  Administered 2019-03-14 – 2019-03-15 (×3): 5000 [IU] via SUBCUTANEOUS
  Filled 2019-03-14 (×2): qty 1

## 2019-03-14 MED ORDER — DEXTROSE-NACL 5-0.45 % IV SOLN
INTRAVENOUS | Status: DC
  Administered 2019-03-14 – 2019-03-19 (×7): via INTRAVENOUS

## 2019-03-14 MED ORDER — ACETAMINOPHEN 325 MG PO TABS: 650.0000 mg | ORAL_TABLET | Freq: Four times a day (QID) | ORAL | Status: DC | PRN

## 2019-03-14 MED ORDER — ACETAMINOPHEN 650 MG RE SUPP: 650.0000 mg | Freq: Four times a day (QID) | RECTAL | Status: DC | PRN

## 2019-03-14 NOTE — ED Notes (Signed)
Attempted to in and out cath pt. Noted blood and pulled out. No pain from pt. Unable to obtain urine. Condom cath placed on pt at this time.

## 2019-03-14 NOTE — ED Notes (Signed)
Heart healthy lunch tray ordered 

## 2019-03-14 NOTE — Plan of Care (Signed)
83 year old male history of dementia admitted with change in mental status.  History of atrial fibrillation CKD stage III CAD CABG diastolic dysfunction.  At his baseline he ambulates and does all his ADLs without assistance.  Patient admitted for occult infection generalized weakness and altered mental status.. Staff reports patient is not had anything to eat since admission.  He is maintained on IV fluids.  Continue IV fluids follow-up cultures.  When I saw him today he was confused trying to get out of bed did not answer any questions appropriately.  I will continue IV fluids monitor for signs of infection.

## 2019-03-14 NOTE — H&P (Signed)
History and Physical   Troy Gregory QQV:[id number] DOB: March 24, 1936 DOA: 03/13/2019  Referring MD/NP/PA: Quincy Carnes, PA  PCP: Josetta Huddle, MD   Outpatient Specialists: None  Patient coming from: Home  Chief Complaint: Altered mental status  HPI: Troy Gregory is a 83 y.o. male with medical history significant of dementia, atrial fibrillation, chronic kidney disease stage III, coronary artery disease status post coronary artery bypass grafting, diastolic dysfunction CHF, history of maze procedure in 2013 who presents to the ER with confusion and generalized weakness.  Patient has been weak and lethargic all day.  He is usually able to move around and does all his ADLs without difficulty.  He is got history of dementia but still communicating adequately.  Patient communicating even now and denying any chest pain, no fever no chills.  Not sure of any sick contacts.  Patient is oriented in person only but denies any dysuria.  He was noted to have significant leukocytosis.  CT head showed possible sinusitis.  No evidence of UTI or pneumonia.  Patient is being admitted with weakness and possibly some occult infection.  ED Course: Temperature is 98.9 blood pressure 153/119 pulse 67 respiratory of 23 oxygen sat 91% on room air.  White count is 19.1 hemoglobin 12.8 otherwise CBC within normal.  Chemistry showed glucose 157 and creatinine 2.29 albumin 3.2 the rest appears to be within normal.  COVID-19 screen is pending.  Patient being admitted with altered mental status confusion probably due to dementia.  Review of Systems: As per HPI otherwise 10 point review of systems negative.    Past Medical History:  Diagnosis Date  . Arthritis    "a little in my joints" (08/11/2014)  . Atrial fibrillation ([place]) 06/2011   S/p left sided MAZE. On Coumadin, January, 2013  . Benign prostatic hypertrophy    but stable  . Calculus of kidney   . Chronic anticoagulation   . Chronic kidney disease 12/2010   with  a creatinine on discharge of 1.84, was  2.1 on admission  . Chronic kidney disease (CKD), stage III (moderate)    Troy Gregory 08/11/2014  . Coronary artery disease 06/2011   S/p LIMA-LAD, SVG-intermediate coronary (?), and SVG-distal RCA with concurrent MV repair and MAZE  . Dementia ([place])   . Ejection fraction < 50%    EF previously 50%, however 20-25% by echo and catheterization before CABG  . H/O hiatal hernia   . High cholesterol   . Hypertension   . Mitral regurgitation 06/2011   Mitral valve ring annuloplasty at the time of CABG,, January, 2013  . Myocardial infarction [facility])    "I believe I've had a heart attack; don't remember when" (3/92016)  . Shortness of breath   . Syncope    Near-syncope, etiology is deemed secondary to the excessive heat    Past Surgical History:  Procedure Laterality Date  . CIRCUMCISION  08/12/2002   because of phimosis  . CORONARY ARTERY BYPASS GRAFT  06/06/2011    grafts times three using left internal mammary artery and right leg greater saphenous vein harvested endoscopically  . CYSTOSCOPY  02/13/2012   Procedure: CYSTOSCOPY;  Surgeon: Molli Hazard, MD;  Location: [facility];  Service: Urology;  Laterality: N/A;  . LITHOTRIPSY    . MAZE  06/06/2011   Procedure: MAZE;  Surgeon: Grace Isaac, MD;  Location: [facility];  Service: Open Heart Surgery;  Laterality: N/A;  . MITRAL VALVE REPAIR  06/06/2011   Procedure: MITRAL VALVE  REPAIR (MVR);  Surgeon: Delight OvensEdward B Gerhardt, MD;  [facility];  Service: Open Heart Surgery;  Laterality: N/A;  . SATURATION BIOPSY OF PROSTATE    . TONSILLECTOMY    . TRANSURETHRAL RESECTION OF PROSTATE  02/13/2012   Procedure: TRANSURETHRAL RESECTION OF THE PROSTATE WITH GYRUS INSTRUMENTS;  Surgeon: Milford Cageaniel Young Woodruff, MD;  Location: [facility];  Service: Urology;  Laterality: N/A;  . TRANSURETHRAL RESECTION OF PROSTATE     hx/notes 08/11/2014     reports that he quit smoking about 54 years ago. His smoking use included cigarettes  and cigars. He has a 45.00 pack-year smoking history. He has quit using smokeless tobacco.  His smokeless tobacco use included chew. He reports current alcohol use. He reports that he does not use drugs.  No Known Allergies  Family History  Problem Relation Age of Onset  . Cancer Father   . Lung cancer Father   . Diabetes Father   . Hypertension Mother   . Hypertension Brother      Prior to Admission medications   Medication Sig Start Date End Date Taking? Authorizing Provider  amLODipine (NORVASC) 5 MG tablet Take 5 mg by mouth daily.  08/13/13   [provider]  atorvastatin (LIPITOR) 40 MG tablet TAKE 1 TABLET BY MOUTH DAILY AFTER BREAKFAST 03/25/15   [place]il[place]Jordan, Peter M, MD  carvedilol (COREG) 3.125 MG tablet Take 1 tablet (3.125 mg total) by mouth 2 (two) times daily with a meal. NEED OV. 03/28/18   [place], Peter M, MD  donepezil (ARICEPT) 10 MG tablet Take 10 mg by mouth at bedtime.    [provider]  finasteride (PROSCAR) 5 MG tablet Take 5 mg by mouth daily.  08/07/13   [provider]  lisinopril (PRINIVIL,ZESTRIL) 20 MG tablet Take 20 mg by mouth daily.  02/25/12   Marden NobleGates, Robert, MD  nitrofurantoin, macrocrystal-monohydrate, (MACROBID) 100 MG capsule Take 1 capsule (100 mg total) by mouth 2 (two) times daily. 08/12/14   Alison Murrayevine, Alma M, MD  potassium chloride (MICRO-K) 10 MEQ CR capsule Take 10 mEq by mouth 2 (two) times daily.    [provider]  potassium chloride SA (K-DUR,KLOR-CON) 20 MEQ tablet TAKE 1/2 TABLET BY MOUTH TWICE DAILY 08/08/17   [place]Jordan, Peter M, MD  vitamin B-12 (CYANOCOBALAMIN) 1000 MCG tablet Take 1,000 mcg by mouth daily.    [provider]  warfarin (COUMADIN) 3 MG tablet Take as directed by coumadin clinic Patient taking differently: Take 3-6 mg by mouth daily. 3mg  daily except for 6mg  on Sundays 04/21/12   [place], Peter M, MD    Physical Exam: Vitals:   03/14/19 0245 03/14/19 0300 03/14/19 0321 03/14/19 0400  BP:  133/70 134/70 129/66 130/76  Pulse: 63 63 65 67  Resp: (!) 22 20 (!) 23 20  Temp:      TempSrc:      SpO2: 95% 96% 96% 95%      Constitutional: NAD, confused but not agitated Vitals:   03/14/19 0245 03/14/19 0300 03/14/19 0321 03/14/19 0400  BP: 133/70 134/70 129/66 130/76  Pulse: 63 63 65 67  Resp: (!) 22 20 (!) 23 20  Temp:      TempSrc:      SpO2: 95% 96% 96% 95%   Eyes: PERRL, lids and conjunctivae normal ENMT: Mucous membranes are dry. Posterior pharynx clear of any exudate or lesions.Normal dentition.  Neck: normal, supple, no masses, no thyromegaly Respiratory: clear to auscultation bilaterally, no wheezing, no crackles. Normal respiratory  effort. No accessory muscle use.  Cardiovascular: Regular rate and rhythm, no murmurs / rubs / gallops. No extremity edema. 2+ pedal pulses. No carotid bruits.  Abdomen: no tenderness, no masses palpated. No hepatosplenomegaly. Bowel sounds positive.  Musculoskeletal: no clubbing / cyanosis. No joint deformity upper and lower extremities. Good ROM, no contractures. Normal muscle tone.  Skin: no rashes, lesions, ulcers. No induration Neurologic: CN 2-12 grossly intact. Sensation intact, DTR normal. Strength 5/5 in all 4.  Psychiatric: Confused, communicating, disoriented in place and time normal mood.     Labs on Admission: I have personally reviewed following labs and imaging studies  CBC: Recent Labs  Lab 03/13/19 2103 03/14/19 0350  WBC 19.1* 17.4*  NEUTROABS 16.7*  --   HGB 12.8* 12.4*  HCT 40.0 38.1*  MCV 94.3 94.8  PLT 167 136*   Basic Metabolic Panel: Recent Labs  Lab 03/13/19 2346 03/14/19 0350  NA 143 141  K 4.5 4.1  CL 105 105  CO2 26 23  GLUCOSE 157* 157*  BUN 21 22  CREATININE 2.29* 2.09*  CALCIUM 8.9 9.0   GFR: CrCl cannot be calculated (Unknown ideal weight.). Liver Function Tests: Recent Labs  Lab 03/13/19 2346 03/14/19 0350  AST 34 29  ALT 19 17  ALKPHOS 73 68  BILITOT 1.0 1.3*  PROT 6.5  6.7  ALBUMIN 3.2* 3.0*   No results for input(s): LIPASE, AMYLASE in the last 168 hours. No results for input(s): AMMONIA in the last 168 hours. Coagulation Profile: No results for input(s): INR, PROTIME in the last 168 hours. Cardiac Enzymes: No results for input(s): CKTOTAL, CKMB, CKMBINDEX, TROPONINI in the last 168 hours. BNP (last 3 results) No results for input(s): PROBNP in the last 8760 hours. HbA1C: No results for input(s): HGBA1C in the last 72 hours. CBG: Recent Labs  Lab 03/13/19 2159  GLUCAP 152*   Lipid Profile: No results for input(s): CHOL, HDL, LDLCALC, TRIG, CHOLHDL, LDLDIRECT in the last 72 hours. Thyroid Function Tests: Recent Labs    03/14/19 0351  TSH 1.429   Anemia Panel: No results for input(s): VITAMINB12, FOLATE, FERRITIN, TIBC, IRON, RETICCTPCT in the last 72 hours. Urine analysis:    Component Value Date/Time   COLORURINE YELLOW 03/09/2017 1442   APPEARANCEUR CLEAR 03/07/2017 1442   LABSPEC 1.017 03/11/2017 1442   PHURINE 6.0 03/24/2017 1442   GLUCOSEU NEGATIVE 03/30/2017 1442   HGBUR MODERATE (A) 04/01/2017 1442   BILIRUBINUR NEGATIVE 03/27/2017 1442   KETONESUR NEGATIVE 03/11/2017 1442   PROTEINUR 100 (A) 03/18/2017 1442   UROBILINOGEN 1.0 08/11/2014 1306   NITRITE NEGATIVE 03/11/2017 1442   LEUKOCYTESUR NEGATIVE 03/07/2017 1442   Sepsis Labs: @LABRCNTIP (procalcitonin:4,lacticidven:4) )No results found for this or any previous visit (from the past 240 hour(s)).   Radiological Exams on Admission: Ct Head Wo Contrast  Result Date: 03/13/2019 CLINICAL DATA:  Altered level of consciousness, increasing lethargy, confusion and weakness EXAM: CT HEAD WITHOUT CONTRAST TECHNIQUE: Contiguous axial images were obtained from the base of the skull through the vertex without intravenous contrast. COMPARISON:  CT head 03/05/2017 FINDINGS: Brain: No evidence of acute infarction, hemorrhage, hydrocephalus, extra-axial collection or mass lesion/mass  effect. Symmetric prominence of the ventricles, cisterns and sulci compatible with parenchymal volume loss. Pattern of volume loss appears frontal predominant. Patchy areas of white matter hypoattenuation are most compatible with chronic microvascular angiopathy. Vascular: Atherosclerotic calcification of the carotid siphons and intradural vertebral arteries. No hyperdense vessel. Skull: No calvarial fracture or suspicious osseous lesion. No scalp swelling  or hematoma. Sinuses/Orbits: There is nodular hypodense mural thickening throughout the left maxillary sinus, left ethmoids and left frontal sinuses with more central hyperattenuation. Slightly expansile appearance of this sinonasal thickening with some thinning of the left maxillary antrum. No abnormal stranding or thickening in the retroantral fat. Poor dentition with a periapical lucency of the left maxillary first molar. Included orbital structures are unremarkable. Other: None IMPRESSION: 1. No acute intracranial abnormality. 2. Chronic microvascular ischemic changes as well as frontal predominant parenchymal volume loss, and intracranial atherosclerosis. 3. Nodular hypodense mural thickening throughout the left maxillary sinus, left ethmoids and left frontal sinuses with more central hyperattenuation. Worsening since comparison exam. 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 Slightly expansile appearance of this sinonasal thickening with some thinning of the left maxillary antrum, therefore mucocele is not fully excluded. 4. Poor dentition with a periapical lucency of the left maxillary first molar. Correlate with dental examination. Electronically Signed   By: Kreg Shropshire M.D.   On: 03/13/2019 23:21   Dg Chest Portable 1 View  Result Date: 03/13/2019 CLINICAL DATA:  Pt brought to ED by [facility] from home live with the wife. Per family pt is been moore lethargic, confuse and weak all day today. Hx of  dementia, but mostly able to walk around and take care of himself. EXAM: PORTABLE CHEST 1 VIEW COMPARISON:  Chest radiograph 08/11/2014 FINDINGS: Stable cardiomediastinal contours status post median sternotomy and CABG. Low lung volumes with bronchovascular crowding and scattered linear opacities likely reflecting atelectasis. No focal infiltrate to suggest infection. No pneumothorax or large pleural effusion. Degenerative changes are seen in the right shoulder. IMPRESSION: Low lung volumes with bronchovascular crowding and probable scattered atelectasis. No focal infiltrate to suggest infection. Electronically Signed   By: Emmaline Kluver M.D.   On: 03/13/2019 21:27    EKG: Independently reviewed.  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  Dehydration noted.  No evidence of pneumonia or UTI.  Patient will be admitted and monitored.  Hydrate patient.  PT and OT consultation.  May require MRI of the brain to rule out TIA or CVA.  #2 paroxysmal atrial fibrillation: In sinus rhythm now.  Continue monitor  #3 diastolic dysfunction CHF: Appears compensated  #4 coronary artery disease: History of coronary artery bypass grafting.  Stable.  Check enzymes  #5 dementia: Currently confused.  May be related to the dementia.  Frequent neuro checks   DVT prophylaxis: Heparin Code Status: Full code Family Communication: No family at bedside Disposition Plan: Home Consults called: None Admission status: Inpatient  Severity of Illness: The appropriate patient status for this patient is INPATIENT. Inpatient  status is judged to be reasonable and necessary in order to provide the required intensity of service to ensure the patient's safety. The patient's presenting symptoms, physical exam findings, and initial radiographic and laboratory data in the context of their chronic comorbidities is felt to place them at high risk for further clinical deterioration. Furthermore, it is not anticipated that the patient will be medically stable for discharge from the hospital within  2 midnights of admission. The following factors support the patient status of inpatient.   " The patient's presenting symptoms include altered mental status. " The worrisome physical exam findings include confusion. " The initial radiographic and laboratory data are worrisome because of evidence of dehydration. " The chronic co-morbidities include dementia.   * I certify that at the point of admission it is my clinical judgment that the patient will require inpatient hospital care spanning beyond 2 midnights from the point of admission due to high intensity of service, high risk for further deterioration and high frequency of surveillance required.Lonia Blood MD [facility] Pager [phone]  If 7PM-7AM, please contact night-coverage [email or web address] Password [email or web address]  03/14/2019, 5:22 AM

## 2019-03-15 DIAGNOSIS — L8990 Pressure ulcer of unspecified site, unspecified stage: Secondary | ICD-10-CM | POA: Insufficient documentation

## 2019-03-15 LAB — CBC
HCT: 35.8 % — ABNORMAL LOW (ref 39.0–52.0)
Hemoglobin: 12.2 g/dL — ABNORMAL LOW (ref 13.0–17.0)
MCH: 31.1 pg (ref 26.0–34.0)
MCHC: 34.1 g/dL (ref 30.0–36.0)
MCV: 91.3 fL (ref 80.0–100.0)
Platelets: 131 10*3/uL — ABNORMAL LOW (ref 150–400)
RBC: 3.92 MIL/uL — ABNORMAL LOW (ref 4.22–5.81)
RDW: 13.2 % (ref 11.5–15.5)
WBC: 14.1 10*3/uL — ABNORMAL HIGH (ref 4.0–10.5)
nRBC: 0 % (ref 0.0–0.2)

## 2019-03-15 LAB — COMPREHENSIVE METABOLIC PANEL
ALT: 28 U/L (ref 0–44)
AST: 66 U/L — ABNORMAL HIGH (ref 15–41)
Albumin: 2.8 g/dL — ABNORMAL LOW (ref 3.5–5.0)
Alkaline Phosphatase: 76 U/L (ref 38–126)
Anion gap: 8 (ref 5–15)
BUN: 26 mg/dL — ABNORMAL HIGH (ref 8–23)
CO2: 24 mmol/L (ref 22–32)
Calcium: 9 mg/dL (ref 8.9–10.3)
Chloride: 106 mmol/L (ref 98–111)
Creatinine, Ser: 1.76 mg/dL — ABNORMAL HIGH (ref 0.61–1.24)
GFR calc Af Amer: 41 mL/min — ABNORMAL LOW (ref >60)
GFR calc non Af Amer: 35 mL/min — ABNORMAL LOW (ref >60)
Glucose, Bld: 130 mg/dL — ABNORMAL HIGH (ref 70–99)
Potassium: 3.9 mmol/L (ref 3.5–5.1)
Sodium: 138 mmol/L (ref 135–145)
Total Bilirubin: 0.8 mg/dL (ref 0.3–1.2)
Total Protein: 6.2 g/dL — ABNORMAL LOW (ref 6.5–8.1)

## 2019-03-15 LAB — PROTIME-INR
INR: 2 — ABNORMAL HIGH (ref 0.8–1.2)
Prothrombin Time: 22.4 seconds — ABNORMAL HIGH (ref 11.4–15.2)

## 2019-03-15 LAB — CK: Total CK: 783 U/L — ABNORMAL HIGH (ref 49–397)

## 2019-03-15 MED ORDER — SODIUM CHLORIDE 0.9 % IV SOLN
1.0000 g | Freq: Once | INTRAVENOUS | Status: AC
  Administered 2019-03-15: 1 g via INTRAVENOUS
  Filled 2019-03-15: qty 10

## 2019-03-15 MED ORDER — ATORVASTATIN CALCIUM 40 MG PO TABS
40.0000 mg | ORAL_TABLET | Freq: Every day | ORAL | Status: DC
  Administered 2019-03-15 – 2019-03-17 (×2): 40 mg via ORAL
  Filled 2019-03-15 (×3): qty 1

## 2019-03-15 MED ORDER — FINASTERIDE 5 MG PO TABS
5.0000 mg | ORAL_TABLET | Freq: Every day | ORAL | Status: DC
  Administered 2019-03-15 – 2019-03-19 (×5): 5 mg via ORAL
  Filled 2019-03-15 (×5): qty 1

## 2019-03-15 MED ORDER — WARFARIN SODIUM 3 MG PO TABS: 3.0000 mg | ORAL_TABLET | Freq: Every day | ORAL | Status: DC

## 2019-03-15 MED ORDER — WARFARIN SODIUM 3 MG PO TABS
3.0000 mg | ORAL_TABLET | Freq: Once | ORAL | Status: AC
  Administered 2019-03-15: 3 mg via ORAL
  Filled 2019-03-15: qty 1

## 2019-03-15 MED ORDER — WARFARIN - PHARMACIST DOSING INPATIENT
Freq: Every day | Status: DC
  Administered 2019-03-16 – 2019-03-17 (×2)

## 2019-03-15 MED ORDER — FLUCONAZOLE 50 MG PO TABS
50.0000 mg | ORAL_TABLET | Freq: Every day | ORAL | Status: DC
  Administered 2019-03-15 – 2019-03-16 (×2): 50 mg via ORAL
  Filled 2019-03-15 (×2): qty 1

## 2019-03-15 MED ORDER — SERTRALINE HCL 50 MG PO TABS
50.0000 mg | ORAL_TABLET | Freq: Every day | ORAL | Status: DC
  Administered 2019-03-16 – 2019-03-18 (×3): 50 mg via ORAL
  Filled 2019-03-15 (×4): qty 1

## 2019-03-15 NOTE — Progress Notes (Addendum)
ANTICOAGULATION CONSULT NOTE - Initial Consult  Pharmacy Consult for Warfarin Indication: atrial fibrillation  No Known Allergies  Patient Measurements: Height: 6\' 1"  (185.4 cm) Weight: 174 lb (78.9 kg) IBW/kg (Calculated) : 79.9  Vital Signs: Temp: 97.8 F (36.6 C) (10/11 1200) Temp Source: Oral (10/11 1200) BP: 131/71 (10/11 1200) Pulse Rate: 52 (10/11 1200)  Labs: Recent Labs    03/13/19 2103 03/13/19 2346 03/14/19 0350 03/15/19 1158  HGB 12.8*  --  12.4* 12.2*  HCT 40.0  --  38.1* 35.8*  PLT 167  --  136* 131*  LABPROT  --   --   --  22.4*  INR  --   --   --  2.0*  CREATININE  --  2.29* 2.09* 1.76*  CKTOTAL  --   --   --  783*    Estimated Creatinine Clearance: 36.7 mL/min (A) (by C-G formula based on SCr of 1.76 mg/dL (H)).   Medical History: Past Medical History:  Diagnosis Date  . Arthritis    "a little in my joints" (08/11/2014)  . Atrial fibrillation ([place]) 06/2011   S/p left sided MAZE. On Coumadin, January, 2013  . Benign prostatic hypertrophy    but stable  . Calculus of kidney   . Chronic anticoagulation   . Chronic kidney disease 12/2010   with a creatinine on discharge of 1.84, was  2.1 on admission  . Chronic kidney disease (CKD), stage III (moderate)    Archie Endo 08/11/2014  . Coronary artery disease 06/2011   S/p LIMA-LAD, SVG-intermediate coronary (?), and SVG-distal RCA with concurrent MV repair and MAZE  . Dementia ([place])   . Ejection fraction < 50%    EF previously 50%, however 20-25% by echo and catheterization before CABG  . H/O hiatal hernia   . High cholesterol   . Hypertension   . Mitral regurgitation 06/2011   Mitral valve ring annuloplasty at the time of CABG,, January, 2013  . Myocardial infarction [facility])    "I believe I've had a heart attack; don't remember when" (3/92016)  . Shortness of breath   . Syncope    Near-syncope, etiology is deemed secondary to the excessive heat    Assessment: 89 YOM with a hx of Afib presenting with  altered mental status. Patient was on warfarin PTA. Pharmacy asked to restart warfarin.  INR today therapeutic. Patient has not received any doses since admit on 10/9, last dose PTA was 10/7 per patient's wife. Hgb has been stable at 12.2 today, platelets down-trending at 131. No overt bleeding noted. Will restart home dose and check INR tomorrow.  Also starting Fluconazole today for fungal sinusitis which can increase INR. Will follow closely.  PTA Warfarin regimen: 3 mg daily. Last dose 10/7.  Goal of Therapy:  INR 2-3 Monitor platelets by anticoagulation protocol: Yes   Plan:  Warfarin 3 mg PO x1 tonight Daily INR Monitor s/sx bleeding  Richardine Service, PharmD PGY1 Pharmacy Resident Phone: [phone] 03/15/2019  1:27 PM  Please check [email or web address] for unit-specific pharmacy phone numbers.

## 2019-03-15 NOTE — Consult Note (Signed)
Reason for Consult: Chronic sinusitis  HPI:  Troy Gregory is an 83 y.o. male who was admitted for evaluation and treatment of his altered mental status. The patient has a history of dementia and multiple other medical problems, including atrial fibrillation, chronic kidney disease stage III, coronary artery disease status post coronary artery bypass grafting, and diastolic dysfunction CHF.  The patient has been weak and lethargic all day.  He is usually able to move around and does his ADLs without difficulty.  He denies any chest pain, facial pain, no fever no chills.  Not sure of any sick contacts. His wife is unaware of any significant sinusitis. He was noted to have significant leukocytosis.  CT head showed left sided sinus opacification, suggestive of fungal sinusitis.    Past Medical History:  Diagnosis Date  . Arthritis    "a little in my joints" (08/11/2014)  . Atrial fibrillation (HCC) 06/2011   S/p left sided MAZE. On Coumadin, January, 2013  . Benign prostatic hypertrophy    but stable  . Calculus of kidney   . Chronic anticoagulation   . Chronic kidney disease 12/2010   with a creatinine on discharge of 1.84, was  2.1 on admission  . Chronic kidney disease (CKD), stage III (moderate)    [name] 08/11/2014  . Coronary artery disease 06/2011   S/p LIMA-LAD, SVG-intermediate coronary (?), and SVG-distal RCA with concurrent MV repair and MAZE  . Dementia (HCC)   . Ejection fraction < 50%    EF previously 50%, however 20-25% by echo and catheterization before CABG  . H/O hiatal hernia   . High cholesterol   . Hypertension   . Mitral regurgitation 06/2011   Mitral valve ring annuloplasty at the time of CABG,, January, 2013  . Myocardial infarction [facility])    "I believe I've had a heart attack; don't remember when" (3/92016)  . Shortness of breath   . Syncope    Near-syncope, etiology is deemed secondary to the excessive heat    Past Surgical History:  Procedure Laterality Date  .  CIRCUMCISION  08/12/2002   because of phimosis  . CORONARY ARTERY BYPASS GRAFT  06/06/2011    grafts times three using left internal mammary artery and right leg greater saphenous vein harvested endoscopically  . CYSTOSCOPY  02/13/2012   Procedure: CYSTOSCOPY;  Surgeon: Troy Cage, MD;  Location: [facility] ORS;  Service: Urology;  Laterality: N/A;  . LITHOTRIPSY    . MAZE  06/06/2011   Procedure: MAZE;  Surgeon: [name], MD;  Location[facility] OR;  Service: Open Heart Surgery;  Laterality: N/A;  . MITRAL VALVE REPAIR  06/06/2011   Procedure: MITRAL VALVE REPAIR (MVR);  Surgeon: [name], MD;  Location: [facility] OR;  Service: Open Heart Surgery;  Laterality: N/A;  . SATURATION BIOPSY OF PROSTATE    . TONSILLECTOMY    . TRANSURETHRAL RESECTION OF PROSTATE  02/13/2012   Procedure: TRANSURETHRAL RESECTION OF THE PROSTATE WITH GYRUS INSTRUMENTS;  Surgeon: Troy Cage, MD;  Location: [facility]S;  Service: Urology;  Laterality: N/A;  . TRANSURETHRAL RESECTION OF PROSTATE     hx/notes 08/11/2014    Family History  Problem Relation Age of Onset  . Cancer Father   . Lung cancer Father   . Diabetes Father   . Hypertension Mother   . Hypertension Brother     Social History:  reports that he quit smoking about 54 years ago. His smoking use included cigarettes and cigars. He  has a 45.00 pack-year smoking history. He has quit using smokeless tobacco.  His smokeless tobacco use included chew. He reports current alcohol use. He reports that he does not use drugs.  Allergies: No Known Allergies  Prior to Admission medications   Medication Sig Start Date End Date Taking? Authorizing Provider  amLODipine (NORVASC) 5 MG tablet Take 5 mg by mouth daily.  08/13/13  Yes [provider]  atorvastatin (LIPITOR) 40 MG tablet TAKE 1 TABLET BY MOUTH DAILY AFTER BREAKFAST Patient taking differently: Take 40 mg by mouth daily at 6 PM.  03/25/15  Yes Martinique, Peter M, MD  carvedilol (COREG) 12.5  MG tablet Take 12.5 mg by mouth daily.   Yes [provider]  carvedilol (COREG) 3.125 MG tablet Take 1 tablet (3.125 mg total) by mouth 2 (two) times daily with a meal. NEED OV. Patient taking differently: Take 12.5 mg by mouth daily. NEED OV. 03/28/18  Yes Martinique, Peter M, MD  donepezil (ARICEPT) 10 MG tablet Take 10 mg by mouth at bedtime.    Yes [provider]  finasteride (PROSCAR) 5 MG tablet Take 5 mg by mouth daily.  08/07/13  Yes [provider]  lisinopril (PRINIVIL,ZESTRIL) 20 MG tablet Take 20 mg by mouth daily.  02/25/12  Yes Josetta Huddle, MD  sertraline (ZOLOFT) 50 MG tablet Take 50 mg by mouth at bedtime. 12/01/18  Yes [provider]  warfarin (COUMADIN) 3 MG tablet Take as directed by coumadin clinic Patient taking differently: Take 3 mg by mouth daily.  04/21/12  Yes Martinique, Peter M, MD  nitrofurantoin, macrocrystal-monohydrate, (MACROBID) 100 MG capsule Take 1 capsule (100 mg total) by mouth 2 (two) times daily. Patient not taking: Reported on 03/14/2019 08/12/14   Robbie Lis, MD  potassium chloride SA (K-DUR,KLOR-CON) 20 MEQ tablet TAKE 1/2 TABLET BY MOUTH TWICE DAILY Patient not taking: Reported on 03/14/2019 08/08/17   Martinique, Peter M, MD     Results for orders placed or performed during the hospital encounter of 03/13/19 (from the past 48 hour(s))  Urine culture     Status: Abnormal (Preliminary result)   Collection Time: 03/13/19  9:03 PM   Specimen: Urine, Random  Result Value Ref Range   Specimen Description URINE, RANDOM    Special Requests NONE    Culture (A)     >=100,000 COLONIES/mL ESCHERICHIA COLI SUSCEPTIBILITIES TO FOLLOW Performed at [facility], [address], [place], [place] [address]    Report Status PENDING   CBC with Differential     Status: Abnormal   Collection Time: 03/13/19  9:03 PM  Result Value Ref Range   WBC 19.1 (H) 4.0 - 10.5 K/uL   RBC 4.24 4.22 - 5.81 MIL/uL   Hemoglobin 12.8 (L) 13.0 -  17.0 g/dL   HCT 40.0 39.0 - 52.0 %   MCV 94.3 80.0 - 100.0 fL   MCH 30.2 26.0 - 34.0 pg   MCHC 32.0 30.0 - 36.0 g/dL   RDW 13.1 11.5 - 15.5 %   Platelets 167 150 - 400 K/uL   nRBC 0.0 0.0 - 0.2 %   Neutrophils Relative % 88 %   Neutro Abs 16.7 (H) 1.7 - 7.7 K/uL   Lymphocytes Relative 3 %   Lymphs Abs 0.7 0.7 - 4.0 K/uL   Monocytes Relative 8 %   Monocytes Absolute 1.5 (H) 0.1 - 1.0 K/uL   Eosinophils Relative 0 %   Eosinophils Absolute 0.0 0.0 - 0.5 K/uL  Basophils Relative 0 %   Basophils Absolute 0.1 0.0 - 0.1 K/uL   Immature Granulocytes 1 %   Abs Immature Granulocytes 0.22 (H) 0.00 - 0.07 K/uL    Comment: Performed at [facility], [address] [place], [place] [address]  CBG monitoring, ED     Status: Abnormal   Collection Time: 03/13/19  9:59 PM  Result Value Ref Range   Glucose-Capillary 152 (H) 70 - 99 mg/dL  Comprehensive metabolic panel     Status: Abnormal   Collection Time: 03/13/19 11:46 PM  Result Value Ref Range   Sodium 143 135 - 145 mmol/L   Potassium 4.5 3.5 - 5.1 mmol/L   Chloride 105 98 - 111 mmol/L   CO2 26 22 - 32 mmol/L   Glucose, Bld 157 (H) 70 - 99 mg/dL   BUN 21 8 - 23 mg/dL   Creatinine, Ser 1.912.29 (H) 0.61 - 1.24 mg/dL   Calcium 8.9 8.9 - 47.810.3 mg/dL   Total Protein 6.5 6.5 - 8.1 g/dL   Albumin 3.2 (L) 3.5 - 5.0 g/dL   AST 34 15 - 41 U/L   ALT 19 0 - 44 U/L   Alkaline Phosphatase 73 38 - 126 U/L   Total Bilirubin 1.0 0.3 - 1.2 mg/dL   GFR calc non Af Amer 26 (L) >60 mL/min   GFR calc Af Amer 30 (L) >60 mL/min   Anion gap 12 5 - 15    Comment: Performed at [facility], [address] [place], [place] [address]  SARS CORONAVIRUS 2 (TAT 6-24 HRS) Nasopharyngeal Nasopharyngeal Swab     Status: None   Collection Time: 03/14/19 12:14 AM   Specimen: Nasopharyngeal Swab  Result Value Ref Range   SARS Coronavirus 2 NEGATIVE NEGATIVE    Comment: (NOTE) SARS-CoV-2 target nucleic acids are NOT DETECTED. The SARS-CoV-2 RNA is  generally detectable in upper and lower respiratory specimens during the acute phase of infection. Negative results do not preclude SARS-CoV-2 infection, do not rule out co-infections with other pathogens, and should not be used as the sole basis for treatment or other patient management decisions. Negative results must be combined with clinical observations, patient history, and epidemiological information. The expected result is Negative. Fact Sheet for Patients: HairSlick.nohttps://www.fda.gov/media/138098/download Fact Sheet for Healthcare Providers: quierodirigir.comhttps://www.fda.gov/media/138095/download This test is not yet approved or cleared by the [place] FDA and  has been authorized for detection and/or diagnosis of SARS-CoV-2 by FDA under an Emergency Use Authorization (EUA). This EUA will remain  in effect (meaning this test can be used) for the duration of the COVID-19 declaration under Section 56 4(b)(1) of the Act, 21 U.S.C. section 360bbb-3(b)(1), unless the authorization is terminated or revoked sooner. Performed at [facility], [address] [place], [place] [address]   Comprehensive metabolic panel     Status: Abnormal   Collection Time: 03/14/19  3:50 AM  Result Value Ref Range   Sodium 141 135 - 145 mmol/L   Potassium 4.1 3.5 - 5.1 mmol/L   Chloride 105 98 - 111 mmol/L   CO2 23 22 - 32 mmol/L   Glucose, Bld 157 (H) 70 - 99 mg/dL   BUN 22 8 - 23 mg/dL   Creatinine, Ser 5.782.09 (H) 0.61 - 1.24 mg/dL   Calcium 9.0 8.9 - 46.910.3 mg/dL   Total Protein 6.7 6.5 - 8.1 g/dL   Albumin 3.0 (L) 3.5 - 5.0 g/dL   AST 29 15 - 41 U/L  ALT 17 0 - 44 U/L   Alkaline Phosphatase 68 38 - 126 U/L   Total Bilirubin 1.3 (H) 0.3 - 1.2 mg/dL   GFR calc non Af Amer 29 (L) >60 mL/min   GFR calc Af Amer 33 (L) >60 mL/min   Anion gap 13 5 - 15    Comment: Performed at [facility], [address], [place], [place] [address]  CBC     Status: Abnormal   Collection Time: 03/14/19  3:50 AM   Result Value Ref Range   WBC 17.4 (H) 4.0 - 10.5 K/uL   RBC 4.02 (L) 4.22 - 5.81 MIL/uL   Hemoglobin 12.4 (L) 13.0 - 17.0 g/dL   HCT 60.4 (L) 54.0 - 98.1 %   MCV 94.8 80.0 - 100.0 fL   MCH 30.8 26.0 - 34.0 pg   MCHC 32.5 30.0 - 36.0 g/dL   RDW 19.1 47.8 - 29.5 %   Platelets 136 (L) 150 - 400 K/uL    Comment: REPEATED TO VERIFY   nRBC 0.0 0.0 - 0.2 %    Comment: Performed at [facility], [address] [place], [place] [address]  TSH     Status: None   Collection Time: 03/14/19  3:51 AM  Result Value Ref Range   TSH 1.429 0.350 - 4.500 uIU/mL    Comment: Performed by a 3rd Generation assay with a functional sensitivity of <=0.01 uIU/mL. Performed at [facility], [address] [place], [place] [address]   Urinalysis, Routine w reflex microscopic     Status: Abnormal   Collection Time: 03/14/19  8:50 AM  Result Value Ref Range   Color, Urine AMBER (A) YELLOW   APPearance TURBID (A) CLEAR   Specific Gravity, Urine 1.018 1.005 - 1.030   pH 6.0 5.0 - 8.0   Glucose, UA NEGATIVE NEGATIVE mg/dL   Hgb urine dipstick LARGE (A) NEGATIVE   Bilirubin Urine NEGATIVE NEGATIVE   Ketones, ur NEGATIVE NEGATIVE mg/dL   Protein, ur 469 (A) NEGATIVE mg/dL   Nitrite NEGATIVE NEGATIVE   Leukocytes,Ua MODERATE (A) NEGATIVE   RBC / HPF >50 (H) 0 - 5 RBC/hpf   WBC, UA >50 (H) 0 - 5 WBC/hpf   Bacteria, UA MANY (A) NONE SEEN   Mucus PRESENT    Sperm, UA PRESENT     Comment: Performed at [facility], [address], [place], [place] [address]  Protime-INR     Status: Abnormal   Collection Time: 03/15/19 11:58 AM  Result Value Ref Range   Prothrombin Time 22.4 (H) 11.4 - 15.2 seconds   INR 2.0 (H) 0.8 - 1.2    Comment: (NOTE) INR goal varies based on device and disease states. Performed at [facility], [address] [place], [place] [address]   CBC     Status: Abnormal   Collection Time: 03/15/19 11:58 AM  Result Value Ref Range   WBC 14.1 (H) 4.0 - 10.5 K/uL    RBC 3.92 (L) 4.22 - 5.81 MIL/uL   Hemoglobin 12.2 (L) 13.0 - 17.0 g/dL   HCT 44.0 (L) 10.2 - 72.5 %   MCV 91.3 80.0 - 100.0 fL   MCH 31.1 26.0 - 34.0 pg   MCHC 34.1 30.0 - 36.0 g/dL   RDW 36.6 44.0 - 34.7 %   Platelets 131 (L) 150 - 400 K/uL   nRBC 0.0 0.0 - 0.2 %    Comment: Performed at [facility], [address], [place], [place] [address]  Comprehensive metabolic panel     Status: Abnormal   Collection Time: 03/15/19 11:58 AM  Result Value Ref Range   Sodium 138 135 - 145 mmol/L   Potassium 3.9 3.5 - 5.1 mmol/L   Chloride 106 98 - 111 mmol/L   CO2 24 22 - 32 mmol/L   Glucose, Bld 130 (H) 70 - 99 mg/dL   BUN 26 (H) 8 - 23 mg/dL   Creatinine, Ser 4.03 (H) 0.61 - 1.24 mg/dL   Calcium 9.0 8.9 - 47.4 mg/dL   Total Protein 6.2 (L) 6.5 - 8.1 g/dL   Albumin 2.8 (L) 3.5 - 5.0 g/dL   AST 66 (H) 15 - 41 U/L   ALT 28 0 - 44 U/L   Alkaline Phosphatase 76 38 - 126 U/L   Total Bilirubin 0.8 0.3 - 1.2 mg/dL   GFR calc non Af Amer 35 (L) >60 mL/min   GFR calc Af Amer 41 (L) >60 mL/min   Anion gap 8 5 - 15    Comment: Performed at [facility], [address], [place], [place] [address]  CK     Status: Abnormal   Collection Time: 03/15/19 11:58 AM  Result Value Ref Range   Total CK 783 (H) 49 - 397 U/L    Comment: Performed at [facility], [address] [place], [place] [address]    Ct Head Wo Contrast  Result Date: 03/13/2019 CLINICAL DATA:  Altered level of consciousness, increasing lethargy, confusion and weakness EXAM: CT HEAD WITHOUT CONTRAST TECHNIQUE: Contiguous axial images were obtained from the base of the skull through the vertex without intravenous contrast. COMPARISON:  CT head March 17, 2017 FINDINGS: Brain: No evidence of acute infarction, hemorrhage, hydrocephalus, extra-axial collection or mass lesion/mass effect. Symmetric prominence of the ventricles, cisterns and sulci compatible with parenchymal volume loss. Pattern of volume loss appears frontal  predominant. Patchy areas of white matter hypoattenuation are most compatible with chronic microvascular angiopathy. Vascular: Atherosclerotic calcification of the carotid siphons and intradural vertebral arteries. No hyperdense vessel. Skull: No calvarial fracture or suspicious osseous lesion. No scalp swelling or hematoma. Sinuses/Orbits: There is nodular hypodense mural thickening throughout the left maxillary sinus, left ethmoids and left frontal sinuses with more central hyperattenuation. Slightly expansile appearance of this sinonasal thickening with some thinning of the left maxillary antrum. No abnormal stranding or thickening in the retroantral fat. Poor dentition with a periapical lucency of the left maxillary first molar. Included orbital structures are unremarkable. Other: None IMPRESSION: 1. No acute intracranial abnormality. 2. Chronic microvascular ischemic changes as well as frontal predominant parenchymal volume loss, and intracranial atherosclerosis. 3. Nodular hypodense mural thickening throughout the left maxillary sinus, left ethmoids and left frontal sinuses with more central hyperattenuation. Worsening since comparison exam. 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 Slightly expansile appearance of this sinonasal thickening with some thinning of the left maxillary antrum, therefore mucocele is not fully excluded. 4. Poor dentition with a periapical lucency of the left maxillary first molar. Correlate with dental examination. Electronically Signed   By: Kreg Shropshire M.D.   On: 03/13/2019 23:21   Dg Chest Portable 1 View  Result Date: 03/13/2019 CLINICAL DATA:  Pt brought to ED by [facility] from home live with the wife. Per family pt is been moore lethargic, confuse and weak all day today. Hx of dementia, but mostly able to walk around and take  care of himself. EXAM: PORTABLE CHEST 1 VIEW COMPARISON:  Chest radiograph 08/11/2014 FINDINGS:  Stable cardiomediastinal contours status post median sternotomy and CABG. Low lung volumes with bronchovascular crowding and scattered linear opacities likely reflecting atelectasis. No focal infiltrate to suggest infection. No pneumothorax or large pleural effusion. Degenerative changes are seen in the right shoulder. IMPRESSION: Low lung volumes with bronchovascular crowding and probable scattered atelectasis. No focal infiltrate to suggest infection. Electronically Signed   By: Emmaline Kluver M.D.   On: 03/13/2019 21:27   Review of Systems  Unable to perform ROS: Dementia and AMS  Blood pressure (!) 144/68, pulse (!) 50, temperature (!) 97.3 F (36.3 C), temperature source Oral, resp. rate 18, height  (1.854 m), weight 78.9 kg, SpO2 100 %. Physical Exam Vitals signs and nursing note reviewed.  Constitutional:   He is not in acute distress. He is well-developed. Awake but not oriented. Head: Normocephalic and atraumatic.  No Battle's signs, no raccoon's eyes, no rhinorrhea. No hemotympanum. No tenderness to palpation of the face or skull. No deformity, crepitus, or swelling noted. Eyes: His pupils are equal, round, reactive to light. Extraocular motion is intact.  Ears: Examination of the ears shows normal auricles and external auditory canals bilaterally. Both tympanic membranes are intact.  Nose: Nasal examination shows congested mucosa, septum, turbinates.  Face: Facial examination shows no asymmetry. Palpation of the face elicit no significant tenderness.  Mouth: Oral cavity examination shows no mucosal lacerations. No significant trismus is noted.  Neck: Palpation of the neck reveals no lymphadenopathy or mass. The trachea is midline. The thyroid is not significantly enlarged.  Cardiovascular: Normal rate and regular rhythm.  Pulmonary:  Pulmonary effort is normal.  Skin: Skin is warm and dry.  No erythema.   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 The sinus disease was also present on his 2018 CT scan. This is unlikely the cause of his current altered mental status. - Fluconazole for 10 days. - Pt will likely need to undergo endoscopic sinus surgery to remove the fungal sinusitis. - This can be performed as an outpatient. - Contact information for my office is given to the wife.  Klyn Kroening W Lilyauna Miedema 03/15/2019, 7:50 PM

## 2019-03-15 NOTE — Progress Notes (Signed)
Pharmacy Antibiotic Note  Troy Gregory is a 83 y.o. male admitted on 03/13/2019 with fungal sinusitis.  Pharmacy has been consulted for Fluconazole dosing.   WBC elevated but trending down at 14.1, remains afebrile. Renal function improving - Scr elevated at 1.76 but trending down, making minimal urine.  Plan: Fluconazole 50 mg PO Q24 hrs Monitor renal function, clinical progression F/u LOT  Height: 6\' 1"  (185.4 cm) Weight: 174 lb (78.9 kg) IBW/kg (Calculated) : 79.9  Temp (24hrs), Avg:97.9 F (36.6 C), Min:97.6 F (36.4 C), Max:98.3 F (36.8 C)  Recent Labs  Lab 03/13/19 2103 03/13/19 2346 03/14/19 0350 03/15/19 1158  WBC 19.1*  --  17.4* 14.1*  CREATININE  --  2.29* 2.09* 1.76*    Estimated Creatinine Clearance: 36.7 mL/min (A) (by C-G formula based on SCr of 1.76 mg/dL (H)).    No Known Allergies  Antimicrobials this admission: Fluconazole 10/11 >>   Microbiology results: 10/9 UCx: sent   Richardine Service, PharmD PGY1 Pharmacy Resident Phone: [phone] 03/15/2019  1:41 PM  Please check [email or web address] for unit-specific pharmacy phone numbers.

## 2019-03-15 NOTE — Progress Notes (Signed)
PROGRESS NOTE    Troy Gregory  ZOX:[id number] DOB: Mar 12, 1936 DOA: 03/13/2019 PCP: Marden Noble, MD   Brief Narrative: 83 y.o. male with medical history significant of dementia, atrial fibrillation, chronic kidney disease stage III, coronary artery disease status post coronary artery bypass grafting, diastolic dysfunction CHF, history of maze procedure in 2013 who presents to the ER with confusion and generalized weakness.  Patient has been weak and lethargic all day.  He is usually able to move around and does all his ADLs without difficulty.  He is got history of dementia but still communicating adequately.  Patient communicating even now and denying any chest pain, no fever no chills.  Not sure of any sick contacts.  Patient is oriented in person only but denies any dysuria.  He was noted to have significant leukocytosis.  CT head showed possible sinusitis.  No evidence of UTI or pneumonia.  Patient is being admitted with weakness and possibly some occult infection.  ED Course: Temperature is 98.9 blood pressure 153/119 pulse 67 respiratory of 23 oxygen sat 91% on room air.  White count is 19.1 hemoglobin 12.8 otherwise CBC within normal.  Chemistry showed glucose 157 and creatinine 2.29 albumin 3.2 the rest appears to be within normal.  COVID-19 screen is pending.  Patient being admitted with altered mental status confusion probably due to dementia.  03/15/2019 patient remains confused trying to climb out of bed does not follow commands or answer questions appropriately did not eat anything yesterday remains on IV fluids  Assessment & Plan:   Principal Problem:   AMS (altered mental status) Active Problems:   PAF (paroxysmal atrial fibrillation) (HCC)   Hx of CABG 2013   Memory loss   CKD (chronic kidney disease) stage 3, GFR 30-59 ml/min   Chronic combined systolic and diastolic CHF-EF 81% Aug 2014   Pressure injury of skin  #1 acute metabolic encephalopathy in the setting of  dementia-patient admitted with increasing confusion and generalized weakness and decreased p.o. intake prior to admission to hospital.  Work-up so far shows a positive UA with urine culture pending. patient continues with no p.o. intake.  He has been on IV fluids since the time he was admitted.  Upon discussion with his son he reported that he gets quite a few urinary tract infections.  I will give him 1 dose of Rocephin with a positive UA and follow-up urine culture to see if he improves and be back to baseline.  According to his son his baseline is he normally walks around by himself and does all his ADLs. Continue IV fluids as patient is not eating anything.  CT of the head showed no acute abnormality chronic microvascular ischemic changes noted with parenchymal volume loss and intracranial atherosclerosis.  Concern for fungal allergic fungal sinusitis or sinonasal polyposis.  Left maxillary left ethmoid left frontal sinus cyst with mural thickening worse since comparison exam.  With Dr. Suszanne Conners ENT will start patient on fluconazole and he will see the patient. Consult palliative care.  #2 paroxysmal atrial fibrillation by history in normal sinus rhythm now heart rate in high 50s patient on Coreg 12.5 mg daily at home which is on hold.  Restart Coumadin.  #3 diastolic dysfunction stable patient is more on the dry side  #4 history of CAD and CABG patient is on Lipitor Norvasc Coreg lisinopril Coumadin at home.  Restart Coumadin with history of paroxysmal A. fib.   #5 history of hypertension stable.  He takes Norvasc 5 mg  daily at home with Coreg 12.5 mg daily with lisinopril 20 mg daily B cell have been on hold as patient is more dehydrated with a normal to soft blood pressure.  #6 history of dementia on Aricept at home  #7 depression on Zoloft at home restart Zoloft  #8 BPH continue Proscar   Pressure Ulcer 06/29/11 Stage II -  Partial thickness loss of dermis presenting as a shallow open  ulcer with a red, pink wound bed without slough. (Active)  06/29/11 2041  Location: Coccyx  Location Orientation:   Staging: Stage II -  Partial thickness loss of dermis presenting as a shallow open ulcer with a red, pink wound bed without slough.  Wound Description (Comments):   Present on Admission:      Pressure Injury 03/14/19 Coccyx Stage II -  Partial thickness loss of dermis presenting as a shallow open ulcer with a red, pink wound bed without slough. (Active)  03/14/19 2100  Location: Coccyx  Location Orientation:   Staging: Stage II -  Partial thickness loss of dermis presenting as a shallow open ulcer with a red, pink wound bed without slough.  Wound Description (Comments):   Present on Admission: Yes       Estimated body mass index is 22.96 kg/m as calculated from the following:   Height as of 11/16/15: 6\' 1"  (1.854 m).   Weight as of this encounter: 78.9 kg.  DVT prophylaxis: Heparin Code Status: Full code Family Communication: No family at bedside called patient's wife no response then I called patient's son discussed with him.   Disposition Plan: PEnding clinical improvement might need SNF PT eval pending Consultants:   Palliative care  Procedures: None Antimicrobials: Rocephin x1 dose 03/15/2019 Subjective:  Confused trying to get out of bed awake not oriented does not answer any questions appropriately Objective: Vitals:   03/14/19 2049 03/15/19 0039 03/15/19 0506 03/15/19 0935  BP: 111/72 (!) 144/93 (!) 147/78 (!) 135/105  Pulse: 61 (!) 59 (!) 57 75  Resp: 19 18 18 18   Temp: 98 F (36.7 C) 98.3 F (36.8 C)  97.6 F (36.4 C)  TempSrc: Oral Oral  Oral  SpO2: 98% 96%  96%  Weight:  78.9 kg      Intake/Output Summary (Last 24 hours) at 03/15/2019 1154 Last data filed at 03/15/2019 0444 Gross per 24 hour  Intake 1846.29 ml  Output 100 ml  Net 1746.29 ml   Filed Weights   03/15/19 0039  Weight: 78.9 kg    Examination:  General exam: Frail  elderly male trying to climb out of bed respiratory system: Clear to auscultation. Respiratory effort normal. Cardiovascular system: S1 & S2 heard, RRR. No JVD, murmurs, rubs, gallops or clicks. No pedal edema. Gastrointestinal system: Abdomen is nondistended, soft and nontender. No organomegaly or masses felt. Normal bowel sounds heard. Central nervous system: Confused and awake extremities: No edema  skin pressure injury as above Data Reviewed: I have personally reviewed following labs and imaging studies  CBC: Recent Labs  Lab 03/13/19 2103 03/14/19 0350  WBC 19.1* 17.4*  NEUTROABS 16.7*  --   HGB 12.8* 12.4*  HCT 40.0 38.1*  MCV 94.3 94.8  PLT 167 136*   Basic Metabolic Panel: Recent Labs  Lab 03/13/19 2346 03/14/19 0350  NA 143 141  K 4.5 4.1  CL 105 105  CO2 26 23  GLUCOSE 157* 157*  BUN 21 22  CREATININE 2.29* 2.09*  CALCIUM 8.9 9.0   GFR: CrCl cannot be  calculated (Unknown ideal weight.). Liver Function Tests: Recent Labs  Lab 03/13/19 2346 03/14/19 0350  AST 34 29  ALT 19 17  ALKPHOS 73 68  BILITOT 1.0 1.3*  PROT 6.5 6.7  ALBUMIN 3.2* 3.0*   No results for input(s): LIPASE, AMYLASE in the last 168 hours. No results for input(s): AMMONIA in the last 168 hours. Coagulation Profile: No results for input(s): INR, PROTIME in the last 168 hours. Cardiac Enzymes: No results for input(s): CKTOTAL, CKMB, CKMBINDEX, TROPONINI in the last 168 hours. BNP (last 3 results) No results for input(s): PROBNP in the last 8760 hours. HbA1C: No results for input(s): HGBA1C in the last 72 hours. CBG: Recent Labs  Lab 03/13/19 2159  GLUCAP 152*   Lipid Profile: No results for input(s): CHOL, HDL, LDLCALC, TRIG, CHOLHDL, LDLDIRECT in the last 72 hours. Thyroid Function Tests: Recent Labs    03/14/19 0351  TSH 1.429   Anemia Panel: No results for input(s): VITAMINB12, FOLATE, FERRITIN, TIBC, IRON, RETICCTPCT in the last 72 hours. Sepsis Labs: No results for  input(s): PROCALCITON, LATICACIDVEN in the last 168 hours.  Recent Results (from the past 240 hour(s))  SARS CORONAVIRUS 2 (TAT 6-24 HRS) Nasopharyngeal Nasopharyngeal Swab     Status: None   Collection Time: 03/14/19 12:14 AM   Specimen: Nasopharyngeal Swab  Result Value Ref Range Status   SARS Coronavirus 2 NEGATIVE NEGATIVE Final    Comment: (NOTE) SARS-CoV-2 target nucleic acids are NOT DETECTED. The SARS-CoV-2 RNA is generally detectable in upper and lower respiratory specimens during the acute phase of infection. Negative results do not preclude SARS-CoV-2 infection, do not rule out co-infections with other pathogens, and should not be used as the sole basis for treatment or other patient management decisions. Negative results must be combined with clinical observations, patient history, and epidemiological information. The expected result is Negative. Fact Sheet for Patients: HairSlick.nohttps://www.fda.gov/media/138098/download Fact Sheet for Healthcare Providers: quierodirigir.comhttps://www.fda.gov/media/138095/download This test is not yet approved or cleared by the [place] FDA and  has been authorized for detection and/or diagnosis of SARS-CoV-2 by FDA under an Emergency Use Authorization (EUA). This EUA will remain  in effect (meaning this test can be used) for the duration of the COVID-19 declaration under Section 56 4(b)(1) of the Act, 21 U.S.C. section 360bbb-3(b)(1), unless the authorization is terminated or revoked sooner. Performed at [facility], [address] [place], [place] [address]          Radiology Studies: Ct Head Wo Contrast  Result Date: 03/13/2019 CLINICAL DATA:  Altered level of consciousness, increasing lethargy, confusion and weakness EXAM: CT HEAD WITHOUT CONTRAST TECHNIQUE: Contiguous axial images were obtained from the base of the skull through the vertex without intravenous contrast. COMPARISON:  CT head 04/23/2017 FINDINGS: Brain: No evidence of  acute infarction, hemorrhage, hydrocephalus, extra-axial collection or mass lesion/mass effect. Symmetric prominence of the ventricles, cisterns and sulci compatible with parenchymal volume loss. Pattern of volume loss appears frontal predominant. Patchy areas of white matter hypoattenuation are most compatible with chronic microvascular angiopathy. Vascular: Atherosclerotic calcification of the carotid siphons and intradural vertebral arteries. No hyperdense vessel. Skull: No calvarial fracture or suspicious osseous lesion. No scalp swelling or hematoma. Sinuses/Orbits: There is nodular hypodense mural thickening throughout the left maxillary sinus, left ethmoids and left frontal sinuses with more central hyperattenuation. Slightly expansile appearance of this sinonasal thickening with some thinning of the left maxillary antrum. No abnormal stranding or thickening in the retroantral fat. Poor dentition with a periapical  lucency of the left maxillary first molar. Included orbital structures are unremarkable. Other: None IMPRESSION: 1. No acute intracranial abnormality. 2. Chronic microvascular ischemic changes as well as frontal predominant parenchymal volume loss, and intracranial atherosclerosis. 3. Nodular hypodense mural thickening throughout the left maxillary sinus, left ethmoids and left frontal sinuses with more central hyperattenuation. Worsening since comparison exam. 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 Slightly expansile appearance of this sinonasal thickening with some thinning of the left maxillary antrum, therefore mucocele is not fully excluded. 4. Poor dentition with a periapical lucency of the left maxillary first molar. Correlate with dental examination. Electronically Signed   By: Lovena Le M.D.   On: 03/13/2019 23:21   Dg Chest Portable 1 View  Result Date: 03/13/2019 CLINICAL DATA:  Pt brought to ED by [facility] from home live with  the wife. Per family pt is been moore lethargic, confuse and weak all day today. Hx of dementia, but mostly able to walk around and take care of himself. EXAM: PORTABLE CHEST 1 VIEW COMPARISON:  Chest radiograph 08/11/2014 FINDINGS: Stable cardiomediastinal contours status post median sternotomy and CABG. Low lung volumes with bronchovascular crowding and scattered linear opacities likely reflecting atelectasis. No focal infiltrate to suggest infection. No pneumothorax or large pleural effusion. Degenerative changes are seen in the right shoulder. IMPRESSION: Low lung volumes with bronchovascular crowding and probable scattered atelectasis. No focal infiltrate to suggest infection. Electronically Signed   By: Audie Pinto M.D.   On: 03/13/2019 21:27        Scheduled Meds: . heparin  5,000 Units Subcutaneous Q8H   Continuous Infusions: . dextrose 5 % and 0.45% NaCl 75 mL/hr at 03/14/19 1619     LOS: 1 day     Georgette Shell, MD [facility]  If 7PM-7AM, please contact night-coverage [email or web address] Password [email or web address] 03/15/2019, 11:54 AM

## 2019-03-15 NOTE — Plan of Care (Signed)

## 2019-03-16 DIAGNOSIS — Z515 Encounter for palliative care: Secondary | ICD-10-CM

## 2019-03-16 DIAGNOSIS — R531 Weakness: Secondary | ICD-10-CM

## 2019-03-16 DIAGNOSIS — Z66 Do not resuscitate: Secondary | ICD-10-CM

## 2019-03-16 LAB — CBC WITH DIFFERENTIAL/PLATELET
Abs Immature Granulocytes: 0.03 10*3/uL (ref 0.00–0.07)
Basophils Absolute: 0 10*3/uL (ref 0.0–0.1)
Basophils Relative: 0 %
Eosinophils Absolute: 0.1 10*3/uL (ref 0.0–0.5)
Eosinophils Relative: 1 %
HCT: 35.2 % — ABNORMAL LOW (ref 39.0–52.0)
Hemoglobin: 11.7 g/dL — ABNORMAL LOW (ref 13.0–17.0)
Immature Granulocytes: 0 %
Lymphocytes Relative: 6 %
Lymphs Abs: 0.5 10*3/uL — ABNORMAL LOW (ref 0.7–4.0)
MCH: 30.4 pg (ref 26.0–34.0)
MCHC: 33.2 g/dL (ref 30.0–36.0)
MCV: 91.4 fL (ref 80.0–100.0)
Monocytes Absolute: 0.7 10*3/uL (ref 0.1–1.0)
Monocytes Relative: 8 %
Neutro Abs: 7.6 10*3/uL (ref 1.7–7.7)
Neutrophils Relative %: 85 %
Platelets: 164 10*3/uL (ref 150–400)
RBC: 3.85 MIL/uL — ABNORMAL LOW (ref 4.22–5.81)
RDW: 13.1 % (ref 11.5–15.5)
WBC: 9 10*3/uL (ref 4.0–10.5)
nRBC: 0 % (ref 0.0–0.2)

## 2019-03-16 LAB — COMPREHENSIVE METABOLIC PANEL
ALT: 42 U/L (ref 0–44)
AST: 73 U/L — ABNORMAL HIGH (ref 15–41)
Albumin: 2.6 g/dL — ABNORMAL LOW (ref 3.5–5.0)
Alkaline Phosphatase: 69 U/L (ref 38–126)
Anion gap: 9 (ref 5–15)
BUN: 20 mg/dL (ref 8–23)
CO2: 25 mmol/L (ref 22–32)
Calcium: 8.7 mg/dL — ABNORMAL LOW (ref 8.9–10.3)
Chloride: 105 mmol/L (ref 98–111)
Creatinine, Ser: 1.5 mg/dL — ABNORMAL HIGH (ref 0.61–1.24)
GFR calc Af Amer: 50 mL/min — ABNORMAL LOW (ref >60)
GFR calc non Af Amer: 43 mL/min — ABNORMAL LOW (ref >60)
Glucose, Bld: 113 mg/dL — ABNORMAL HIGH (ref 70–99)
Potassium: 3 mmol/L — ABNORMAL LOW (ref 3.5–5.1)
Sodium: 139 mmol/L (ref 135–145)
Total Bilirubin: 0.9 mg/dL (ref 0.3–1.2)
Total Protein: 6.9 g/dL (ref 6.5–8.1)

## 2019-03-16 LAB — URINE CULTURE: Culture: >=100000 — AB

## 2019-03-16 LAB — PROTIME-INR
INR: 2.2 — ABNORMAL HIGH (ref 0.8–1.2)
Prothrombin Time: 24.4 seconds — ABNORMAL HIGH (ref 11.4–15.2)

## 2019-03-16 LAB — MAGNESIUM: Magnesium: 1.6 mg/dL — ABNORMAL LOW (ref 1.7–2.4)

## 2019-03-16 MED ORDER — FLUCONAZOLE 200 MG PO TABS
200.0000 mg | ORAL_TABLET | Freq: Every day | ORAL | Status: DC
  Administered 2019-03-17 – 2019-03-19 (×3): 200 mg via ORAL
  Filled 2019-03-16 (×3): qty 1

## 2019-03-16 MED ORDER — POTASSIUM CHLORIDE CRYS ER 20 MEQ PO TBCR
40.0000 meq | EXTENDED_RELEASE_TABLET | Freq: Once | ORAL | Status: AC
  Administered 2019-03-16: 16:00:00 40 meq via ORAL
  Filled 2019-03-16: qty 2

## 2019-03-16 MED ORDER — MAGNESIUM SULFATE 2 GM/50ML IV SOLN
2.0000 g | Freq: Once | INTRAVENOUS | Status: AC
  Administered 2019-03-16: 2 g via INTRAVENOUS
  Filled 2019-03-16: qty 50

## 2019-03-16 MED ORDER — SODIUM CHLORIDE 0.9 % IV SOLN
1.0000 g | INTRAVENOUS | Status: DC
  Administered 2019-03-16 – 2019-03-18 (×3): 1 g via INTRAVENOUS
  Filled 2019-03-16 (×3): qty 10

## 2019-03-16 MED ORDER — ADULT MULTIVITAMIN W/MINERALS CH
1.0000 | ORAL_TABLET | Freq: Every day | ORAL | Status: DC
  Administered 2019-03-17 – 2019-03-18 (×2): 1 via ORAL
  Filled 2019-03-16 (×2): qty 1

## 2019-03-16 MED ORDER — POTASSIUM CHLORIDE CRYS ER 20 MEQ PO TBCR
40.0000 meq | EXTENDED_RELEASE_TABLET | Freq: Once | ORAL | Status: AC
  Administered 2019-03-16: 40 meq via ORAL
  Filled 2019-03-16: qty 2

## 2019-03-16 MED ORDER — WARFARIN SODIUM 3 MG PO TABS
3.0000 mg | ORAL_TABLET | Freq: Every day | ORAL | Status: DC
  Administered 2019-03-16 – 2019-03-17 (×2): 3 mg via ORAL
  Filled 2019-03-16 (×2): qty 1

## 2019-03-16 MED ORDER — FLUCONAZOLE 200 MG PO TABS: 200.0000 mg | ORAL_TABLET | Freq: Every day | ORAL | Status: DC

## 2019-03-16 MED ORDER — FLUCONAZOLE 150 MG PO TABS
150.0000 mg | ORAL_TABLET | ORAL | Status: AC
  Administered 2019-03-16: 12:00:00 150 mg via ORAL
  Filled 2019-03-16: qty 1

## 2019-03-16 NOTE — NC FL2 (Signed)
[place] LEVEL OF CARE SCREENING TOOL     IDENTIFICATION  Patient Name: Troy Gregory Birthdate: 1935/11/23 Sex: male Admission Date (Current Location): 03/13/2019  [facility] and [place] Number:  Herbalist and Address:  [facility] [place]. [facility], [place] [address], [place], [place] [address]      Provider Number: [id number]  Attending Physician Name and Address:  Georgette Shell, MD  Relative Name and Phone Number:  Arby Dahir [phone]    Current Level of Care: Hospital Recommended Level of Care: [place] Prior Approval Number:    Date Approved/Denied:   PASRR Number: [id number]  Discharge Plan: SNF    Current Diagnoses: Patient Active Problem List   Diagnosis Date Noted  . Generalized weakness   . Palliative care by specialist   . DNR (do not resuscitate)   . Pressure injury of skin 03/15/2019  . AMS (altered mental status) 03/14/2019  . Syncope and collapse 08/11/2014  . CKD (chronic kidney disease) stage 3, GFR 30-59 ml/min 08/11/2014  . Chronic combined systolic and diastolic CHF-EF 64% Aug 6803 08/11/2014  . Leukocytosis 08/11/2014  . PVC's (premature ventricular contractions)   . Memory loss 09/19/2011  . UTI (lower urinary tract infection) 07/01/2011  . H/O mitral valve repair-2013 06/05/2011  . Hx of CABG 2013 06/05/2011  . PAF (paroxysmal atrial fibrillation) ([place])   . Hypertension-hypotensive on admission 12/03/2010    Orientation RESPIRATION BLADDER Height & Weight     Self  Normal Incontinent, External catheter Weight: 84.4 kg Height:  6\' 1"  (185.4 cm)  BEHAVIORAL SYMPTOMS/MOOD NEUROLOGICAL BOWEL NUTRITION STATUS      Incontinent Diet(See DC summary)  AMBULATORY STATUS COMMUNICATION OF NEEDS Skin   Extensive Assist Verbally PU Stage and Appropriate Care(foam dressing)   PU Stage 2 Dressing: Daily                   Personal Care Assistance Level of Assistance  Bathing, Feeding,  Dressing, Total care Bathing Assistance: Maximum assistance Feeding assistance: Maximum assistance Dressing Assistance: Maximum assistance Total Care Assistance: Maximum assistance   Functional Limitations Info  Sight, Hearing, Speech Sight Info: Impaired(wears glasses , still problems seeing) Hearing Info: Adequate Speech Info: Adequate    SPECIAL CARE FACTORS FREQUENCY  PT (By licensed PT), OT (By licensed OT)     PT Frequency: 5x/week OT Frequency: 5x/week            Contractures Contractures Info: Not present    Additional Factors Info  Code Status, Allergies Code Status Info: DNR Allergies Info: No Known Allergies           Current Medications (03/16/2019):  This is the current hospital active medication list Current Facility-Administered Medications  Medication Dose Route Frequency Provider Last Rate Last Dose  . acetaminophen (TYLENOL) tablet 650 mg  650 mg Oral Q6H PRN Elwyn Reach, MD       Or  . acetaminophen (TYLENOL) suppository 650 mg  650 mg Rectal Q6H PRN Gala Romney L, MD      . atorvastatin (LIPITOR) tablet 40 mg  40 mg Oral q1800 Georgette Shell, MD   40 mg at 03/15/19 1600  . cefTRIAXone (ROCEPHIN) 1 g in sodium chloride 0.9 % 100 mL IVPB  1 g Intravenous Q24H Georgette Shell, MD 200 mL/hr at 03/16/19 0840 1 g at 03/16/19 0840  . dextrose 5 %-0.45 % sodium chloride infusion   Intravenous Continuous Garba, Mohammad L,  MD 75 mL/hr at 03/16/19 1041    . finasteride (PROSCAR) tablet 5 mg  5 mg Oral Daily Alwyn Ren, MD   5 mg at 03/16/19 0859  . [START ON 03/17/2019] fluconazole (DIFLUCAN) tablet 200 mg  200 mg Oral Daily Norva Pavlov, RPH      . LORazepam (ATIVAN) injection 0.5 mg  0.5 mg Intravenous PRN Luevenia Maxin, Mina A, PA-C   0.5 mg at 03/13/19 2245  . multivitamin with minerals tablet 1 tablet  1 tablet Oral Daily Alwyn Ren, MD      . ondansetron [facility]) tablet 4 mg  4 mg Oral Q6H PRN Rometta Emery, MD        Or  . ondansetron (ZOFRAN) injection 4 mg  4 mg Intravenous Q6H PRN Rometta Emery, MD      . sertraline (ZOLOFT) tablet 50 mg  50 mg Oral QHS Alwyn Ren, MD      . warfarin (COUMADIN) tablet 3 mg  3 mg Oral q1800 Norva Pavlov, RPH   3 mg at 03/16/19 1617  . Warfarin - Pharmacist Dosing Inpatient   Does not apply q1800 Tama Headings, [facility]         Discharge Medications: Please see discharge summary for a list of discharge medications.  Relevant Imaging Results:  Relevant Lab Results:   Additional Information soc sec [id number]  Leone Haven, RN

## 2019-03-16 NOTE — Progress Notes (Signed)
Initial Nutrition Assessment  DOCUMENTATION CODES:   Not applicable  INTERVENTION:   -MVI with minerals daily -Magic cup TID with meals, each supplement provides 290 kcal and 9 grams of protein -Downgrade diet to dysphagia 3 (advanced mechanical soft) for ease of intake  NUTRITION DIAGNOSIS:   Increased nutrient needs related to wound healing as evidenced by estimated needs.  GOAL:   Patient will meet greater than or equal to 90% of their needs  MONITOR:   PO intake, Supplement acceptance, Weight trends, Skin, I & O's  REASON FOR ASSESSMENT:   Malnutrition Screening Tool    ASSESSMENT:   Troy Gregory is a 83 y.o. male with medical history significant of dementia, atrial fibrillation, chronic kidney disease stage III, coronary artery disease status post coronary artery bypass grafting, diastolic dysfunction CHF, history of maze procedure in 2013 who presents to the ER with confusion and generalized weakness.  Patient has been weak and lethargic all day.  He is usually able to move around and does all his ADLs without difficulty.  He is got history of dementia but still communicating adequately.  Patient communicating even now and denying any chest pain, no fever no chills.  Not sure of any sick contacts.  Patient is oriented in person only but denies any dysuria.  He was noted to have significant leukocytosis.  CT head showed possible sinusitis.  No evidence of UTI or pneumonia.  Patient is being admitted with weakness and possibly some occult infection.  Pt admitted with AMS and worsening dementia.   Reviewed I/O's: +924 ml x 24 hours and +3.1 L since admission  UOP: 625 ml x 24 hours  Pt resting quietly in bed at time of visit. RD did not disturb.   Pt with poor appetite. Noted meal completion 10%.   Noted distant wt of weight loss.   Per ONT notes, pt with lt sided chronic rhinosinusitus with possible fungus balls, however, unlikely cause of AMS.   Palliative care  following; pt wife would like to continue current interventions for now, but no plans for surgery of aggressive interventions. She does not desire any type of artifical feeding. Focus of care is on comfort and dignity.   Medications reviewed and include dextrose 5%-0.45% sodium chloride infusion @ 75 ml/hr.   Labs reviewed: K: 3.0.   Diet Order:   Diet Order            DIET DYS 3 Room service appropriate? Yes; Fluid consistency: Thin  Diet effective now              EDUCATION NEEDS:   No education needs have been identified at this time  Skin:  Skin Assessment: Skin Integrity Issues: Skin Integrity Issues:: Stage II Stage II: coccyx  Last BM:  03/15/19  Height:   Ht Readings from Last 1 Encounters:  03/15/19 6\' 1"  (1.854 m)    Weight:   Wt Readings from Last 1 Encounters:  03/16/19 84.4 kg    Ideal Body Weight:  83.6 kg  BMI:  Body mass index is 24.54 kg/m.  Estimated Nutritional Needs:   Kcal:  1900-2100  Protein:  95-110 grams  Fluid:  > 1.9 L    Baer Hinton A. Jimmye Norman, RD, LDN, [place] Registered Dietitian II Certified Diabetes Care and Education Specialist Pager: [phone] After hours Pager: [phone]

## 2019-03-16 NOTE — TOC Initial Note (Addendum)
Transition of Care [facility]) - Initial/Assessment Note    Patient Details  Name: Troy Gregory MRN: [id number] Date of Birth: 1935/12/13  Transition of [facility]) CM/SW Contact:    Leone Haven, RN Phone Number: 03/16/2019, 8:37 PM  Clinical Narrative:                   Expected Discharge Plan: Skilled Nursing Facility Barriers to Discharge: No Barriers Identified   Patient Goals and CMS Choice  from home with spouse, patient is confused, per pt eval rec SNF, NCM tried to contact wife, phone number is disconnected, NCM tried to contact son Osmel Dykstra [phone], left vm for return call, to make plan for disposition.  10/13 1300 Letha Cape RN, BSN - NCM received call from wife states her phone is [phone].  She would like for NCM to fax out to SNF's and she perfers [facility] since it is closer to her.  NCM faxed patient information.  Awaiting bed offers.  10/14 Letha Cape RN, BSN- NCM received called from Marylene Land at [facility] stating patient is a [facility] patient and that NCM needs to do [place].  NCM contacted [employer] and they states they can not pull patient up in system.  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  NCM had to call [employer] back was on hold . This call took about an hour. Finally Berkley Harvey is in process.      Expected Discharge Plan and Services Expected Discharge Plan: Skilled Nursing Facility   Discharge Planning Services: CM Consult   Living arrangements for the past 2 months: Single Family Home                 DME Arranged: (NA)         HH Arranged: NA          Prior Living Arrangements/Services Living arrangements for the past 2 months: Single Family Home Lives with:: Spouse Patient language and need for interpreter reviewed:: Yes        Need for Family Participation in Patient Care: Yes (Comment) Care giver support system in place?: Yes  (comment)   Criminal Activity/Legal Involvement Pertinent to Current Situation/Hospitalization: No - Comment as needed  Activities of Daily Living Home Assistive Devices/Equipment: Cane (specify quad or straight) ADL Screening (condition at time of admission) Patient's cognitive ability adequate to safely complete daily activities?: No Is the patient deaf or have difficulty hearing?: No Does the patient have difficulty seeing, even when wearing glasses/contacts?: Yes Does the patient have difficulty concentrating, remembering, or making decisions?: Yes Patient able to express need for assistance with ADLs?: Yes Does the patient have difficulty dressing or bathing?: Yes Independently performs ADLs?: No Communication: Needs assistance Is this a change from baseline?: Change from baseline, expected to last >3 days Dressing (OT): Needs assistance Is this a change from baseline?: Pre-admission baseline Grooming: Needs assistance Is this a change from baseline?: Pre-admission baseline Feeding: Needs assistance Is this a change from baseline?: Pre-admission baseline Bathing: Needs assistance Is this a change from baseline?: Pre-admission baseline Toileting: Needs assistance Is this a change from baseline?: Pre-admission baseline In/Out Bed: Needs assistance Is this a change from baseline?: Pre-admission baseline Walks in Home: Needs assistance Is this a change from baseline?: Pre-admission baseline Does the patient have difficulty walking or climbing stairs?: Yes Weakness of Legs: Both Weakness of Arms/Hands: Both  Permission Sought/Granted                  Emotional Assessment Appearance:: Appears stated age Attitude/Demeanor/Rapport: Unable to Assess Affect (typically observed): Unable to Assess Orientation: : Oriented to Self Alcohol / Substance Use: Not Applicable Psych Involvement: No (comment)  Admission diagnosis:  Generalized weakness [R53.1] Altered mental status,  unspecified altered mental status type [R41.82] Patient Active Problem List   Diagnosis Date Noted  . Generalized weakness   . Palliative care by specialist   . DNR (do not resuscitate)   . Pressure injury of skin 03/15/2019  . AMS (altered mental status) 03/14/2019  . Syncope and collapse 08/11/2014  . CKD (chronic kidney disease) stage 3, GFR 30-59 ml/min 08/11/2014  . Chronic combined systolic and diastolic CHF-EF 42% Aug 7062 08/11/2014  . Leukocytosis 08/11/2014  . PVC's (premature ventricular contractions)   . Memory loss 09/19/2011  . UTI (lower urinary tract infection) 07/01/2011  . H/O mitral valve repair-2013 06/05/2011  . Hx of CABG 2013 06/05/2011  . PAF (paroxysmal atrial fibrillation) ([place])   . Hypertension-hypotensive on admission 12/03/2010   PCP:  Josetta Huddle, MD Pharmacy:   [facility] [place] [address] Phone: [phone] Fax: [phone]     Social Determinants of Health (SDOH) Interventions    Readmission Risk Interventions No flowsheet data found.

## 2019-03-16 NOTE — Consult Note (Signed)
Consultation Note Date: 03/16/2019   Patient Name: Troy Gregory  DOB: [birth year]  MRN: [id number]  Age / Sex: 83 y.o., male  PCP: Marden Noble, MD Referring Physician: Alwyn Ren, MD  Reason for Consultation: Establish GOCs and emotional support   HPI/Patient Profile: 83 y.o. male  admitted on 03/13/2019 with past medical history significant of dementia, atrial fibrillation, chronic kidney disease stage III, coronary artery disease status post coronary artery bypass grafting, diastolic dysfunction CHF, admitted through the ER with confusion and generalized weakness, AMS.  At baseline he is ambulatory, but wanders, confused to place and situation.  Needs 24/7 supervision.  ED Course: Temperature is 98.9 blood pressure 153/119 pulse 67 respiratory of 23 oxygen sat 91% on room air.  White count is 19.1 hemoglobin 12.8 otherwise CBC within normal.  Chemistry showed glucose 157 and creatinine 2.29 albumin 3.2 the rest appears to be within normal.    IMPRESSION:   Head CT 03-13-19 1. No acute intracranial abnormality. 2. Chronic microvascular ischemic changes as well as frontal predominant parenchymal volume loss, and intracranial atherosclerosis. 3. Nodular hypodense mural thickening throughout the left maxillary sinus, left ethmoids and left frontal sinuses with more central hyperattenuation. Worsening since comparison exam. 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 Slightly expansile appearance of this sinonasal thickening with some thinning of the left maxillary antrum, therefore mucocele is not fully excluded. 4. Poor dentition with a periapical lucency of the left maxillary first molar. Correlate with dental examination.  Family face treatment option decisions, advanced directive decisions and anticipatory care needs.   Clinical  Assessment and Goals of Care:  This NP Lorinda Creed reviewed medical records, received report from team, and then spoke to his wife by telephone  to discuss diagnosis, prognosis, GOC, EOL wishes disposition and options.  Concept of Hospice and Palliative Care were discussed  A detailed discussion was had today regarding advanced directives.  Concepts specific to code status, artifical feeding and hydration, continued IV antibiotics and rehospitalization was had.  The difference between a aggressive medical intervention path  and a palliative comfort care path for this patient at this time was had.  Values and goals of care important to patient and family were attempted to be elicited.  Wife is very pragmatic about her  husband's current medical situation.  Because of his continued cognitive decline, family is more focused on comfort and dignity than aggressive life prolonging measures.  For now they are open to medical management and hope for improvement.  Wife does not feel that "procedures or surgery" are in his best interest.  Questions and concerns addressed.   Family encouraged to call with questions or concerns.    PMT will continue to support holistically.     Wife is HPOA and main support person.  There is a son and daughter who are supportive. Care is getting more difficult in the home.    SUMMARY OF RECOMMENDATIONS    Code Status/Advance Care Planning:  DNR/DNI- documented today  No artificial feeding now or in the future   Additional Recommendations (Limitations, Scope, Preferences):  Comfort and dignity are priority in care, as noted above DNR/DNI and no artifical feeding.  Open to continued antibiotic/medical management  but wife does not believe that her husband would be a candidate for any surgery or significant OP procedure. "He is confused and get agitated if you only put a spoon to his mouth" "he just couldn't do it"    Psycho-social/Spiritual:   Desire for  further Chaplaincy support: yes  Additional Recommendations: Education on hospice offered  Created space and opportunity for wife to explore her thoughts and feelings regarding current medical situation.  She speaks to "feeling exhausted" taking care of her husband for the past eight years.  He continues to decline cognitively, wanders and needs 24/7 care and supervision.    Prognosis:   If patient continues with current limited po intake, prognosis may be as little as weeks.  Will need to monitor over the next several days.  Discharge Planning: to be determined, family is open to SNF for Short term rehab if eligible     Primary Diagnoses: Present on Admission: . PAF (paroxysmal atrial fibrillation) (HCC) . Memory loss . CKD (chronic kidney disease) stage 3, GFR 30-59 ml/min . Chronic combined systolic and diastolic CHF-EF 10%55% Aug 2014 . AMS (altered mental status)   I have reviewed the medical record, interviewed the patient and family, and examined the patient. The following aspects are pertinent.  Past Medical History:  Diagnosis Date  . Arthritis    "a little in my joints" (08/11/2014)  . Atrial fibrillation (HCC) 06/2011   S/p left sided MAZE. On Coumadin, January, 2013  . Benign prostatic hypertrophy    but stable  . Calculus of kidney   . Chronic anticoagulation   . Chronic kidney disease 12/2010   with a creatinine on discharge of 1.84, was  2.1 on admission  . Chronic kidney disease (CKD), stage III (moderate)    [name]/notes 08/11/2014  . Coronary artery disease 06/2011   S/p LIMA-LAD, SVG-intermediate coronary (?), and SVG-distal RCA with concurrent MV repair and MAZE  . Dementia (HCC)   . Ejection fraction < 50%    EF previously 50%, however 20-25% by echo and catheterization before CABG  . H/O hiatal hernia   . High cholesterol   . Hypertension   . Mitral regurgitation 06/2011   Mitral valve ring annuloplasty at the time of CABG,, January, 2013  . Myocardial  infarction [facility](HCC)    "I believe I've had a heart attack; don't remember when" (3/92016)  . Shortness of breath   . Syncope    Near-syncope, etiology is deemed secondary to the excessive heat   Social History   Socioeconomic History  . Marital status: Married    Spouse name: Not on file  . Number of children: Not on file  . Years of education: Not on file  . Highest education level: Not on file  Occupational History  . Not on file  Social Needs  . Financial resource strain: Not on file  . Food insecurity    Worry: Not on file    Inability: Not on file  . Transportation needs    Medical: Not on file    Non-medical: Not on file  Tobacco Use  . Smoking status: Former Smoker    Packs/day: 1.00    Years: 45.00    Pack years: 45.00    Types: Cigarettes, Cigars  Quit date: 07/01/1964    Years since quitting: 54.7  . Smokeless tobacco: Former Systems developer    Types: Chew  Substance and Sexual Activity  . Alcohol use: Yes    Comment: 08/11/2014 "a couple shots maybe once/year now"  . Drug use: No  . Sexual activity: Not Currently  Lifestyle  . Physical activity    Days per week: Not on file    Minutes per session: Not on file  . Stress: Not on file  Relationships  . Social Herbalist on phone: Not on file    Gets together: Not on file    Attends religious service: Not on file    Active member of club or organization: Not on file    Attends meetings of clubs or organizations: Not on file    Relationship status: Not on file  Other Topics Concern  . Not on file  Social History Narrative   Has a wife Eutha [phone] and [phone]. Currently at rehab [facility] after cardiac surgery 06/2011.    Family History  Problem Relation Age of Onset  . Cancer Father   . Lung cancer Father   . Diabetes Father   . Hypertension Mother   . Hypertension Brother    Scheduled Meds: . atorvastatin  40 mg Oral q1800  . finasteride  5 mg Oral Daily  . fluconazole  50 mg Oral Daily  .  sertraline  50 mg Oral QHS  . warfarin  3 mg Oral q1800  . Warfarin - Pharmacist Dosing Inpatient   Does not apply q1800   Continuous Infusions: . cefTRIAXone (ROCEPHIN)  IV 1 g (03/16/19 0840)  . dextrose 5 % and 0.45% NaCl 75 mL/hr at 03/15/19 1737  . magnesium sulfate bolus IVPB     PRN Meds:.acetaminophen **OR** acetaminophen, LORazepam, ondansetron **OR** ondansetron (ZOFRAN) IV Medications Prior to Admission:  Prior to Admission medications   Medication Sig Start Date End Date Taking? Authorizing Provider  amLODipine (NORVASC) 5 MG tablet Take 5 mg by mouth daily.  08/13/13  Yes [provider]  atorvastatin (LIPITOR) 40 MG tablet TAKE 1 TABLET BY MOUTH DAILY AFTER BREAKFAST Patient taking differently: Take 40 mg by mouth daily at 6 PM.  03/25/15  Yes Martinique, Peter M, MD  carvedilol (COREG) 12.5 MG tablet Take 12.5 mg by mouth daily.   Yes [provider]  carvedilol (COREG) 3.125 MG tablet Take 1 tablet (3.125 mg total) by mouth 2 (two) times daily with a meal. NEED OV. Patient taking differently: Take 12.5 mg by mouth daily. NEED OV. 03/28/18  Yes [place], Peter M, MD  donepezil (ARICEPT) 10 MG tablet Take 10 mg by mouth at bedtime.    Yes [provider]  finasteride (PROSCAR) 5 MG tablet Take 5 mg by mouth daily.  08/07/13  Yes [provider]  lisinopril (PRINIVIL,ZESTRIL) 20 MG tablet Take 20 mg by mouth daily.  02/25/12  Yes Josetta Huddle, MD  sertraline (ZOLOFT) 50 MG tablet Take 50 mg by mouth at bedtime. 12/01/18  Yes [provider]  warfarin (COUMADIN) 3 MG tablet Take as directed by coumadin clinic Patient taking differently: Take 3 mg by mouth daily.  04/21/12  Yes [place], Peter M, MD  nitrofurantoin, macrocrystal-monohydrate, (MACROBID) 100 MG capsule Take 1 capsule (100 mg total) by mouth 2 (two) times daily. Patient not taking: Reported on 03/14/2019 08/12/14   Robbie Lis, MD  potassium chloride SA (K-DUR,KLOR-CON) 20 MEQ  tablet TAKE 1/2  TABLET BY MOUTH TWICE DAILY Patient not taking: Reported on 03/14/2019 08/08/17   [place], Peter M, MD   No Known Allergies Review of Systems  Physical Exam  Vital Signs: BP (!) 157/93   Pulse 61   Temp (!) 97.2 F (36.2 C)   Resp 18   Ht 6\' 1"  (1.854 m)   Wt 84.4 kg   SpO2 100%   BMI 24.54 kg/m  Pain Scale: 0-10   Pain Score: 0-No pain   SpO2: SpO2: 100 % O2 Device:SpO2: 100 % O2 Flow Rate: .   IO: Intake/output summary:   Intake/Output Summary (Last 24 hours) at 03/16/2019 05/16/2019 Last data filed at 03/16/2019 0900 Gross per 24 hour  Intake 1668.7 ml  Output 625 ml  Net 1043.7 ml    LBM: Last BM Date: 03/15/19 Baseline Weight: Weight: 78.9 kg Most recent weight: Weight: 84.4 kg     Palliative Assessment/Data:   Discussed with Dr 05/15/19 and bedside RN  Time In: 0900 Time Out: 1015 Time Total: 75 minutes Greater than 50%  of this time was spent counseling and coordinating care related to the above assessment and plan.  Signed by: 1016, NP   Please contact Palliative Medicine Team phone at [phone] for questions and concerns.  For individual provider: See [phone]

## 2019-03-16 NOTE — Evaluation (Signed)
Physical Therapy Evaluation Patient Details Name: Troy Gregory MRN: [id number] DOB: [birth year] Today's Date: 03/16/2019   History of Present Illness  83 y.o. male with medical history significant of dementia, atrial fibrillation, chronic kidney disease stage III, coronary artery disease status post coronary artery bypass grafting, diastolic dysfunction CHF, history of maze procedure in 2013 who presents to the ER with confusion and generalized weakness.  Clinical Impression  Patient presents with decreased mobility due to deficits listed in PT problem list.  Currently pt needing max A for mobility up to EOB and standing at bedside, but unable to safely take steps with +1 A.  Unsure of prior functional level, but likely high fall risk even with family assistance.  Recommend SNF level rehab at d/c.  PT to follow acutely as well.     Follow Up Recommendations SNF    Equipment Recommendations  Other (comment)(TBA)    Recommendations for Other Services       Precautions / Restrictions Precautions Precautions: Fall Required Braces or Orthoses: Other Brace Other Brace: hand mitts      Mobility  Bed Mobility Overal bed mobility: Needs Assistance Bed Mobility: Supine to Sit;Sit to Supine     Supine to sit: HOB elevated;Max assist Sit to supine: Min assist   General bed mobility comments: assist to guide legs off bed and to lift trunk after multiple cues encouraging transition; to supine assist for positioning shoulder and scooting up in bed once supine  Transfers Overall transfer level: Needs assistance Equipment used: Rolling walker (2 wheeled) Transfers: Sit to/from Stand Sit to Stand: Max assist         General transfer comment: heavy lifting assist and pt with posterior bias standing at bedside  Ambulation/Gait             General Gait Details: unable  Stairs            Wheelchair Mobility    Modified Rankin (Stroke Patients Only)       Balance  Overall balance assessment: Needs assistance Sitting-balance support: Feet unsupported;Feet supported Sitting balance-Leahy Scale: Poor Sitting balance - Comments: leaning back, assist for attention to keep balance versus min to mod A Postural control: Posterior lean Standing balance support: Bilateral upper extremity supported Standing balance-Leahy Scale: Poor Standing balance comment: mod max A for standing with RW with posterior bais about 15 seconds                             Pertinent Vitals/Pain Pain Assessment: No/denies pain    Home Living Family/patient expects to be discharged to:: Private residence Living Arrangements: Spouse/significant other Available Help at Discharge: Family;Available PRN/intermittently Type of Home: House                Prior Function Level of Independence: Needs assistance         Comments: patient confused, no family available, reports wife helps sometimes with ADL's, sometimes uses cane or walker     Hand Dominance        Extremity/Trunk Assessment   Upper Extremity Assessment Upper Extremity Assessment: Generalized weakness    Lower Extremity Assessment Lower Extremity Assessment: Generalized weakness    Cervical / Trunk Assessment Cervical / Trunk Assessment: Other exceptions Cervical / Trunk Exceptions: stiffness in trunk  Communication   Communication: No difficulties  Cognition Arousal/Alertness: Awake/alert Behavior During Therapy: Impulsive Overall Cognitive Status: No family/caregiver present to determine baseline cognitive functioning Area of Impairment:  Orientation;Attention;Following commands;Safety/judgement                 Orientation Level: Disoriented to;Place;Time;Situation Current Attention Level: Focused   Following Commands: Follows one step commands inconsistently;Follows one step commands with increased time Safety/Judgement: Decreased awareness of safety;Decreased awareness of  deficits     General Comments: max cues to maintain balance on EOB due to distracted and tugging at gait belt and at gown with LOB posterior      General Comments      Exercises     Assessment/Plan    PT Assessment Patient needs continued PT services  PT Problem List Decreased strength;Decreased mobility;Decreased cognition;Decreased balance;Decreased safety awareness       PT Treatment Interventions DME instruction;Therapeutic activities;Balance training;Cognitive remediation;Patient/family education;Therapeutic exercise;Functional mobility training;Gait training    PT Goals (Current goals can be found in the Care Plan section)  Acute Rehab PT Goals Patient Stated Goal: unable to state PT Goal Formulation: Patient unable to participate in goal setting Time For Goal Achievement: 03/30/19 Potential to Achieve Goals: Fair    Frequency Min 2X/week   Barriers to discharge        Co-evaluation               AM-PAC PT "6 Clicks" Mobility  Outcome Measure Help needed turning from your back to your side while in a flat bed without using bedrails?: A Lot Help needed moving from lying on your back to sitting on the side of a flat bed without using bedrails?: Total Help needed moving to and from a bed to a chair (including a wheelchair)?: Total Help needed standing up from a chair using your arms (e.g., wheelchair or bedside chair)?: Total Help needed to walk in hospital room?: Total Help needed climbing 3-5 steps with a railing? : Total 6 Click Score: 7    End of Session Equipment Utilized During Treatment: Gait belt Activity Tolerance: Patient limited by fatigue Patient left: in bed;with call bell/phone within reach;with restraints reapplied(called NS to inform staff to set bed alarm)   PT Visit Diagnosis: Other abnormalities of gait and mobility (R26.89);Other symptoms and signs involving the nervous system (R29.898);Muscle weakness (generalized) (M62.81)    Time:  1884-1660 PT Time Calculation (min) (ACUTE ONLY): 18 min   Charges:   PT Evaluation $PT Eval High Complexity: 1 High          Magda Kiel, [place] Acute [facility] Services [phone] 03/16/2019   Reginia Naas 03/16/2019, 1:57 PM

## 2019-03-16 NOTE — Progress Notes (Addendum)
  ANTICOAGULATION CONSULT NOTE - Follow Up Consult  Pharmacy Consult for Coumadin + Fluconazole Indication: atrial fibrillation  No Known Allergies  Patient Measurements: Height: 6\' 1"  (185.4 cm) Weight: 186 lb (84.4 kg) IBW/kg (Calculated) : 79.9  Vital Signs: Temp: 97.2 F (36.2 C) (10/12 0830) Temp Source: Oral (10/12 0500) BP: 157/93 (10/12 0830) Pulse Rate: 61 (10/12 0830)  Labs: Recent Labs    03/14/19 0350 03/15/19 1158 03/16/19 0605  HGB 12.4* 12.2* 11.7*  HCT 38.1* 35.8* 35.2*  PLT 136* 131* 164  LABPROT  --  22.4* 24.4*  INR  --  2.0* 2.2*  CREATININE 2.09* 1.76* 1.50*  CKTOTAL  --  783*  --     Estimated Creatinine Clearance: 43.6 mL/min (A) (by C-G formula based on SCr of 1.5 mg/dL (H)).  Assessment:  Anticoag: Warfarin PTA for Afib. Hgb 11.7 trending down. Plts 164 up. INR 2.2 in goal. - PTA dose: 3 mg daily  Goal of Therapy:  INR 2-3 Monitor platelets by anticoagulation protocol: Yes   Plan:  In consulting with 2 ID pharmacists, will increase Flucazole to 200mg /d x 10d per ENT note. Warfarin 3 mg q1800 Daily INR Watch increased INR with fluconazole + warfarin K: Kdur 40 Mg- Mag 2g IV x 1 F/u to resume home meds: Coreg, Norvasc, ARicept, lisinopril    Rayelle Armor S. Alford Highland, PharmD, [place] Clinical Staff Pharmacist Eilene Ghazi Stillinger 03/16/2019,9:50 AM

## 2019-03-16 NOTE — Progress Notes (Signed)
PROGRESS NOTE    Troy Gregory  ONG:[id number] DOB: 11/15/1935 DOA: 03/13/2019 PCP: Marden NobleGates, Robert, MD    Brief Narrative: [age] y.o.malewith medical history significant ofdementia, atrial fibrillation, chronic kidney disease stage III, coronary artery disease status post coronary artery bypass grafting, diastolic dysfunction CHF, history of maze procedure in 2013 who presents to the ER with confusion and generalized weakness. Patient has been weak and lethargic all day. He is usually able to move around and does all his ADLs without difficulty. He is got history of dementia but still communicating adequately. Patient communicating even now and denying any chest pain, no fever no chills. Not sure of any sick contacts. Patient is oriented in person only but denies any dysuria. He was noted to have significant leukocytosis. CT head showed possible sinusitis. No evidence of UTI or pneumonia. Patient is being admitted with weakness and possibly some occult infection.  ED Course:Temperature is 98.9 blood pressure 153/119 pulse 67 respiratory of 23 oxygen sat 91% on room air.White count is 19.1 hemoglobin 12.8 otherwise CBC within normal. Chemistry showed glucose 157 and creatinine 2.29 albumin 3.2 the rest appears to be within normal. COVID-19 screen is pending. Patient being admitted with altered mental status confusion probably due to dementia.  03/15/2019 patient remains confused trying to climb out of bed does not follow commands or answer questions appropriately did not eat anything yesterday remains on IV fluids  03/16/2019 patient remains pleasantly confused trying to climb out of bed staff reports no acute events overnight he is incontinent of urine.  Assessment & Plan:   Principal Problem:   AMS (altered mental status) Active Problems:   PAF (paroxysmal atrial fibrillation) (HCC)   Hx of CABG 2013   Memory loss   CKD (chronic kidney disease) stage 3, GFR 30-59 ml/min    Chronic combined systolic and diastolic CHF-EF 44%55% Aug 2014   Pressure injury of skin   Generalized weakness   Palliative care by specialist   DNR (do not resuscitate)  #1  E. coli UTI -patient with history of dementia admitted with increasing confusion and generalized weakness.  He has not had anything to eat from the daily was admitted till today.  He has been put on IV fluids. Continue Rocephin Rocephin started 03/15/2019. CT of the head showed no acute abnormality chronic microvascular ischemic changes noted with parenchymal volume loss and intracranial atherosclerosis.  Concern for fungal allergic fungal sinusitis or sinonasal polyposis.  Left maxillary left ethmoid left frontal sinus cyst with mural thickening worse since comparison exam.  Discussed with Dr. Suszanne Connerseoh ENT advised to start the patient on fluconazole and will need outpatient elective surgery to correct fungus ball in the sinuses.  According to wife patient may not be able to tolerate this and does not want him to undergo any aggressive measures.   Appreciate palliative care input patient DNR.   #2 paroxysmal atrial fibrillation by history in normal sinus rhythm now heart rate in high 50s patient on Coreg 12.5 mg daily at home which is on hold.  Restart Coumadin.  #3 diastolic dysfunction stable patient is more on the dry side  #4 history of CAD and CABG patient is on Lipitor Norvasc Coreg lisinopril Coumadin at home.  Restart Coumadin with history of paroxysmal A. fib.   #5 history of hypertension stable.  He takes Norvasc 5 mg daily at home with Coreg 12.5 mg daily with lisinopril 20 mg daily B cell have been on hold as patient is more dehydrated with  a normal to soft blood pressure.  #6 history of dementia on Aricept at home  #7 depression on Zoloft at home restart Zoloft  #8 BPH continue Proscar  #9 hypokalemia with hypomagnesemia replete and recheck.   Pressure Injury 03/14/19 Coccyx Stage II -  Partial  thickness loss of dermis presenting as a shallow open ulcer with a red, pink wound bed without slough. (Active)  03/14/19 2100  Location: Coccyx  Location Orientation:   Staging: Stage II -  Partial thickness loss of dermis presenting as a shallow open ulcer with a red, pink wound bed without slough.  Wound Description (Comments):   Present on Admission: Yes      Estimated body mass index is 24.54 kg/m as calculated from the following:   Height as of this encounter:  (1.854 m).   Weight as of this encounter: 84.4 kg.  DVT prophylaxis: Heparin Code Status: Full code Family Communication: No family at bedside called patient's wife no response then I called patient's son discussed with him.   Disposition Plan: PEnding clinical improvement might need SNF PT eval pending Consultants:   Palliative care  Procedures: None Antimicrobials: Rocephin x1 dose 03/15/2019   Subjective:  Patient laying in bed incontinent of urine pleasantly confused Objective: Vitals:   03/16/19 0003 03/16/19 0500 03/16/19 0830 03/16/19 1132  BP: 140/78 (!) 156/89 (!) 157/93 (!) 116/56  Pulse: (!) 59  61 (!) 57  Resp: Temp: 97.9 F (36.6 C) 98.1 F (36.7 C) (!) 97.2 F (36.2 C) (!) 97.4 F (36.3 C)  TempSrc: Oral Oral  Oral  SpO2: 97% 98% 100% 98%  Weight:  84.4 kg    Height:        Intake/Output Summary (Last 24 hours) at 03/16/2019 1253 Last data filed at 03/16/2019 0900 Gross per 24 hour  Intake 1218.7 ml  Output 625 ml  Net 593.7 ml   Filed Weights   03/15/19 0039 03/16/19 0500  Weight: 78.9 kg 84.4 kg    Examination:  General exam: Appears confused pleasant Respiratory system: Clear to auscultation. Respiratory effort normal. Cardiovascular system: S1 & S2 heard, RRR. No JVD, murmurs, rubs, gallops or clicks. No pedal edema. Gastrointestinal system: Abdomen is nondistended, soft and nontender. No organomegaly or masses felt. Normal bowel sounds heard. Central  nervous system: Alert and oriented. No focal neurological deficits. Extremities:no Edema  skin: No rashes, lesions or ulcers   Data Reviewed: I have personally reviewed following labs and imaging studies  CBC: Recent Labs  Lab 03/13/19 2103 03/14/19 0350 03/15/19 1158 03/16/19 0605  WBC 19.1* 17.4* 14.1* 9.0  NEUTROABS 16.7*  --   --  7.6  HGB 12.8* 12.4* 12.2* 11.7*  HCT 40.0 38.1* 35.8* 35.2*  MCV 94.3 94.8 91.3 91.4  PLT 167 136* 131* 164   Basic Metabolic Panel: Recent Labs  Lab 03/13/19 2346 03/14/19 0350 03/15/19 1158 03/16/19 0605  NA 143 141 138 139  K 4.5 4.1 3.9 3.0*  CL 105 105 106 105  CO2 GLUCOSE 157* 157* 130* 113*  BUN 21 22 26* 20  CREATININE 2.29* 2.09* 1.76* 1.50*  CALCIUM 8.9 9.0 9.0 8.7*  MG  --   --   --  1.6*   GFR: Estimated Creatinine Clearance: 43.6 mL/min (A) (by C-G formula based on SCr of 1.5 mg/dL (H)). Liver Function Tests: Recent Labs  Lab 03/13/19 2346 03/14/19 0350 03/15/19 1158 03/16/19 0605  AST 34 29 66*  73*  ALT 19 17 28  42  ALKPHOS 73 68 76 69  BILITOT 1.0 1.3* 0.8 0.9  PROT 6.5 6.7 6.2* 6.9  ALBUMIN 3.2* 3.0* 2.8* 2.6*   No results for input(s): LIPASE, AMYLASE in the last 168 hours. No results for input(s): AMMONIA in the last 168 hours. Coagulation Profile: Recent Labs  Lab 03/15/19 1158 03/16/19 0605  INR 2.0* 2.2*   Cardiac Enzymes: Recent Labs  Lab 03/15/19 1158  CKTOTAL 783*   BNP (last 3 results) No results for input(s): PROBNP in the last 8760 hours. HbA1C: No results for input(s): HGBA1C in the last 72 hours. CBG: Recent Labs  Lab 03/13/19 2159  GLUCAP 152*   Lipid Profile: No results for input(s): CHOL, HDL, LDLCALC, TRIG, CHOLHDL, LDLDIRECT in the last 72 hours. Thyroid Function Tests: Recent Labs    03/14/19 0351  TSH 1.429   Anemia Panel: No results for input(s): VITAMINB12, FOLATE, FERRITIN, TIBC, IRON, RETICCTPCT in the last 72 hours. Sepsis Labs: No results for  input(s): PROCALCITON, LATICACIDVEN in the last 168 hours.  Recent Results (from the past 240 hour(s))  Urine culture     Status: Abnormal   Collection Time: 03/13/19  9:03 PM   Specimen: Urine, Random  Result Value Ref Range Status   Specimen Description URINE, RANDOM  Final   Special Requests   Final    NONE Performed at [facility], [address] [place], [place]    Culture >=100,000 COLONIES/mL ESCHERICHIA COLI (A)  Final   Report Status 03/16/2019 FINAL  Final   Organism ID, Bacteria ESCHERICHIA COLI (A)  Final      Susceptibility   Escherichia coli - MIC*    AMPICILLIN 4 SENSITIVE Sensitive     CEFAZOLIN <=4 SENSITIVE Sensitive     CEFTRIAXONE <=1 SENSITIVE Sensitive     CIPROFLOXACIN <=0.25 SENSITIVE Sensitive     GENTAMICIN <=1 SENSITIVE Sensitive     IMIPENEM <=0.25 SENSITIVE Sensitive     NITROFURANTOIN <=16 SENSITIVE Sensitive     TRIMETH/SULFA <=20 SENSITIVE Sensitive     AMPICILLIN/SULBACTAM <=2 SENSITIVE Sensitive     PIP/TAZO <=4 SENSITIVE Sensitive     Extended ESBL NEGATIVE Sensitive     * >=100,000 COLONIES/mL ESCHERICHIA COLI  SARS CORONAVIRUS 2 (TAT 6-24 HRS) Nasopharyngeal Nasopharyngeal Swab     Status: None   Collection Time: 03/14/19 12:14 AM   Specimen: Nasopharyngeal Swab  Result Value Ref Range Status   SARS Coronavirus 2 NEGATIVE NEGATIVE Final    Comment: (NOTE) SARS-CoV-2 target nucleic acids are NOT DETECTED. The SARS-CoV-2 RNA is generally detectable in upper and lower respiratory specimens during the acute phase of infection. Negative results do not preclude SARS-CoV-2 infection, do not rule out co-infections with other pathogens, and should not be used as the sole basis for treatment or other patient management decisions. Negative results must be combined with clinical observations, patient history, and epidemiological information. The expected result is Negative. Fact Sheet for  Patients: 05/14/19 Fact Sheet for Healthcare Providers: HairSlick.no This test is not yet approved or cleared by the quierodirigir.com FDA and  has been authorized for detection and/or diagnosis of SARS-CoV-2 by FDA under an Emergency Use Authorization (EUA). This EUA will remain  in effect (meaning this test can be used) for the duration of the COVID-19 declaration under Section 56 4(b)(1) of the Act, 21 U.S.C. section 360bbb-3(b)(1), unless the authorization is terminated or revoked sooner. Performed at [facility], [address], [place], [place] [address]          Radiology Studies: No results found.      Scheduled Meds: . atorvastatin  40 mg Oral q1800  . finasteride  5 mg Oral Daily  . [START ON 03/17/2019] fluconazole  200 mg Oral Daily  . sertraline  50 mg Oral QHS  . warfarin  3 mg Oral q1800  . Warfarin - Pharmacist Dosing Inpatient   Does not apply q1800   Continuous Infusions: . cefTRIAXone (ROCEPHIN)  IV 1 g (03/16/19 0840)  . dextrose 5 % and 0.45% NaCl 75 mL/hr at 03/16/19 1041  . magnesium sulfate bolus IVPB 2 g (03/16/19 1201)     LOS: 2 days     Georgette Shell, MD [facility]  If 7PM-7AM, please contact night-coverage [email or web address] Password [facility] 03/16/2019, 12:53 PM

## 2019-03-17 DIAGNOSIS — R627 Adult failure to thrive: Secondary | ICD-10-CM

## 2019-03-17 LAB — BASIC METABOLIC PANEL
Anion gap: 10 (ref 5–15)
BUN: 16 mg/dL (ref 8–23)
CO2: 25 mmol/L (ref 22–32)
Calcium: 9.1 mg/dL (ref 8.9–10.3)
Chloride: 105 mmol/L (ref 98–111)
Creatinine, Ser: 1.52 mg/dL — ABNORMAL HIGH (ref 0.61–1.24)
GFR calc Af Amer: 49 mL/min — ABNORMAL LOW (ref >60)
GFR calc non Af Amer: 42 mL/min — ABNORMAL LOW (ref >60)
Glucose, Bld: 107 mg/dL — ABNORMAL HIGH (ref 70–99)
Potassium: 4.2 mmol/L (ref 3.5–5.1)
Sodium: 140 mmol/L (ref 135–145)

## 2019-03-17 LAB — PROTIME-INR
INR: 2.1 — ABNORMAL HIGH (ref 0.8–1.2)
Prothrombin Time: 22.9 seconds — ABNORMAL HIGH (ref 11.4–15.2)

## 2019-03-17 LAB — MAGNESIUM: Magnesium: 2.2 mg/dL (ref 1.7–2.4)

## 2019-03-17 NOTE — Progress Notes (Signed)
PROGRESS NOTE    Troy Gregory  LZJ:[id number] DOB: [birth year] DOA: 03/13/2019 PCP: Marden Noble, MD  Brief Narrative: 83 y.o.malewith medical history significant ofdementia, atrial fibrillation, chronic kidney disease stage III, coronary artery disease status post coronary artery bypass grafting, diastolic dysfunction CHF, history of maze procedure in 2013 who presents to the ER with confusion and generalized weakness. Patient has been weak and lethargic all day. He is usually able to move around and does all his ADLs without difficulty. He is got history of dementia but still communicating adequately. Patient communicating even now and denying any chest pain, no fever no chills. Not sure of any sick contacts. Patient is oriented in person only but denies any dysuria. He was noted to have significant leukocytosis. CT head showed possible sinusitis. No evidence of UTI or pneumonia. Patient is being admitted with weakness and possibly some occult infection.  ED Course:Temperature is 98.9 blood pressure 153/119 pulse 67 respiratory of 23 oxygen sat 91% on room air.White count is 19.1 hemoglobin 12.8 otherwise CBC within normal. Chemistry showed glucose 157 and creatinine 2.29 albumin 3.2 the rest appears to be within normal. COVID-19 screen is pending. Patient being admitted with altered mental status confusion probably due to dementia.  03/15/2019 patient remains confused trying to climb out of bed does not follow commands or answer questions appropriately did not eat anything yesterday remains on IV fluids  03/16/2019 patient remains pleasantly confused trying to climb out of bed staff reports no acute events overnight he is incontinent of urine.  03/17/2019-patient refusing to eat    Assessment & Plan:   Principal Problem:   AMS (altered mental status) Active Problems:   PAF (paroxysmal atrial fibrillation) (HCC)   Hx of CABG 2013   Memory loss   CKD (chronic kidney  disease) stage 3, GFR 30-59 ml/min   Chronic combined systolic and diastolic CHF-EF 02% Aug 2014   Pressure injury of skin   Generalized weakness   Palliative care by specialist   DNR (do not resuscitate)   #1  E. coli UTI -patient with history of dementia admitted with increasing confusion and generalized weakness.  He has not had anything to eat from the daily was admitted till today.  He has been put on IV fluids. Rocephin started 03/15/2019.   CT of the head showed no acute abnormality chronic microvascular ischemic changes noted with parenchymal volume loss and intracranial atherosclerosis. Concern for fungal allergic fungal sinusitisor sinonasal polyposis. Left maxillary left ethmoid left frontal sinus cyst with mural thickening worse since comparison exam.  Discussed with Dr. Suszanne Conners ENT advised to start the patient on fluconazole and will need outpatient elective surgery to correct fungus ball in the sinuses.  According to wife patient may not be able to tolerate this and does not want him to undergo any aggressive measures.   Appreciate palliative care input patient DNR.   #2 paroxysmal atrial fibrillation by history in normal sinus rhythm now heart rate in high 50s patient on Coreg 12.5 mg daily at home which is on hold.  Continue Coumadin INR therapeutic 2.1.  Monitor INR on fluconazole.   #3 diastolic dysfunction stable patient is more on the dry side  #4 history of CAD and CABG patient is on Lipitor Norvasc Coreg lisinopril Coumadin at home. Restart Coumadin with history of paroxysmal A. fib.   #5 history of hypertension stable.  Pressure 134/76. He takes Norvasc 5 mg daily at home with Coreg 12.5 mg daily with lisinopril 20 mg  daily these medications have been on hold due to soft blood pressure.    #6 history of dementia on Aricept at home  #7 depression on Zoloft at home restart Zoloft  #8 BPH continue Proscar  Pressure Injury 03/14/19 Coccyx Stage II -   Partial thickness loss of dermis presenting as a shallow open ulcer with a red, pink wound bed without slough. (Active)  03/14/19 2100  Location: Coccyx  Location Orientation:   Staging: Stage II -  Partial thickness loss of dermis presenting as a shallow open ulcer with a red, pink wound bed without slough.  Wound Description (Comments):   Present on Admission: Yes     Nutrition Problem: Increased nutrient needs Etiology: wound healing     Signs/Symptoms: estimated needs    Interventions: MVI, Magic cup  Estimated body mass index is 25.2 kg/m as calculated from the following:   Height as of this encounter: 6\' 1"  (1.854 m).   Weight as of this encounter: 86.6 kg.   DVT prophylaxis:Coumadin Code Status:Full code Family Communication:No family at bedside called patient's wife no response then I called patient's son discussed with him.  Disposition Plan:PEnding clinical improvement might need SNF PT eval pending Consultants:  Palliative care  Procedures:None Antimicrobials:Rocephin x1 dose 03/15/2019   Subjective: Resting in bed pleasantly confused talkative Staff reports he drank coffee but refuses to eat this morning Objective: Vitals:   03/17/19 0006 03/17/19 0615 03/17/19 0845 03/17/19 1051  BP: (!) 158/75 120/66 125/68 134/76  Pulse: 64 67 (!) 58 60  Resp: 20 18 18 16   Temp: 98.9 F (37.2 C) (!) 97.5 F (36.4 C) 97.7 F (36.5 C) (!) 97.2 F (36.2 C)  TempSrc: Oral Oral Oral Oral  SpO2: 97% 96%    Weight:  86.6 kg    Height:        Intake/Output Summary (Last 24 hours) at 03/17/2019 1120 Last data filed at 03/17/2019 0900 Gross per 24 hour  Intake 2003.31 ml  Output 1850 ml  Net 153.31 ml   Filed Weights   03/15/19 0039 03/16/19 0500 03/17/19 0615  Weight: 78.9 kg 84.4 kg 86.6 kg    Examination:  General exam: Appears calm and comfortable  Respiratory system: Clear to auscultation. Respiratory effort normal. Cardiovascular system:  S1 & S2 heard, RRR. No JVD, murmurs, rubs, gallops or clicks. No pedal edema. Gastrointestinal system: Abdomen is nondistended, soft and nontender. No organomegaly or masses felt. Normal bowel sounds heard. Central nervous system: Pleasantly confused  extremities: Symmetric 5 x 5 power. Skin: No rashes, lesions or ulcers   Data Reviewed: I have personally reviewed following labs and imaging studies  CBC: Recent Labs  Lab 03/13/19 2103 03/14/19 0350 03/15/19 1158 03/16/19 0605  WBC 19.1* 17.4* 14.1* 9.0  NEUTROABS 16.7*  --   --  7.6  HGB 12.8* 12.4* 12.2* 11.7*  HCT 40.0 38.1* 35.8* 35.2*  MCV 94.3 94.8 91.3 91.4  PLT 167 136* 131* 762   Basic Metabolic Panel: Recent Labs  Lab 03/13/19 2346 03/14/19 0350 03/15/19 1158 03/16/19 0605 03/17/19 0614  NA 143 141 138 139 140  K 4.5 4.1 3.9 3.0* 4.2  CL 105 105 106 105 105  CO2 26 23 24 25 25   GLUCOSE 157* 157* 130* 113* 107*  BUN 21 22 26* 20 16  CREATININE 2.29* 2.09* 1.76* 1.50* 1.52*  CALCIUM 8.9 9.0 9.0 8.7* 9.1  MG  --   --   --  1.6* 2.2   GFR: Estimated Creatinine  Clearance: 43.1 mL/min (A) (by C-G formula based on SCr of 1.52 mg/dL (H)). Liver Function Tests: Recent Labs  Lab 03/13/19 2346 03/14/19 0350 03/15/19 1158 03/16/19 0605  AST 34 29 66* 73*  ALT 19 17 28  42  ALKPHOS 73 68 76 69  BILITOT 1.0 1.3* 0.8 0.9  PROT 6.5 6.7 6.2* 6.9  ALBUMIN 3.2* 3.0* 2.8* 2.6*   No results for input(s): LIPASE, AMYLASE in the last 168 hours. No results for input(s): AMMONIA in the last 168 hours. Coagulation Profile: Recent Labs  Lab 03/15/19 1158 03/16/19 0605 03/17/19 0614  INR 2.0* 2.2* 2.1*   Cardiac Enzymes: Recent Labs  Lab 03/15/19 1158  CKTOTAL 783*   BNP (last 3 results) No results for input(s): PROBNP in the last 8760 hours. HbA1C: No results for input(s): HGBA1C in the last 72 hours. CBG: Recent Labs  Lab 03/13/19 2159  GLUCAP 152*   Lipid Profile: No results for input(s): CHOL, HDL,  LDLCALC, TRIG, CHOLHDL, LDLDIRECT in the last 72 hours. Thyroid Function Tests: No results for input(s): TSH, T4TOTAL, FREET4, T3FREE, THYROIDAB in the last 72 hours. Anemia Panel: No results for input(s): VITAMINB12, FOLATE, FERRITIN, TIBC, IRON, RETICCTPCT in the last 72 hours. Sepsis Labs: No results for input(s): PROCALCITON, LATICACIDVEN in the last 168 hours.  Recent Results (from the past 240 hour(s))  Urine culture     Status: Abnormal   Collection Time: 03/13/19  9:03 PM   Specimen: Urine, Random  Result Value Ref Range Status   Specimen Description URINE, RANDOM  Final   Special Requests   Final    NONE Performed at [facility], [address] [place], [place] [address]    Culture >=100,000 COLONIES/mL ESCHERICHIA COLI (A)  Final   Report Status 03/16/2019 FINAL  Final   Organism ID, Bacteria ESCHERICHIA COLI (A)  Final      Susceptibility   Escherichia coli - MIC*    AMPICILLIN 4 SENSITIVE Sensitive     CEFAZOLIN <=4 SENSITIVE Sensitive     CEFTRIAXONE <=1 SENSITIVE Sensitive     CIPROFLOXACIN <=0.25 SENSITIVE Sensitive     GENTAMICIN <=1 SENSITIVE Sensitive     IMIPENEM <=0.25 SENSITIVE Sensitive     NITROFURANTOIN <=16 SENSITIVE Sensitive     TRIMETH/SULFA <=20 SENSITIVE Sensitive     AMPICILLIN/SULBACTAM <=2 SENSITIVE Sensitive     PIP/TAZO <=4 SENSITIVE Sensitive     Extended ESBL NEGATIVE Sensitive     * >=100,000 COLONIES/mL ESCHERICHIA COLI  SARS CORONAVIRUS 2 (TAT 6-24 HRS) Nasopharyngeal Nasopharyngeal Swab     Status: None   Collection Time: 03/14/19 12:14 AM   Specimen: Nasopharyngeal Swab  Result Value Ref Range Status   SARS Coronavirus 2 NEGATIVE NEGATIVE Final    Comment: (NOTE) SARS-CoV-2 target nucleic acids are NOT DETECTED. The SARS-CoV-2 RNA is generally detectable in upper and lower respiratory specimens during the acute phase of infection. Negative results do not preclude SARS-CoV-2 infection, do not rule out co-infections with  other pathogens, and should not be used as the sole basis for treatment or other patient management decisions. Negative results must be combined with clinical observations, patient history, and epidemiological information. The expected result is Negative. Fact Sheet for Patients: HairSlick.nohttps://www.fda.gov/media/138098/download Fact Sheet for Healthcare Providers: quierodirigir.comhttps://www.fda.gov/media/138095/download This test is not yet approved or cleared by the [place] FDA and  has been authorized for detection and/or diagnosis of SARS-CoV-2 by FDA under an Emergency Use Authorization (EUA). This EUA will remain  in effect (meaning  this test can be used) for the duration of the COVID-19 declaration under Section 56 4(b)(1) of the Act, 21 U.S.C. section 360bbb-3(b)(1), unless the authorization is terminated or revoked sooner. Performed at [facility], [address], [place], [place] [address]          Radiology Studies: No results found.      Scheduled Meds: . atorvastatin  40 mg Oral q1800  . finasteride  5 mg Oral Daily  . fluconazole  200 mg Oral Daily  . multivitamin with minerals  1 tablet Oral Daily  . sertraline  50 mg Oral QHS  . warfarin  3 mg Oral q1800  . Warfarin - Pharmacist Dosing Inpatient   Does not apply q1800   Continuous Infusions: . cefTRIAXone (ROCEPHIN)  IV 1 g (03/17/19 6045)  . dextrose 5 % and 0.45% NaCl 75 mL/hr at 03/17/19 0048     LOS: 3 days     Alwyn Ren, MD [facility] If 7PM-7AM, please contact night-coverage [email or web address] Password [email or web address] 03/17/2019, 11:20 AM

## 2019-03-17 NOTE — Progress Notes (Signed)
  ANTICOAGULATION CONSULT NOTE - Follow Up Consult  Pharmacy Consult for Coumadin  Indication: atrial fibrillation  No Known Allergies  Patient Measurements: Height: 6\' 1"  (185.4 cm) Weight: 191 lb (86.6 kg) IBW/kg (Calculated) : 79.9  Vital Signs: Temp: 97.7 F (36.5 C) (10/13 0845) Temp Source: Oral (10/13 0845) BP: 125/68 (10/13 0845) Pulse Rate: 58 (10/13 0845)  Labs: Recent Labs    03/15/19 1158 03/16/19 0605 03/17/19 0614  HGB 12.2* 11.7*  --   HCT 35.8* 35.2*  --   PLT 131* 164  --   LABPROT 22.4* 24.4* 22.9*  INR 2.0* 2.2* 2.1*  CREATININE 1.76* 1.50* 1.52*  CKTOTAL 783*  --   --     Estimated Creatinine Clearance: 43.1 mL/min (A) (by C-G formula based on SCr of 1.52 mg/dL (H)).  Assessment:  Anticoag: Warfarin PTA for Afib. Hgb 11.7 trending down. Plts 164 up. INR 2.1 in goal. - PTA dose: 3 mg daily   Goal of Therapy:  INR 2-3 Monitor platelets by anticoagulation protocol: Yes   Plan:  Warfarin 3 mg q1800 Daily INR Watch for increased INR with fluconazole + warfarin   Troy Gregory S. Alford Highland, PharmD, BCPS Clinical Staff Pharmacist Eilene Ghazi Stillinger 03/17/2019,9:37 AM

## 2019-03-17 NOTE — Care Management Important Message (Signed)
Important Message  Patient Details  Name: Troy Gregory MRN: [id number] Date of Birth: 25-Feb-1936   Medicare Important Message Given:  Yes     [name] 03/17/2019, 12:50 PM

## 2019-03-17 NOTE — Evaluation (Signed)
Occupational Therapy Evaluation Patient Details Name: Troy Gregory MRN: [id number] DOB: 09/19/1935 Today's Date: 03/17/2019    History of Present Illness 83 y.o. male with medical history significant of dementia, atrial fibrillation, chronic kidney disease stage III, coronary artery disease status post coronary artery bypass grafting, diastolic dysfunction CHF, history of maze procedure in 2013 who presents to the ER with confusion and generalized weakness.   Clinical Impression   Patient supine in bed and agreeable to OT evaluation.  Spouse present and assisted with PLOF/home setup information.  Spouse reports pt decline over the last week, but prior to was able to ambulate using cane and at times (depending on the day) needing assist with ADLs.  He is oriented to self only, follows 1 step commands inconsistently; limited eval due to agitation with mobility attempts.  Requires total assist for bed level self care at this time.  He will benefit from continued OT services while admitted and after dc at SNF level in order to maximize return to PLOF with ADLs/mobility and decrease burden of care.     Follow Up Recommendations  SNF;Supervision/Assistance - 24 hour    Equipment Recommendations  3 in 1 bedside commode    Recommendations for Other Services       Precautions / Restrictions Precautions Precautions: Fall Required Braces or Orthoses: Other Brace Other Brace: hand mitts Restrictions Weight Bearing Restrictions: No      Mobility Bed Mobility Overal bed mobility: Needs Assistance             General bed mobility comments: pt became agitated and began to swat at therapist when removing covers, pt declined participation in bed mobility and therapist deferred for safety   Transfers                 General transfer comment: deferred for safety/pt agitation    Balance                                           ADL either performed or assessed  with clinical judgement   ADL Overall ADL's : Needs assistance/impaired                                       General ADL Comments: requires total assist for self care at this time     Vision   Additional Comments: further assessment needed      Perception     Praxis      Pertinent Vitals/Pain Pain Assessment: No/denies pain     Hand Dominance     Extremity/Trunk Assessment Upper Extremity Assessment Upper Extremity Assessment: Generalized weakness;Difficult to assess due to impaired cognition   Lower Extremity Assessment Lower Extremity Assessment: Defer to PT evaluation       Communication Communication Communication: No difficulties   Cognition Arousal/Alertness: Awake/alert Behavior During Therapy: Impulsive;Agitated Overall Cognitive Status: History of cognitive impairments - at baseline Area of Impairment: Orientation;Attention;Following commands;Safety/judgement;Memory;Awareness;Problem solving                 Orientation Level: Disoriented to;Place;Time;Situation Current Attention Level: Focused Memory: Decreased short-term memory Following Commands: Follows one step commands inconsistently;Follows one step commands with increased time Safety/Judgement: Decreased awareness of safety;Decreased awareness of deficits Awareness: Intellectual Problem Solving: Slow processing;Decreased initiation;Difficulty sequencing;Requires verbal cues;Requires tactile cues General  Comments: pt with hx of dementia, but spouse reports worsening cognition within the last week; poor awareness and limited assessment due to agitation with limited engagement; pt unable to verbalize spouses name but recognized her    General Comments  spouse present and supportive; very open to SNF as she cannot assist him at this level of care    Exercises     Shoulder Instructions      Home Living Family/patient expects to be discharged to:: Private residence Living  Arrangements: Spouse/significant other Available Help at Discharge: Family Type of Home: House Home Access: Stairs to enter [employer] of Steps: 2 Entrance Stairs-Rails: (+ rail) Home Layout: One level;Laundry or work area in basement     [employer]: Producer, television/film/video: Standard     Home Equipment: [employer] - single point          Prior Functioning/Environment Level of Independence: Needs assistance  Gait / Transfers Assistance Needed: used cane for mobility  ADL's / Homemaking Assistance Needed: at times needed assist with dressing, assist with toileting at night (spouse reports getting lost in the house)    Comments: spouse has been assisting pt at home, present and provided PLOF         OT Problem List: Decreased strength;Decreased activity tolerance;Impaired balance (sitting and/or standing);Decreased safety awareness;Decreased cognition      OT Treatment/Interventions: Self-care/ADL training;DME and/or AE instruction;Therapeutic activities;Balance training;Patient/family education;Cognitive remediation/compensation    OT Goals(Current goals can be found in the care plan section) Acute Rehab OT Goals Patient Stated Goal: to get him to rehab OT Goal Formulation: With family Time For Goal Achievement: 03/31/19 Potential to Achieve Goals: Fair  OT Frequency: Min 2X/week   Barriers to D/C:            Co-evaluation              AM-PAC OT "6 Clicks" Daily Activity     Outcome Measure Help from another person eating meals?: Total Help from another person taking care of personal grooming?: Total Help from another person toileting, which includes using toliet, bedpan, or urinal?: Total Help from another person bathing (including washing, rinsing, drying)?: Total Help from another person to put on and taking off regular upper body clothing?: Total Help from another person to put on and taking off regular lower body clothing?:  Total 6 Click Score: 6   End of Session Nurse Communication: Mobility status  Activity Tolerance: Treatment limited secondary to agitation Patient left: in bed;with call bell/phone within reach;with bed alarm set;with nursing/sitter in room;with family/visitor present  OT Visit Diagnosis: Other abnormalities of gait and mobility (R26.89);Muscle weakness (generalized) (M62.81);Other symptoms and signs involving cognitive function                Time: 7517-0017 OT Time Calculation (min): 19 min Charges:  OT General Charges $OT Visit: 1 Visit OT Evaluation $OT Eval Moderate Complexity: 1 Mod  Chancy Milroy, [place] [facility] Pager [phone] Office [phone]'   Chancy Milroy 03/17/2019, 4:56 PM

## 2019-03-17 NOTE — Progress Notes (Signed)
Patient ID: SINGLETON HICKOX, male   DOB: 07/04/1935, 83 y.o.   MRN: [id number]  This NP spoke to bedside RN for patient update; he remains intermittently confused and agitated,  with poor po intake. I then spoke to Mrs Melle/wife/[phone] by telephone as a follow up to  yesterday's [place].  We had a  continued conversation regarding current medical situation and the important decisions she continues to face regarding treatment options decisions, advanced directive decisions and anticipatory care needs.          Comfort and dignity  remains a main focus of care.  Family is secure with decision for no artifical feeding now or in the future   She understands his high risk for decompensation, however she remains hopeful for improvement, and is hopeful for short term rehabilitation.    Discussed with wife  the importance of continued conversation with her  family and the  medical providers regarding overall plan of care and treatment options,  ensuring decisions are within the context of the patients values and GOCs.  Questions and concerns addressed   Emotional support offered.   PMT will continue to support holistically.    Total time spent on the unit was 20 minutes  Greater than 50% of the time was spent in counseling and coordination of care  Wadie Lessen NP  Palliative Medicine Team Team Phone # [phone] Pager [phone]

## 2019-03-18 DIAGNOSIS — R451 Restlessness and agitation: Secondary | ICD-10-CM

## 2019-03-18 DIAGNOSIS — R627 Adult failure to thrive: Secondary | ICD-10-CM

## 2019-03-18 LAB — PROTIME-INR
INR: 2 — ABNORMAL HIGH (ref 0.8–1.2)
Prothrombin Time: 22.3 seconds — ABNORMAL HIGH (ref 11.4–15.2)

## 2019-03-18 MED ORDER — WARFARIN SODIUM 5 MG PO TABS
5.0000 mg | ORAL_TABLET | Freq: Once | ORAL | Status: AC
  Administered 2019-03-18: 18:00:00 5 mg via ORAL
  Filled 2019-03-18: qty 1

## 2019-03-18 MED ORDER — CEPHALEXIN 250 MG PO CAPS: 250.0000 mg | ORAL_CAPSULE | Freq: Two times a day (BID) | ORAL | Status: DC

## 2019-03-18 MED ORDER — SODIUM CHLORIDE 0.9 % IV SOLN
1.0000 g | INTRAVENOUS | Status: AC
  Administered 2019-03-19: 06:00:00 1 g via INTRAVENOUS
  Filled 2019-03-18: qty 10

## 2019-03-18 MED ORDER — OLANZAPINE 5 MG PO TABS
2.5000 mg | ORAL_TABLET | Freq: Every day | ORAL | Status: DC
  Administered 2019-03-18: 2.5 mg via ORAL
  Filled 2019-03-18: qty 1

## 2019-03-18 NOTE — Discharge Instructions (Signed)

## 2019-03-18 NOTE — Progress Notes (Addendum)
PROGRESS NOTE    Troy AlanisJack P Gregory  ZOX:[id number] DOB: 01/05/1936 DOA: 03/13/2019 PCP: Marden NobleGates, Robert, MD    Brief Narrative: 83 y.o.malewith medical history significant ofdementia, atrial fibrillation, chronic kidney disease stage III, coronary artery disease status post coronary artery bypass grafting, diastolic dysfunction CHF, history of maze procedure in 2013 who presents to the ER with confusion and generalized weakness. Patient has been weak and lethargic all day. He is usually able to move around and does all his ADLs without difficulty. He is got history of dementia but still communicating adequately. Patient communicating even now and denying any chest pain, no fever no chills. Not sure of any sick contacts. Patient is oriented in person only but denies any dysuria. He was noted to have significant leukocytosis. CT head showed possible sinusitis. No evidence of UTI or pneumonia. Patient is being admitted with weakness and possibly some occult infection.  Hospital course last 7 days-patient was found to have E. coli UTI with ongoing confusion and not eating since admission.  He is being treated with Rocephin.  I have discussed with the son.  I tried to reach his wife but now was not able to reach her.  Palliative care was consulted.  His CODE STATUS was changed to DNR.   Assessment & Plan:   Principal Problem:   AMS (altered mental status) Active Problems:   PAF (paroxysmal atrial fibrillation) (HCC)   Hx of CABG 2013   Memory loss   CKD (chronic kidney disease) stage 3, GFR 30-59 ml/min   Chronic combined systolic and diastolic CHF-EF 81%55% Aug 2014   Pressure injury of skin   Generalized weakness   Palliative care by specialist   DNR (do not resuscitate)   Adult failure to thrive  #1E. coli UTI -patient with history of dementia admitted with increasing confusion and generalized weakness. He has not had anything to eat from the daily was admitted till today. He has  been put on IV fluids. Rocephin started 10/11 will dc 10/15  CT of the head showed no acute abnormality chronic microvascular ischemic changes noted with parenchymal volume loss and intracranial atherosclerosis. Concern for fungal allergic fungal sinusitisor sinonasal polyposis. Left maxillary left ethmoid left frontal sinus cyst with mural thickening worse since comparison exam.  Discussed with Dr. Suszanne Connerseoh ENT advised to start the patient on fluconazole and will need outpatient elective surgery to correct fungus ball in the sinuses. According to wife patient may not be able to tolerate this and does not want him to undergo any aggressive measures.   Appreciate palliative care input patient DNR.   #2 paroxysmal atrial fibrillation by history in normal sinus rhythm now heart rate in high 50s patient on Coreg 12.5 mg daily at home which is on hold.  Continue Coumadin INR today 2.0. Monitor INR on fluconazole.   #3 diastolic dysfunction stable patient is more on the dry side  #4 history of CAD and CABG patient is on Lipitor Norvasc Coreg lisinopril Coumadin at home. Restart Coumadin with history of paroxysmal A. fib.   #5 history of hypertension stable.  Pressure 131/99.  Since his blood pressure is trending up I will start him back on Norvasc 5 mg daily.  Patient also takes Coreg 12.5 and lisinopril 20 mg at home.  #6 history of dementia on Aricept at home  #7 depression on Zoloft at home restart Zoloft  #8 BPH continue Proscar  Pressure Injury 03/14/19 Coccyx Stage II -  Partial thickness loss of dermis presenting  as a shallow open ulcer with a red, pink wound bed without slough. (Active)  03/14/19 2100  Location: Coccyx  Location Orientation:   Staging: Stage II -  Partial thickness loss of dermis presenting as a shallow open ulcer with a red, pink wound bed without slough.  Wound Description (Comments):   Present on Admission: Yes      Nutrition Problem: Increased  nutrient needs Etiology: wound healing     Signs/Symptoms: estimated needs    Interventions: MVI, Magic cup  Estimated body mass index is 24.28 kg/m as calculated from the following:   Height as of this encounter:  (1.854 m).   Weight as of this encounter: 83.5 kg.   DVT prophylaxis:Coumadin Code Status:Full code Family Communication:No family at bedside called patient's wife no response then I called patient's son discussed with him.  Disposition Plan: PT recommends SNF  consultants:  Palliative care  Procedures:None Antimicrobials:Rocephin  Subjective:  Patient laying in bed pleasantly confused not eating breakfast he was able to tell me the address to his home but he does not know where he is not alert or oriented to person place or time. Objective: Vitals:   03/17/19 1950 03/18/19 0028 03/18/19 0615 03/18/19 0847  BP: (!) 142/95 (!) 132/99 (!) 162/89 137/82  Pulse: 61 63 61 65  Resp: Temp: 98 F (36.7 C) 98.3 F (36.8 C)  (!) 97.4 F (36.3 C)  TempSrc: Oral Oral  Oral  SpO2: 98% 100% 99%   Weight:   83.5 kg   Height:        Intake/Output Summary (Last 24 hours) at 03/18/2019 1032 Last data filed at 03/18/2019 1610 Gross per 24 hour  Intake 596.95 ml  Output 1750 ml  Net -1153.05 ml   Filed Weights   03/16/19 0500 03/17/19 0615 03/18/19 0615  Weight: 84.4 kg 86.6 kg 83.5 kg    Examination:  General exam: Appears calm and comfortable  Respiratory system: Clear to auscultation. Respiratory effort normal. Cardiovascular system: S1 & S2 heard, RRR. No JVD, murmurs, rubs, gallops or clicks. No pedal edema. Gastrointestinal system: Abdomen is nondistended, soft and nontender. No organomegaly or masses felt. Normal bowel sounds heard. Central nervous system: Alert and oriented. No focal neurological deficits. Extremities: Symmetric 5 x 5 power. Skin: No rashes, lesions or ulcers Psychiatry: Judgement and insight appear normal.  Mood & affect appropriate.     Data Reviewed: I have personally reviewed following labs and imaging studies  CBC: Recent Labs  Lab 03/13/19 2103 03/14/19 0350 03/15/19 1158 03/16/19 0605  WBC 19.1* 17.4* 14.1* 9.0  NEUTROABS 16.7*  --   --  7.6  HGB 12.8* 12.4* 12.2* 11.7*  HCT 40.0 38.1* 35.8* 35.2*  MCV 94.3 94.8 91.3 91.4  PLT 167 136* 131* 164   Basic Metabolic Panel: Recent Labs  Lab 03/13/19 2346 03/14/19 0350 03/15/19 1158 03/16/19 0605 03/17/19 0614  NA 143 141 138 139 140  K 4.5 4.1 3.9 3.0* 4.2  CL 105 105 106 105 105  CO2 GLUCOSE 157* 157* 130* 113* 107*  BUN 21 22 26* 20 16  CREATININE 2.29* 2.09* 1.76* 1.50* 1.52*  CALCIUM 8.9 9.0 9.0 8.7* 9.1  MG  --   --   --  1.6* 2.2   GFR: Estimated Creatinine Clearance: 43.1 mL/min (A) (by C-G formula based on SCr of 1.52 mg/dL (H)). Liver Function Tests: Recent Labs  Lab 03/13/19 2346 03/14/19 0350  03/15/19 1158 03/16/19 0605  AST 34 29 66* 73*  ALT 19 17 28  42  ALKPHOS 73 68 76 69  BILITOT 1.0 1.3* 0.8 0.9  PROT 6.5 6.7 6.2* 6.9  ALBUMIN 3.2* 3.0* 2.8* 2.6*   No results for input(s): LIPASE, AMYLASE in the last 168 hours. No results for input(s): AMMONIA in the last 168 hours. Coagulation Profile: Recent Labs  Lab 03/15/19 1158 03/16/19 0605 03/17/19 0614 03/18/19 0554  INR 2.0* 2.2* 2.1* 2.0*   Cardiac Enzymes: Recent Labs  Lab 03/15/19 1158  CKTOTAL 783*   BNP (last 3 results) No results for input(s): PROBNP in the last 8760 hours. HbA1C: No results for input(s): HGBA1C in the last 72 hours. CBG: Recent Labs  Lab 03/13/19 2159  GLUCAP 152*   Lipid Profile: No results for input(s): CHOL, HDL, LDLCALC, TRIG, CHOLHDL, LDLDIRECT in the last 72 hours. Thyroid Function Tests: No results for input(s): TSH, T4TOTAL, FREET4, T3FREE, THYROIDAB in the last 72 hours. Anemia Panel: No results for input(s): VITAMINB12, FOLATE, FERRITIN, TIBC, IRON, RETICCTPCT in the last 72  hours. Sepsis Labs: No results for input(s): PROCALCITON, LATICACIDVEN in the last 168 hours.  Recent Results (from the past 240 hour(s))  Urine culture     Status: Abnormal   Collection Time: 03/13/19  9:03 PM   Specimen: Urine, Random  Result Value Ref Range Status   Specimen Description URINE, RANDOM  Final   Special Requests   Final    NONE Performed at [facility], [address] [place], [place]    Culture >=100,000 COLONIES/mL ESCHERICHIA COLI (A)  Final   Report Status 03/16/2019 FINAL  Final   Organism ID, Bacteria ESCHERICHIA COLI (A)  Final      Susceptibility   Escherichia coli - MIC*    AMPICILLIN 4 SENSITIVE Sensitive     CEFAZOLIN <=4 SENSITIVE Sensitive     CEFTRIAXONE <=1 SENSITIVE Sensitive     CIPROFLOXACIN <=0.25 SENSITIVE Sensitive     GENTAMICIN <=1 SENSITIVE Sensitive     IMIPENEM <=0.25 SENSITIVE Sensitive     NITROFURANTOIN <=16 SENSITIVE Sensitive     TRIMETH/SULFA <=20 SENSITIVE Sensitive     AMPICILLIN/SULBACTAM <=2 SENSITIVE Sensitive     PIP/TAZO <=4 SENSITIVE Sensitive     Extended ESBL NEGATIVE Sensitive     * >=100,000 COLONIES/mL ESCHERICHIA COLI  SARS CORONAVIRUS 2 (TAT 6-24 HRS) Nasopharyngeal Nasopharyngeal Swab     Status: None   Collection Time: 03/14/19 12:14 AM   Specimen: Nasopharyngeal Swab  Result Value Ref Range Status   SARS Coronavirus 2 NEGATIVE NEGATIVE Final    Comment: (NOTE) SARS-CoV-2 target nucleic acids are NOT DETECTED. The SARS-CoV-2 RNA is generally detectable in upper and lower respiratory specimens during the acute phase of infection. Negative results do not preclude SARS-CoV-2 infection, do not rule out co-infections with other pathogens, and should not be used as the sole basis for treatment or other patient management decisions. Negative results must be combined with clinical observations, patient history, and epidemiological information. The expected result is Negative. Fact Sheet for  Patients: 05/14/19 Fact Sheet for Healthcare Providers: HairSlick.no This test is not yet approved or cleared by the quierodirigir.com FDA and  has been authorized for detection and/or diagnosis of SARS-CoV-2 by FDA under an Emergency Use Authorization (EUA). This EUA will remain  in effect (meaning this test can be used) for the duration of the COVID-19 declaration under Section 56 4(b)(1) of the Act, 21 U.S.C. section  360bbb-3(b)(1), unless the authorization is terminated or revoked sooner. Performed at [facility], [place] [address], [place], [place] [address]          Radiology Studies: No results found.      Scheduled Meds: . atorvastatin  40 mg Oral q1800  . finasteride  5 mg Oral Daily  . fluconazole  200 mg Oral Daily  . multivitamin with minerals  1 tablet Oral Daily  . sertraline  50 mg Oral QHS  . warfarin  5 mg Oral ONCE-1800  . Warfarin - Pharmacist Dosing Inpatient   Does not apply q1800   Continuous Infusions: . cefTRIAXone (ROCEPHIN)  IV 1 g (03/18/19 0657)  . dextrose 5 % and 0.45% NaCl 75 mL/hr at 03/17/19 0048     LOS: 4 days     Georgette Shell, MD [facility]  If 7PM-7AM, please contact night-coverage [email or web address] Password [email or web address] 03/18/2019, 10:32 AM

## 2019-03-18 NOTE — Plan of Care (Signed)
  Problem: Education: Goal: Knowledge of General Education information will improve Description Including pain rating scale, medication(s)/side effects and non-pharmacologic comfort measures Outcome: Progressing   

## 2019-03-18 NOTE — Progress Notes (Signed)
  ANTICOAGULATION CONSULT NOTE - Follow Up Consult  Pharmacy Consult for Coumadin  Indication: atrial fibrillation  No Known Allergies  Patient Measurements: Height: 6\' 1"  (185.4 cm) Weight: 184 lb (83.5 kg) IBW/kg (Calculated) : 79.9  Vital Signs: Temp: 97.4 F (36.3 C) (10/14 0847) Temp Source: Oral (10/14 0847) BP: 137/82 (10/14 0847) Pulse Rate: 65 (10/14 0847)  Labs: Recent Labs    03/15/19 1158 03/16/19 0605 03/17/19 0614 03/18/19 0554  HGB 12.2* 11.7*  --   --   HCT 35.8* 35.2*  --   --   PLT 131* 164  --   --   LABPROT 22.4* 24.4* 22.9* 22.3*  INR 2.0* 2.2* 2.1* 2.0*  CREATININE 1.76* 1.50* 1.52*  --   CKTOTAL 783*  --   --   --     Estimated Creatinine Clearance: 43.1 mL/min (A) (by C-G formula based on SCr of 1.52 mg/dL (H)).  Assessment:  Anticoag: Warfarin PTA for Afib. Hgb 11.7 trending down. Plts 164 up. INR 2 in goal but drifting down. - PTA dose: 3 mg daily   Goal of Therapy:  INR 2-3 Monitor platelets by anticoagulation protocol: Yes   Plan:  Increase Warfarin 5mg  po x 1 tonight Daily INR Watch for increased INR with fluconazole + warfarin   Catheryne Deford S. [place], PharmD, BCPS Clinical Staff Pharmacist Eilene Ghazi Stillinger 03/18/2019,9:51 AM

## 2019-03-18 NOTE — Progress Notes (Signed)
Physical Therapy Treatment Patient Details Name: Troy Gregory MRN: [id number] DOB: 1936-04-26 Today's Date: 03/18/2019    History of Present Illness 83 y.o. male with medical history significant of dementia, atrial fibrillation, chronic kidney disease stage III, coronary artery disease status post coronary artery bypass grafting, diastolic dysfunction CHF, history of maze procedure in 2013 who presents to the ER with confusion and generalized weakness.    PT Comments    Pt able to ambulate with assist of 2; needs cues for safety.  Continues to have poor balance and high fall risk.  Continue to recommend SNF.   Follow Up Recommendations  SNF     Equipment Recommendations  Other (comment)    Recommendations for Other Services       Precautions / Restrictions Precautions Precautions: Fall Other Brace: hand mitts    Mobility  Bed Mobility Overal bed mobility: Needs Assistance       Supine to sit: Mod assist;+2 for safety/equipment;HOB elevated Sit to supine: Mod assist;+2 for safety/equipment;HOB elevated   General bed mobility comments: needs frequent cues, assist to initiate transfers  Transfers   Equipment used: Rolling walker (2 wheeled)   Sit to Stand: Mod assist;+2 safety/equipment;+2 physical assistance         General transfer comment: cues to lean forward to stand and to reach for walker once standing  Ambulation/Gait Ambulation/Gait assistance: Mod assist;+2 physical assistance;+2 safety/equipment Gait Distance (Feet): 60 Feet Assistive device: Rolling walker (2 wheeled) Gait Pattern/deviations: Scissoring;Decreased stride length;Antalgic     General Gait Details: cues to stay close to walker, keep hands on walker, assist to turn walker, cued for increased BOS   Stairs             Wheelchair Mobility    Modified Rankin (Stroke Patients Only)       Balance   Sitting-balance support: Feet supported Sitting balance-Leahy Scale:  Poor Sitting balance - Comments: posterior lean, cues to lean forward frequently Postural control: Posterior lean Standing balance support: Bilateral upper extremity supported Standing balance-Leahy Scale: Poor Standing balance comment: mod-max A for standing with RW; tended to lean left and posterior                            Cognition Arousal/Alertness: Awake/alert Behavior During Therapy: Impulsive Overall Cognitive Status: Impaired/Different from baseline Area of Impairment: Orientation;Attention;Following commands;Safety/judgement;Memory;Awareness;Problem solving                 Orientation Level: Disoriented to;Place;Time;Situation Current Attention Level: Focused Memory: Decreased short-term memory Following Commands: Follows one step commands inconsistently;Follows one step commands with increased time Safety/Judgement: Decreased awareness of safety;Decreased awareness of deficits            Exercises      General Comments General comments (skin integrity, edema, etc.): son present and supportive      Pertinent Vitals/Pain Pain Assessment: Faces Faces Pain Scale: Hurts little more Pain Location: L knee w ambulation Pain Descriptors / Indicators: Guarding;Grimacing Pain Intervention(s): Limited activity within patient's tolerance;Monitored during session    Home Living                      Prior Function            PT Goals (current goals can now be found in the care plan section) Acute Rehab PT Goals Potential to Achieve Goals: Fair Progress towards PT goals: Progressing toward goals    Frequency  Min 2X/week      PT Plan Current plan remains appropriate    Co-evaluation              AM-PAC PT "6 Clicks" Mobility   Outcome Measure  Help needed turning from your back to your side while in a flat bed without using bedrails?: A Little Help needed moving from lying on your back to sitting on the side of a flat  bed without using bedrails?: A Lot Help needed moving to and from a bed to a chair (including a wheelchair)?: A Lot Help needed standing up from a chair using your arms (e.g., wheelchair or bedside chair)?: A Lot Help needed to walk in hospital room?: A Lot Help needed climbing 3-5 steps with a railing? : Total 6 Click Score: 12    End of Session Equipment Utilized During Treatment: Gait belt Activity Tolerance: Patient limited by pain(limited by cognition) Patient left: in bed;with family/visitor present;with restraints reapplied;with bed alarm set;with call bell/phone within reach Nurse Communication: Other (comment)(condom cath removed) PT Visit Diagnosis: Other abnormalities of gait and mobility (R26.89);Other symptoms and signs involving the nervous system (R29.898);Muscle weakness (generalized) (M62.81)     Time: 8676-1950 PT Time Calculation (min) (ACUTE ONLY): 25 min  Charges:  $Gait Training: 8-22 mins $Therapeutic Activity: 8-22 mins                     Karlton Lemon, PT Acute Rehabilitation Services [phone]   Karlton Lemon 03/18/2019, 3:27 PM

## 2019-03-18 NOTE — Progress Notes (Signed)
Patient ID: Troy Gregory, male   DOB: Jun 09, 1935, 83 y.o.   MRN: [id number]  This NP spoke to bedside RN for patient update; he remains intermittently confused and agitated,  with poor po intake.  I discussed case with Dr Zigmund Daniel in detail.  I then spoke to Mrs Botto/wife/[phone] by telephone for continued conversation regarding current medical situation and GOCs.  We had a  continued conversation regarding current medical situation,  we discussed the concept of failure to thrive and the limitations of medical interventions to prolong quality of life when a body does begins to fail to thrive.  We discussed the natural trajectory of the terminal disease of dementia.         Comfort and dignity  remains a main focus of care.  Family is secure with decision for no artifical feeding now or in the future    Patient's wife verbalizes her understanding of the long  term poor prognosis and her desire for comfort and dignity.  She understands his high risk for decompensation, and now believes that taking him home with hospice services is in his best interest.   Plan of care: -DNR/DNI -no artificial feeding or hydration now or in the future/diet as tolerated -continue IV antibiotics until discharge then  readdress antibiotic use when infection occurs -no surgical interventions -minimize medications -avoid hospitalizations -symptom management to enhance comfort      - Zyprexa 2.5 mg qhs for agitation   -hospice services at home on discharge  Discussed with wife  the importance of continued conversation with her  family and the  medical providers regarding overall plan of care and treatment options,  ensuring decisions are within the context of the patients values and GOCs.  Questions and concerns addressed   Emotional support offered.   PMT will continue to support holistically.    Discussed with Dr Zigmund Daniel  Total time spent on the unit was 45 minutes minutes  Greater than 50% of  the time was spent in counseling and coordination of care  Wadie Lessen NP  Palliative Medicine Team Team Phone # [phone] Pager [phone]

## 2019-03-18 NOTE — Progress Notes (Signed)
Nutrition Follow-up  DOCUMENTATION CODES:   Not applicable  INTERVENTION:   -Continue Magic cup TID with meals, each supplement provides 290 kcal and 9 grams of protein  NUTRITION DIAGNOSIS:   Increased nutrient needs related to wound healing as evidenced by estimated needs.  Ongoing  GOAL:   Patient will meet greater than or equal to 90% of their needs  Unmet  MONITOR:   PO intake, Supplement acceptance, Weight trends, Skin, I & O's  REASON FOR ASSESSMENT:   Malnutrition Screening Tool    ASSESSMENT:   Troy Gregory is a 83 y.o. male with medical history significant of dementia, atrial fibrillation, chronic kidney disease stage III, coronary artery disease status post coronary artery bypass grafting, diastolic dysfunction CHF, history of maze procedure in 2013 who presents to the ER with confusion and generalized weakness.  Patient has been weak and lethargic all day.  He is usually able to move around and does all his ADLs without difficulty.  He is got history of dementia but still communicating adequately.  Patient communicating even now and denying any chest pain, no fever no chills.  Not sure of any sick contacts.  Patient is oriented in person only but denies any dysuria.  He was noted to have significant leukocytosis.  CT head showed possible sinusitis.  No evidence of UTI or pneumonia.  Patient is being admitted with weakness and possibly some occult infection.  Reviewed I/O's: -688 ml x 24 hours and +2.7 L since admission  UOP: 1.8 L x 24 hours   Pt resting in bed at time of visit, in hand restraints. Pt moving around in bed and appeared agitated; RD deferred nutrition-focused physical exam in respect for pt's rest and safety.   Pt still with poor oral intake; noted meal completion 10-50%. Per MD notes, pt refused to eat yesterday.   Palliative care following; focus of care is on comfort and dignity. Pt and family do not desire artifical feeding.   Plan for SNF  once medically stable.   Labs reviewed.   Diet Order:   Diet Order            DIET DYS 3 Room service appropriate? Yes; Fluid consistency: Thin  Diet effective now              EDUCATION NEEDS:   No education needs have been identified at this time  Skin:  Skin Assessment: Skin Integrity Issues: Skin Integrity Issues:: Stage II Stage II: coccyx  Last BM:  03/17/19  Height:   Ht Readings from Last 1 Encounters:  03/15/19 6\' 1"  (1.854 m)    Weight:   Wt Readings from Last 1 Encounters:  03/18/19 83.5 kg    Ideal Body Weight:  83.6 kg  BMI:  Body mass index is 24.28 kg/m.  Estimated Nutritional Needs:   Kcal:  1900-2100  Protein:  95-110 grams  Fluid:  > 1.9 L    Troy Gregory A. Jimmye Norman, RD, LDN, [place] Registered Dietitian II Certified Diabetes Care and Education Specialist Pager: [phone] After hours Pager: [phone]

## 2019-03-19 LAB — BASIC METABOLIC PANEL
Anion gap: 11 (ref 5–15)
BUN: 12 mg/dL (ref 8–23)
CO2: 21 mmol/L — ABNORMAL LOW (ref 22–32)
Calcium: 9 mg/dL (ref 8.9–10.3)
Chloride: 105 mmol/L (ref 98–111)
Creatinine, Ser: 1.58 mg/dL — ABNORMAL HIGH (ref 0.61–1.24)
GFR calc Af Amer: 47 mL/min — ABNORMAL LOW (ref >60)
GFR calc non Af Amer: 40 mL/min — ABNORMAL LOW (ref >60)
Glucose, Bld: 113 mg/dL — ABNORMAL HIGH (ref 70–99)
Potassium: 3.3 mmol/L — ABNORMAL LOW (ref 3.5–5.1)
Sodium: 137 mmol/L (ref 135–145)

## 2019-03-19 LAB — NOVEL CORONAVIRUS, NAA (HOSP ORDER, SEND-OUT TO REF LAB; TAT 18-24 HRS): SARS-CoV-2, NAA: NOT DETECTED

## 2019-03-19 LAB — CBC
HCT: 37.4 % — ABNORMAL LOW (ref 39.0–52.0)
Hemoglobin: 12.3 g/dL — ABNORMAL LOW (ref 13.0–17.0)
MCH: 30 pg (ref 26.0–34.0)
MCHC: 32.9 g/dL (ref 30.0–36.0)
MCV: 91.2 fL (ref 80.0–100.0)
Platelets: 200 10*3/uL (ref 150–400)
RBC: 4.1 MIL/uL — ABNORMAL LOW (ref 4.22–5.81)
RDW: 12.9 % (ref 11.5–15.5)
WBC: 6.1 10*3/uL (ref 4.0–10.5)
nRBC: 0 % (ref 0.0–0.2)

## 2019-03-19 LAB — PROTIME-INR
INR: 2.1 — ABNORMAL HIGH (ref 0.8–1.2)
Prothrombin Time: 23 seconds — ABNORMAL HIGH (ref 11.4–15.2)

## 2019-03-19 LAB — CK: Total CK: 294 U/L (ref 49–397)

## 2019-03-19 MED ORDER — FLUCONAZOLE 200 MG PO TABS: 200.0000 mg | ORAL_TABLET | Freq: Every day | ORAL | 0 refills | Status: AC

## 2019-03-19 MED ORDER — POTASSIUM CHLORIDE CRYS ER 20 MEQ PO TBCR
40.0000 meq | EXTENDED_RELEASE_TABLET | Freq: Once | ORAL | Status: AC
  Administered 2019-03-19: 40 meq via ORAL
  Filled 2019-03-19: qty 2

## 2019-03-19 MED ORDER — OLANZAPINE 2.5 MG PO TABS: 2.5000 mg | ORAL_TABLET | Freq: Every day | ORAL | 0 refills | Status: AC

## 2019-03-19 NOTE — TOC Transition Note (Signed)
Transition of Care [facility]) - CM/SW Discharge Note   Patient Details  Name: Troy Gregory MRN: [id number] Date of Birth: 1935-09-20  Transition of Care [facility]) CM/SW Contact:  Zenon Mayo, RN Phone Number: 03/19/2019, 2:11 PM   Clinical Narrative:    Patient for dc today, NCM spoke with wife, she states she and her son will transport patient by car,  NCM informed her that would call [facility] for transport, wife insisted that she and son transport by car.  She will be here to transport patient when the DME has been delivered to the house.  She states it will be there in 20 minutes.   Final next level of care: Home w Hospice Care Barriers to Discharge: No Barriers Identified   Patient Goals and CMS Choice Patient states their goals for this hospitalization and ongoing recovery are:: home with hospice CMS Medicare.gov Compare Post Acute Care list provided to:: Patient Represenative (must comment)(wife) Choice offered to / list presented to : Spouse  Discharge Placement                       Discharge Plan and Services In-house Referral: Hospice / Palliative Care Discharge Planning Services: CM Consult Post Acute Care Choice: Hospice          DME Arranged: ([facility] hospice is supplying DME)         HH Arranged: RN [place]: Hospice and [place] Date [place]: 03/19/19 Time [place]: 1411 Representative spoke with at [place]: [place] ([place]) Interventions     Readmission Risk Interventions No flowsheet data found.

## 2019-03-19 NOTE — Progress Notes (Signed)
[facility]  Received request from Wadie Lessen, NP, confirmed by [facility] manager Neoma Laming, for family interest in hospice services at home after discharge. Chart reviewed and Hospice eligibility confirmed by hospice physician.   Spoke with spouse by phone to confirm interest and answer questions. She verbalized good understanding and requested hospital bed with top rails, overbed table and bedside commonde to be delivered to home prior to discharge this afternoon. She tells me she hopes to be able to transport patient by car. Discharge address confirmed as correct in Epic. Spouse is contact for DME delivery to home.   Please send out of facility DNR home with patient.   Please send scripts for new medications.   [facility] referral center specialist will follow up with family to arrange visit in home.   Please do not hesitate to call with hospice questions.   Left voice message for Neoma Laming regarding above.  Thank you,  Erling Conte, LCSW [phone]

## 2019-03-19 NOTE — Discharge Summary (Signed)
Physician Discharge Summary  Troy Gregory QQP:[id number] DOB: 1935-06-28 DOA: 03/13/2019  PCP: Josetta Huddle, MD  Admit date: 03/13/2019 Discharge date: 03/19/2019  Admitted From: home Disposition:  Home w hospice  Recommendations for Outpatient Follow-up:  1. Follow up with PCP in in a few days regarding discussion about continuation of further medication during anticoagulation   Home Health: yes- hospice  Equipment/Devices: none  Discharge Condition: Stable-prognosis guarded CODE STATUS: DNR/DNI Diet recommendation: Regular diet Brief/Interim Summary:  Brief Narrative: 83 y.o.malewith medical history significant ofdementia, atrial fibrillation, chronic kidney disease stage III, coronary artery disease status post coronary artery bypass grafting, diastolic dysfunction CHF, history of maze procedure in 2013 who presents to the ER with confusion and generalized weakness. Patient has been weak and lethargic all day. He is usually able to move around and does all his ADLs without difficulty. He is got history of dementia but still communicating adequately. Patient communicating even now and denying any chest pain, no fever no chills. Not sure of any sick contacts. Patient is oriented in person only but denies any dysuria. He was noted to have significant leukocytosis. CT head showed possible sinusitis. No evidence of UTI or pneumonia. Patient is being admitted with weakness and possibly some occult infection. Hospital course last 5 days-patient was found to have E. coli UTI with ongoing confusion and not eating since admission.  He is being treated with Rocephin.Palliative care was consulted.  His CODE STATUS was changed to DNR. Completed antibiotic 10/15. Overall remains alert awake but confused with baseline mental status and dementia At this time family has opted for home with hospice.  Subjective:  [place] on, alert, not following commands  Assessment & Plan:   E.  coli UTI -patient with history of dementia admitted with increasing confusion and generalized weakness.Rocephin started 10/11 completing on 10/15  Worsening confusion suspecting acute metabolic encephalopathy secondary to UTI in the background of  baseline dementia:CT of the head showed no acute abnormality chronic microvascular ischemic changes noted with parenchymal volume loss and intracranial atherosclerosis. Appreciate palliative care input patient DNR.  Palliative care discussion with patient's wife and family and their goal is comfort and dignity-family is forming the decision for no artificial feeding and at this time is planning to take patient home with hospice. added zyprexa 2.5 mg qhs  Allergic fungal sinusitis:Concern for fungal allergic fungal sinusitisor sinonasal polyposis.Left maxillary left ethmoid left frontal sinus cyst with mural thickening worse since comparison exam. Discussed with Dr. Benjamine Mola ENT advised to start the patient on fluconazole and will need outpatient elective surgery to correct fungus ball in the sinuses. According to wife patient may not be able to tolerate this and does not want him to undergo any aggressive measures.   paroxysmal atrial fibrillation by history in normal sinus rhythm now heart rate in high 50s patient on Coreg 12.5 mg daily at home which is on hold.Continue Coumadin INR today 2.0.Monitor INR on fluconazole.   diastolic dysfunction stable patient is more on the dry side  history of CAD and CABG patient is on Lipitor Norvasc Coreg lisinopril Coumadin at home.  Continue home meds.  history of hypertension stable blood pressure.  Blood pressure is trending up continue home medication follow-up with PCP.  history of dementia on Aricept at home depression on Zoloft at home restart Zoloft BPH continue Proscar  I discussed with patient's wife in detail, she would like the patient come home with hospice and okay for discharge  today.  Nutrition: Nutrition Problem:  Increased nutrient needs Etiology: wound healing Signs/Symptoms: estimated needs Interventions: MVI, Magic cup  Pressure Ulcer: Pressure Injury 03/14/19 Coccyx Stage II -  Partial thickness loss of dermis presenting as a shallow open ulcer with a red, pink wound bed without slough. (Active)  03/14/19 2100  Location: Coccyx  Location Orientation:   Staging: Stage II -  Partial thickness loss of dermis presenting as a shallow open ulcer with a red, pink wound bed without slough.  Wound Description (Comments):   Present on Admission: Yes    Body mass index is 24.94 kg/m.    DVT prophylaxis: warfarin Code Status: DNR/DNI Family Communication: Discussed with patient's wife in details. Disposition Plan: Home with hospice  Once  Hospice and equipment is set up at home.    Consultants: Pallitaive  Procedures:None  Microbiology: Ceftriaxone completed 10/15 Fluconazole 10/13>>   Discharge Diagnoses:  Principal Problem:   AMS (altered mental status) Active Problems:   PAF (paroxysmal atrial fibrillation) (HCC)   Hx of CABG 2013   Memory loss   CKD (chronic kidney disease) stage 3, GFR 30-59 ml/min   Chronic combined systolic and diastolic CHF-EF 09% Aug 2014   Pressure injury of skin   Generalized weakness   Palliative care by specialist   DNR (do not resuscitate)   Adult failure to thrive   Agitation    Discharge Instructions  Discharge Instructions    Diet general   Complete by: As directed    Discharge instructions   Complete by: As directed    Please follow-up with your primary care doctor regarding your multiple home medication including anticoagulations, and blood pressure medication   Increase activity slowly   Complete by: As directed      Allergies as of 03/19/2019   No Known Allergies     Medication List    STOP taking these medications   nitrofurantoin (macrocrystal-monohydrate) 100 MG capsule Commonly  known as: Macrobid     TAKE these medications   amLODipine 5 MG tablet Commonly known as: NORVASC Take 5 mg by mouth daily.   atorvastatin 40 MG tablet Commonly known as: LIPITOR TAKE 1 TABLET BY MOUTH DAILY AFTER BREAKFAST What changed:   how much to take  how to take this  when to take this  additional instructions   carvedilol 12.5 MG tablet Commonly known as: COREG Take 12.5 mg by mouth daily. What changed: Another medication with the same name was removed. Continue taking this medication, and follow the directions you see here.   donepezil 10 MG tablet Commonly known as: ARICEPT Take 10 mg by mouth at bedtime.   finasteride 5 MG tablet Commonly known as: PROSCAR Take 5 mg by mouth daily.   fluconazole 200 MG tablet Commonly known as: DIFLUCAN Take 1 tablet (200 mg total) by mouth daily for 6 doses. Start taking on: March 20, 2019   lisinopril 20 MG tablet Commonly known as: ZESTRIL Take 20 mg by mouth daily.   OLANZapine 2.5 MG tablet Commonly known as: ZYPREXA Take 1 tablet (2.5 mg total) by mouth at bedtime.   potassium chloride SA 20 MEQ tablet Commonly known as: KLOR-CON TAKE 1/2 TABLET BY MOUTH TWICE DAILY   sertraline 50 MG tablet Commonly known as: ZOLOFT Take 50 mg by mouth at bedtime.   warfarin 3 MG tablet Commonly known as: Coumadin Take as directed by coumadin clinic What changed:   how much to take  how to take this  when to take this  additional instructions  Contact information for follow-up providers    Marden Noble, MD Follow up.   Specialty: Internal Medicine Why: Regarding multiple home medications Contact information: [address] [place] [address] [phone]            Contact information for after-discharge care    Destination    [facility] Preferred SNF .   Service: Skilled Nursing Contact information: [address] [place]  [address] [phone]                 No Known Allergies  Procedures/Studies: Ct Head Wo Contrast  Result Date: 03/13/2019 CLINICAL DATA:  Altered level of consciousness, increasing lethargy, confusion and weakness EXAM: CT HEAD WITHOUT CONTRAST TECHNIQUE: Contiguous axial images were obtained from the base of the skull through the vertex without intravenous contrast. COMPARISON:  CT head 03/31/2017 FINDINGS: Brain: No evidence of acute infarction, hemorrhage, hydrocephalus, extra-axial collection or mass lesion/mass effect. Symmetric prominence of the ventricles, cisterns and sulci compatible with parenchymal volume loss. Pattern of volume loss appears frontal predominant. Patchy areas of white matter hypoattenuation are most compatible with chronic microvascular angiopathy. Vascular: Atherosclerotic calcification of the carotid siphons and intradural vertebral arteries. No hyperdense vessel. Skull: No calvarial fracture or suspicious osseous lesion. No scalp swelling or hematoma. Sinuses/Orbits: There is nodular hypodense mural thickening throughout the left maxillary sinus, left ethmoids and left frontal sinuses with more central hyperattenuation. Slightly expansile appearance of this sinonasal thickening with some thinning of the left maxillary antrum. No abnormal stranding or thickening in the retroantral fat. Poor dentition with a periapical lucency of the left maxillary first molar. Included orbital structures are unremarkable. Other: None IMPRESSION: 1. No acute intracranial abnormality. 2. Chronic microvascular ischemic changes as well as frontal predominant parenchymal volume loss, and intracranial atherosclerosis. 3. Nodular hypodense mural thickening throughout the left maxillary sinus, left ethmoids and left frontal sinuses with more central hyperattenuation. Worsening since comparison exam. 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  Slightly expansile appearance of this sinonasal thickening with some thinning of the left maxillary antrum, therefore mucocele is not fully excluded. 4. Poor dentition with a periapical lucency of the left maxillary first molar. Correlate with dental examination. Electronically Signed   By: Kreg Shropshire M.D.   On: 03/13/2019 23:21   Dg Chest Portable 1 View  Result Date: 03/13/2019 CLINICAL DATA:  Pt brought to ED by [facility] from home live with the wife. Per family pt is been moore lethargic, confuse and weak all day today. Hx of dementia, but mostly able to walk around and take care of himself. EXAM: PORTABLE CHEST 1 VIEW COMPARISON:  Chest radiograph 08/11/2014 FINDINGS: Stable cardiomediastinal contours status post median sternotomy and CABG. Low lung volumes with bronchovascular crowding and scattered linear opacities likely reflecting atelectasis. No focal infiltrate to suggest infection. No pneumothorax or large pleural effusion. Degenerative changes are seen in the right shoulder. IMPRESSION: Low lung volumes with bronchovascular crowding and probable scattered atelectasis. No focal infiltrate to suggest infection. Electronically Signed   By: Emmaline Kluver M.D.   On: 03/13/2019 21:27    Discharge Exam: Vitals:   03/19/19 0546 03/19/19 0841  BP: (!) 148/80 (!) 155/125  Pulse: (!) 56 (!) 56  Resp: 18 16  Temp: 98.2 F (36.8 C) (!) 97.5 F (36.4 C)  SpO2: 97% 100%   Vitals:   03/18/19 1715 03/18/19 1942 03/19/19 0546 03/19/19 0841  BP: (!) 146/98 Marland Kitchen)  157/91 (!) 148/80 (!) 155/125  Pulse: (!) 53 (!) 54 (!) 56 (!) 56  Resp:  18 18 16   Temp:  (!) 97.4 F (36.3 C) 98.2 F (36.8 C) (!) 97.5 F (36.4 C)  TempSrc:  Oral Oral Oral  SpO2:  100% 97% 100%  Weight:   85.7 kg   Height:        General: Pt is alert, awake oriented x1-0 awake, not in acute distress Cardiovascular: RRR, S1/S2 +, no rubs, no gallops Respiratory: CTA bilaterally, no wheezing, no rhonchi Abdominal: Soft, NT, ND,  bowel sounds + Extremities: no edema, no cyanosis   The results of significant diagnostics from this hospitalization (including imaging, microbiology, ancillary and laboratory) are listed below for reference.     Microbiology: Recent Results (from the past 240 hour(s))  Urine culture     Status: Abnormal   Collection Time: 03/13/19  9:03 PM   Specimen: Urine, Random  Result Value Ref Range Status   Specimen Description URINE, RANDOM  Final   Special Requests   Final    NONE Performed at [facility], [address] [place], [place] [address]    Culture >=100,000 COLONIES/mL ESCHERICHIA COLI (A)  Final   Report Status 03/16/2019 FINAL  Final   Organism ID, Bacteria ESCHERICHIA COLI (A)  Final      Susceptibility   Escherichia coli - MIC*    AMPICILLIN 4 SENSITIVE Sensitive     CEFAZOLIN <=4 SENSITIVE Sensitive     CEFTRIAXONE <=1 SENSITIVE Sensitive     CIPROFLOXACIN <=0.25 SENSITIVE Sensitive     GENTAMICIN <=1 SENSITIVE Sensitive     IMIPENEM <=0.25 SENSITIVE Sensitive     NITROFURANTOIN <=16 SENSITIVE Sensitive     TRIMETH/SULFA <=20 SENSITIVE Sensitive     AMPICILLIN/SULBACTAM <=2 SENSITIVE Sensitive     PIP/TAZO <=4 SENSITIVE Sensitive     Extended ESBL NEGATIVE Sensitive     * >=100,000 COLONIES/mL ESCHERICHIA COLI  SARS CORONAVIRUS 2 (TAT 6-24 HRS) Nasopharyngeal Nasopharyngeal Swab     Status: None   Collection Time: 03/14/19 12:14 AM   Specimen: Nasopharyngeal Swab  Result Value Ref Range Status   SARS Coronavirus 2 NEGATIVE NEGATIVE Final    Comment: (NOTE) SARS-CoV-2 target nucleic acids are NOT DETECTED. The SARS-CoV-2 RNA is generally detectable in upper and lower respiratory specimens during the acute phase of infection. Negative results do not preclude SARS-CoV-2 infection, do not rule out co-infections with other pathogens, and should not be used as the sole basis for treatment or other patient management decisions. Negative results must be  combined with clinical observations, patient history, and epidemiological information. The expected result is Negative. Fact Sheet for Patients: HairSlick.nohttps://www.fda.gov/media/138098/download Fact Sheet for Healthcare Providers: quierodirigir.comhttps://www.fda.gov/media/138095/download This test is not yet approved or cleared by the [place] FDA and  has been authorized for detection and/or diagnosis of SARS-CoV-2 by FDA under an Emergency Use Authorization (EUA). This EUA will remain  in effect (meaning this test can be used) for the duration of the COVID-19 declaration under Section 56 4(b)(1) of the Act, 21 U.S.C. section 360bbb-3(b)(1), unless the authorization is terminated or revoked sooner. Performed at [facility], [address] [place], [place] [address]      Labs: BNP (last 3 results) No results for input(s): BNP in the last 8760 hours. Basic Metabolic Panel: Recent Labs  Lab 03/14/19 0350 03/15/19 1158 03/16/19 0605 03/17/19 0614 03/19/19 0732  NA 141 138 139 140 137  K 4.1  3.9 3.0* 4.2 3.3*  CL 105 106 105 105 105  CO2 23 24 25 25  21*  GLUCOSE 157* 130* 113* 107* 113*  BUN 22 26* 20 16 12   CREATININE 2.09* 1.76* 1.50* 1.52* 1.58*  CALCIUM 9.0 9.0 8.7* 9.1 9.0  MG  --   --  1.6* 2.2  --    Liver Function Tests: Recent Labs  Lab 03/13/19 2346 03/14/19 0350 03/15/19 1158 03/16/19 0605  AST 34 29 66* 73*  ALT 19 17 28  42  ALKPHOS 73 68 76 69  BILITOT 1.0 1.3* 0.8 0.9  PROT 6.5 6.7 6.2* 6.9  ALBUMIN 3.2* 3.0* 2.8* 2.6*   No results for input(s): LIPASE, AMYLASE in the last 168 hours. No results for input(s): AMMONIA in the last 168 hours. CBC: Recent Labs  Lab 03/13/19 2103 03/14/19 0350 03/15/19 1158 03/16/19 0605 03/19/19 0732  WBC 19.1* 17.4* 14.1* 9.0 6.1  NEUTROABS 16.7*  --   --  7.6  --   HGB 12.8* 12.4* 12.2* 11.7* 12.3*  HCT 40.0 38.1* 35.8* 35.2* 37.4*  MCV 94.3 94.8 91.3 91.4 91.2  PLT 167 136* 131* 164 200   Cardiac Enzymes: Recent  Labs  Lab 03/15/19 1158 03/19/19 0732  CKTOTAL 783* 294   BNP: Invalid input(s): POCBNP CBG: Recent Labs  Lab 03/13/19 2159  GLUCAP 152*   D-Dimer No results for input(s): DDIMER in the last 72 hours. Hgb A1c No results for input(s): HGBA1C in the last 72 hours. Lipid Profile No results for input(s): CHOL, HDL, LDLCALC, TRIG, CHOLHDL, LDLDIRECT in the last 72 hours. Thyroid function studies No results for input(s): TSH, T4TOTAL, T3FREE, THYROIDAB in the last 72 hours.  Invalid input(s): FREET3 Anemia work up No results for input(s): VITAMINB12, FOLATE, FERRITIN, TIBC, IRON, RETICCTPCT in the last 72 hours. Urinalysis    Component Value Date/Time   COLORURINE AMBER (A) 03/14/2019 0850   APPEARANCEUR TURBID (A) 03/14/2019 0850   LABSPEC 1.018 03/14/2019 0850   PHURINE 6.0 03/14/2019 0850   GLUCOSEU NEGATIVE 03/14/2019 0850   HGBUR LARGE (A) 03/14/2019 0850   BILIRUBINUR NEGATIVE 03/14/2019 0850   KETONESUR NEGATIVE 03/14/2019 0850   PROTEINUR 100 (A) 03/14/2019 0850   UROBILINOGEN 1.0 08/11/2014 1306   NITRITE NEGATIVE 03/14/2019 0850   LEUKOCYTESUR MODERATE (A) 03/14/2019 0850   Sepsis Labs Invalid input(s): PROCALCITONIN,  WBC,  LACTICIDVEN Microbiology Recent Results (from the past 240 hour(s))  Urine culture     Status: Abnormal   Collection Time: 03/13/19  9:03 PM   Specimen: Urine, Random  Result Value Ref Range Status   Specimen Description URINE, RANDOM  Final   Special Requests   Final    NONE Performed at [facility], [address] [place], [facility] [address]    Culture >=100,000 COLONIES/mL ESCHERICHIA COLI (A)  Final   Report Status 03/16/2019 FINAL  Final   Organism ID, Bacteria ESCHERICHIA COLI (A)  Final      Susceptibility   Escherichia coli - MIC*    AMPICILLIN 4 SENSITIVE Sensitive     CEFAZOLIN <=4 SENSITIVE Sensitive     CEFTRIAXONE <=1 SENSITIVE Sensitive     CIPROFLOXACIN <=0.25 SENSITIVE Sensitive     GENTAMICIN <=1  SENSITIVE Sensitive     IMIPENEM <=0.25 SENSITIVE Sensitive     NITROFURANTOIN <=16 SENSITIVE Sensitive     TRIMETH/SULFA <=20 SENSITIVE Sensitive     AMPICILLIN/SULBACTAM <=2 SENSITIVE Sensitive     PIP/TAZO <=4 SENSITIVE Sensitive     Extended  ESBL NEGATIVE Sensitive     * >=100,000 COLONIES/mL ESCHERICHIA COLI  SARS CORONAVIRUS 2 (TAT 6-24 HRS) Nasopharyngeal Nasopharyngeal Swab     Status: None   Collection Time: 03/14/19 12:14 AM   Specimen: Nasopharyngeal Swab  Result Value Ref Range Status   SARS Coronavirus 2 NEGATIVE NEGATIVE Final    Comment: (NOTE) SARS-CoV-2 target nucleic acids are NOT DETECTED. The SARS-CoV-2 RNA is generally detectable in upper and lower respiratory specimens during the acute phase of infection. Negative results do not preclude SARS-CoV-2 infection, do not rule out co-infections with other pathogens, and should not be used as the sole basis for treatment or other patient management decisions. Negative results must be combined with clinical observations, patient history, and epidemiological information. The expected result is Negative. Fact Sheet for Patients: HairSlick.no Fact Sheet for Healthcare Providers: quierodirigir.com This test is not yet approved or cleared by the [place] FDA and  has been authorized for detection and/or diagnosis of SARS-CoV-2 by FDA under an Emergency Use Authorization (EUA). This EUA will remain  in effect (meaning this test can be used) for the duration of the COVID-19 declaration under Section 56 4(b)(1) of the Act, 21 U.S.C. section 360bbb-3(b)(1), unless the authorization is terminated or revoked sooner. Performed at [facility], [address] [place], [place] [address]      Time coordinating discharge: 35 minutes  SIGNED:   Lanae Boast, MD  [facility] 03/19/2019, 11:28 AM  If 7PM-7AM, please contact  night-coverage [email or web address]

## 2019-03-19 NOTE — TOC Progression Note (Addendum)
Transition of Care [facility]) - Progression Note    Patient Details  Name: Troy Gregory MRN: [id number] Date of Birth: 1936-03-04  [facility] CM/SW Contact  Zenon Mayo, RN Phone Number: 03/19/2019, 9:49 AM  Clinical Narrative:    NCM received consult for home with hospice,  NCM received call from Harmon Pier with [facility] stating she received information that patient wants home with hospice.  NCM  contacted wife, she states yes she would like home with hospice with [facility].  NCM called Harmon Pier back and stated yes this is correct she would like home with hospice with [facility].  She would like transport via [facility] at dc. Address confirmed. NCM called [employer] to cancel initiation of auth for SNF.  10/15 [facility], BSN - NCM spoke with wife , she states she does not want ambulance , she and her son will be coming to transport her husband home after the DME is delivered to her home.  She states they informed her they will be there in 20 minutes.    Expected Discharge Plan: Home w Hospice Care Barriers to Discharge: No Barriers Identified  Expected Discharge Plan and Services Expected Discharge Plan: [place] In-house Referral: Hospice / Palliative Care Discharge Planning Services: CM Consult Post Acute Care Choice: Hospice Living arrangements for the past 2 months: Single Family Home                 DME Arranged: (NA, hospice will supply DME)         HH Arranged: RN [place] Agency: Hospice and [place] Date [place]: 03/19/19 Time [place]: [phone] Representative spoke with at [place]: [place] ([place]) Interventions    Readmission Risk Interventions No flowsheet data found.

## 2019-05-05 DEATH — deceased
